# Patient Record
Sex: Female | Born: 1945 | Race: White | Hispanic: No | Smoking: Never smoker
Health system: Southern US, Community
[De-identification: ages and names within clinical notes are randomized; demographics above are authoritative.]

## PROBLEM LIST (undated history)

## (undated) DIAGNOSIS — K219 Gastro-esophageal reflux disease without esophagitis: Secondary | ICD-10-CM

## (undated) DIAGNOSIS — G8929 Other chronic pain: Secondary | ICD-10-CM

## (undated) DIAGNOSIS — I10 Essential (primary) hypertension: Secondary | ICD-10-CM

## (undated) HISTORY — PX: MASTECTOMY: SHX3

## (undated) HISTORY — PX: EYE SURGERY: SHX253

## (undated) HISTORY — PX: SHOULDER SURGERY: SHX246

## (undated) HISTORY — PX: BACK SURGERY: SHX140

## (undated) HISTORY — PX: REPLACEMENT TOTAL KNEE BILATERAL: SUR1225

---

## 2018-11-20 ENCOUNTER — Encounter: Payer: Self-pay | Admitting: Emergency Medicine

## 2018-11-20 ENCOUNTER — Ambulatory Visit
Admission: EM | Admit: 2018-11-20 | Discharge: 2018-11-20 | Disposition: A | Discharge status: Home / Self Care | Payer: Medicare Other | Attending: Family Medicine | Admitting: Family Medicine

## 2018-11-20 ENCOUNTER — Other Ambulatory Visit: Payer: Self-pay

## 2018-11-20 DIAGNOSIS — J0100 Acute maxillary sinusitis, unspecified: Secondary | ICD-10-CM

## 2018-11-20 DIAGNOSIS — Z881 Allergy status to other antibiotic agents status: Secondary | ICD-10-CM | POA: Diagnosis not present

## 2018-11-20 DIAGNOSIS — Z79899 Other long term (current) drug therapy: Secondary | ICD-10-CM | POA: Diagnosis not present

## 2018-11-20 DIAGNOSIS — I10 Essential (primary) hypertension: Secondary | ICD-10-CM | POA: Insufficient documentation

## 2018-11-20 DIAGNOSIS — J0110 Acute frontal sinusitis, unspecified: Secondary | ICD-10-CM | POA: Diagnosis not present

## 2018-11-20 DIAGNOSIS — Z96653 Presence of artificial knee joint, bilateral: Secondary | ICD-10-CM | POA: Diagnosis not present

## 2018-11-20 DIAGNOSIS — Z888 Allergy status to other drugs, medicaments and biological substances status: Secondary | ICD-10-CM | POA: Diagnosis not present

## 2018-11-20 DIAGNOSIS — R519 Headache, unspecified: Secondary | ICD-10-CM | POA: Diagnosis present

## 2018-11-20 DIAGNOSIS — Z20828 Contact with and (suspected) exposure to other viral communicable diseases: Secondary | ICD-10-CM | POA: Diagnosis not present

## 2018-11-20 DIAGNOSIS — Z8249 Family history of ischemic heart disease and other diseases of the circulatory system: Secondary | ICD-10-CM | POA: Diagnosis not present

## 2018-11-20 HISTORY — DX: Gastro-esophageal reflux disease without esophagitis: K21.9

## 2018-11-20 HISTORY — DX: Essential (primary) hypertension: I10

## 2018-11-20 HISTORY — DX: Other chronic pain: G89.29

## 2018-11-20 MED ORDER — AMOXICILLIN 875 MG PO TABS: 875.0000 mg | ORAL_TABLET | Freq: Two times a day (BID) | ORAL | 0 refills | Status: DC

## 2018-11-20 MED ORDER — BENZONATATE 200 MG PO CAPS: 200.0000 mg | ORAL_CAPSULE | Freq: Three times a day (TID) | ORAL | 0 refills | Status: DC | PRN

## 2018-11-20 NOTE — ED Triage Notes (Signed)
Patient c/o cough and chest congestion and HAs for a week.  Patient denies fevers.

## 2018-11-20 NOTE — ED Provider Notes (Signed)
[facility]    CSN: [id number] Arrival date & time: 11/20/18  1059      History   Chief Complaint Chief Complaint  Patient presents with  . Cough  . Headache    HPI Marilyn Keith is a 73 y.o. female.   73 yo female with a c/o sinus pressure, sinus headaches for one to two weeks and slight cough. Denies any fevers, chills, chest pains, shortness of breath, wheezing.      Past Medical History:  Diagnosis Date  . Chronic back pain   . GERD (gastroesophageal reflux disease)   . Hypertension     There are no active problems to display for this patient.   Past Surgical History:  Procedure Laterality Date  . BACK SURGERY    . MASTECTOMY    . REPLACEMENT TOTAL KNEE BILATERAL    . SHOULDER SURGERY Right     OB History   No obstetric history on file.      Home Medications    Prior to Admission medications   Medication Sig Start Date End Date Taking? Authorizing Provider  aspirin-acetaminophen-caffeine (EXCEDRIN MIGRAINE) [id number] MG tablet Take by mouth.   Yes [provider]  Dexlansoprazole 30 MG capsule Take by mouth.   Yes [provider]  DULoxetine (CYMBALTA) 60 MG capsule TAKE ONE CAPSULE BY MOUTH EVERY DAY 01/18/16  Yes [provider]  losartan (COZAAR) 50 MG tablet TAKE 1 TABLET BY MOUTH EVERY DAY 01/17/16  Yes [provider]  OXYGEN Inhale 1 L into the lungs every evening.   Yes [provider]  pregabalin (LYRICA) 100 MG capsule TAKE ONE CAPSULE BY MOUTH 3 TIMES A DAY 11/01/15  Yes [provider]  amoxicillin (AMOXIL) 875 MG tablet Take 1 tablet (875 mg total) by mouth 2 (two) times daily. 11/20/18   Norval Gable, MD  benzonatate (TESSALON) 200 MG capsule Take 1 capsule (200 mg total) by mouth 3 (three) times daily as needed. 11/20/18   Norval Gable, MD    Family History Family History  Problem Relation Age of Onset  . Heart disease Father     Social History Social History   Tobacco Use  . Smoking status: Never Smoker  . Smokeless tobacco: Never Used  Substance Use Topics  . Alcohol use: Yes  . Drug use: Never     Allergies   Pregabalin and Ciprofloxacin   Review of Systems Review of Systems   Physical Exam Triage Vital Signs ED Triage Vitals  Enc Vitals Group     BP 11/20/18 1117 122/73     Pulse Rate 11/20/18 1117 85     Resp 11/20/18 1117 16     Temp 11/20/18 1117 98.5 F (36.9 C)     Temp Source 11/20/18 1117 Oral     SpO2 11/20/18 1117 98 %     Weight 11/20/18 1111 177 lb (80.3 kg)     Height 11/20/18 1111 4\' 11"  (1.499 m)     Head Circumference --      Peak Flow --      Pain Score 11/20/18 1111 3     Pain Loc --      Pain Edu? --      Excl. in [place]? --    No data found.  Updated Vital Signs BP 122/73 (BP Location: Left Arm)   Pulse 85   Temp 98.5 F (36.9 C) (Oral)   Resp 16   Ht 4\' 11"  (1.499 m)   Wt  80.3 kg   SpO2 98%   BMI 35.75 kg/m   Visual Acuity Right Eye Distance:   Left Eye Distance:   Bilateral Distance:    Right Eye Near:   Left Eye Near:    Bilateral Near:     Physical Exam Vitals signs and nursing note reviewed.  Constitutional:      General: She is not in acute distress.    Appearance: She is not toxic-appearing or diaphoretic.  HENT:     Nose: Congestion present.     Right Sinus: Maxillary sinus tenderness and frontal sinus tenderness present.     Left Sinus: Maxillary sinus tenderness and frontal sinus tenderness present.  Cardiovascular:     Rate and Rhythm: Normal rate.  Pulmonary:     Effort: Pulmonary effort is normal. No respiratory distress.  Neurological:     Mental Status: She is alert.      UC Treatments / Results  Labs (all labs ordered are listed, but only abnormal results are displayed) Labs Reviewed  NOVEL CORONAVIRUS, NAA (HOSPITAL ORDER, SEND-OUT TO REF LAB)    EKG   Radiology No results found.  Procedures Procedures (including critical care time)   Medications Ordered in UC Medications - No data to display  Initial Impression / Assessment and Plan / UC Course  I have reviewed the triage vital signs and the nursing notes.  Pertinent labs & imaging results that were available during my care of the patient were reviewed by me and considered in my medical decision making (see chart for details).      Final Clinical Impressions(s) / UC Diagnoses   Final diagnoses:  Acute maxillary sinusitis, recurrence not specified  Acute frontal sinusitis, recurrence not specified     Discharge Instructions     Rest, fluids, over the counter tylenol/advil as needed Await test result    ED Prescriptions    Medication Sig Dispense Auth. Provider   benzonatate (TESSALON) 200 MG capsule Take 1 capsule (200 mg total) by mouth 3 (three) times daily as needed. 30 capsule Payton Mccallum, MD   amoxicillin (AMOXIL) 875 MG tablet Take 1 tablet (875 mg total) by mouth 2 (two) times daily. 20 tablet Payton Mccallum, MD      1.  diagnosis reviewed with patient 2. rx as per orders above; reviewed possible side effects, interactions, risks and benefits  3. Recommend supportive treatment as above 4. Follow-up prn if symptoms worsen or don't improve PDMP not reviewed this encounter.   Payton Mccallum, MD 12/03/18 1134

## 2018-11-20 NOTE — Discharge Instructions (Signed)
Rest, fluids, over the counter tylenol/advil as needed °Await test result °

## 2018-11-21 LAB — NOVEL CORONAVIRUS, NAA (HOSP ORDER, SEND-OUT TO REF LAB; TAT 18-24 HRS): SARS-CoV-2, NAA: NOT DETECTED

## 2018-12-14 ENCOUNTER — Encounter: Payer: Self-pay | Admitting: Emergency Medicine

## 2018-12-14 ENCOUNTER — Ambulatory Visit
Admission: EM | Admit: 2018-12-14 | Discharge: 2018-12-14 | Disposition: A | Discharge status: Home / Self Care | Payer: Medicare Other | Attending: Emergency Medicine | Admitting: Emergency Medicine

## 2018-12-14 ENCOUNTER — Other Ambulatory Visit: Payer: Self-pay

## 2018-12-14 DIAGNOSIS — R319 Hematuria, unspecified: Secondary | ICD-10-CM | POA: Diagnosis present

## 2018-12-14 DIAGNOSIS — N390 Urinary tract infection, site not specified: Secondary | ICD-10-CM | POA: Diagnosis present

## 2018-12-14 LAB — URINALYSIS, COMPLETE (UACMP) WITH MICROSCOPIC
Bilirubin Urine: NEGATIVE
Glucose, UA: NEGATIVE mg/dL
Nitrite: POSITIVE — AB
Protein, ur: 30 mg/dL — AB
Specific Gravity, Urine: 1.025 (ref 1.005–1.030)
WBC, UA: >50 WBC/hpf (ref 0–5)
pH: 6 (ref 5.0–8.0)

## 2018-12-14 MED ORDER — CEPHALEXIN 500 MG PO CAPS: 500.0000 mg | ORAL_CAPSULE | Freq: Two times a day (BID) | ORAL | 0 refills | Status: AC

## 2018-12-14 NOTE — Discharge Instructions (Addendum)
Take medication as prescribed. Rest. Drink plenty of fluids.  ° °Follow up with your primary care physician this week as needed. Return to Urgent care for new or worsening concerns.  ° °

## 2018-12-14 NOTE — ED Triage Notes (Signed)
Patient in today c/o urinary frequency and burning x 4 days. Patient denies vaginal discharge. Patient denies fever.

## 2018-12-14 NOTE — ED Provider Notes (Signed)
[facility] ____________________________________________  Time seen: Approximately 11:18 AM  I have reviewed the triage vital signs and the nursing notes.   HISTORY  Chief Complaint Dysuria   HPI Marilyn Keith is a 73 y.o. female presenting for evaluation of 4 days of urinary frequency, urgency and burning with urination.  States feels consistent with previous urinary infections.  Has been trying over-the-counter vitamin C and cranberry juice without resolution.  Denies abdominal pain, flank pain, fevers, vomiting, diarrhea or known triggers.  Reports otherwise doing well.  Denies aggravating alleviating factors.  Denies renal insufficiency history.   Past Medical History:  Diagnosis Date  . Chronic back pain   . GERD (gastroesophageal reflux disease)   . Hypertension     There are no active problems to display for this patient.   Past Surgical History:  Procedure Laterality Date  . BACK SURGERY    . MASTECTOMY    . REPLACEMENT TOTAL KNEE BILATERAL    . SHOULDER SURGERY Right      No current facility-administered medications for this encounter.   Current Outpatient Medications:  .  aspirin-acetaminophen-caffeine (EXCEDRIN MIGRAINE) 250-250-65 MG tablet, Take by mouth., Disp: , Rfl:  .  Dexlansoprazole 30 MG capsule, Take by mouth., Disp: , Rfl:  .  diphenhydrAMINE (BENADRYL) 25 MG tablet, Take 25 mg by mouth daily as needed., Disp: , Rfl:  .  DULoxetine (CYMBALTA) 60 MG capsule, TAKE ONE CAPSULE BY MOUTH EVERY DAY, Disp: , Rfl:  .  losartan (COZAAR) 50 MG tablet, TAKE 1 TABLET BY MOUTH EVERY DAY, Disp: , Rfl:  .  OXYGEN, Inhale 1 L into the lungs every evening., Disp: , Rfl:  .  pregabalin (LYRICA) 100 MG capsule, TAKE ONE CAPSULE BY MOUTH 3 TIMES A DAY, Disp: , Rfl:  .  cephALEXin (KEFLEX) 500 MG capsule, Take 1 capsule (500 mg total) by mouth 2 (two) times daily for 7 days., Disp: 14 capsule, Rfl: 0  Allergies Pregabalin and Ciprofloxacin  Family  History  Problem Relation Age of Onset  . Other Mother        septic from bladder infection  . Heart disease Father   . Diabetes Father     Social History Social History   Tobacco Use  . Smoking status: Never Smoker  . Smokeless tobacco: Never Used  Substance Use Topics  . Alcohol use: Yes    Comment: rarely  . Drug use: Never    Review of Systems Constitutional: No fever ENT: No sore throat. Cardiovascular: Denies chest pain. Respiratory: Denies shortness of breath. Gastrointestinal: No abdominal pain.  No nausea, no vomiting.  No diarrhea.  Genitourinary: Positive for dysuria. Musculoskeletal: Negative for back pain. Skin: Negative for rash.   ____________________________________________   PHYSICAL EXAM:  VITAL SIGNS: ED Triage Vitals  Enc Vitals Group     BP 12/14/18 1040 136/72     Pulse Rate 12/14/18 1040 88     Resp 12/14/18 1040 18     Temp 12/14/18 1040 97.9 F (36.6 C)     Temp Source 12/14/18 1040 Oral     SpO2 12/14/18 1040 99 %     Weight 12/14/18 1041 176 lb (79.8 kg)     Height 12/14/18 1041 4\' 11"  (1.499 m)     Head Circumference --      Peak Flow --      Pain Score 12/14/18 1040 8     Pain Loc --      Pain Edu? --  Excl. in [place]? --     Constitutional: Alert and oriented. Well appearing and in no acute distress. Eyes: Conjunctivae are normal.  ENT      Head: Normocephalic and atraumatic. Cardiovascular: Normal rate, regular rhythm. Grossly normal heart sounds.  Good peripheral circulation. Respiratory: Normal respiratory effort without tachypnea nor retractions. Breath sounds are clear and equal bilaterally. No wheezes, rales, rhonchi. Gastrointestinal: Soft and nontender.  No CVA tenderness. Musculoskeletal:  Steady gait.  Neurologic:  Normal speech and language. Speech is normal. No gait instability.  Skin:  Skin is warm, dry and intact. No rash noted. Psychiatric: Mood and affect are normal. Speech and behavior are normal. Patient  exhibits appropriate insight and judgment   ___________________________________________   LABS (all labs ordered are listed, but only abnormal results are displayed)  Labs Reviewed  URINALYSIS, COMPLETE (UACMP) WITH MICROSCOPIC - Abnormal; Notable for the following components:      Result Value   APPearance CLOUDY (*)    Hgb urine dipstick MODERATE (*)    Ketones, ur TRACE (*)    Protein, ur 30 (*)    Nitrite POSITIVE (*)    Leukocytes,Ua SMALL (*)    Bacteria, UA MANY (*)    All other components within normal limits  URINE CULTURE   ____________________________________________  PROCEDURES Procedures    INITIAL IMPRESSION / ASSESSMENT AND PLAN / ED COURSE  Pertinent labs & imaging results that were available during my care of the patient were reviewed by me and considered in my medical decision making (see chart for details).  Well appearing.  No acute distress.  Urinalysis reviewed, UTI.  Will treat with oral Keflex.  Encourage rest, fluids and supportive care.Discussed indication, risks and benefits of medications with patient.   Discussed follow up with Primary care physician this week. Discussed follow up and return parameters including no resolution or any worsening concerns. Patient verbalized understanding and agreed to plan.   ____________________________________________   FINAL CLINICAL IMPRESSION(S) / ED DIAGNOSES  Final diagnoses:  Urinary tract infection with hematuria, site unspecified     ED Discharge Orders         Ordered    cephALEXin (KEFLEX) 500 MG capsule  2 times daily     12/14/18 1122           Note: This dictation was prepared with Dragon dictation along with smaller phrase technology. Any transcriptional errors that result from this process are unintentional.         Marylene Land, NP 12/14/18 1236

## 2018-12-17 LAB — URINE CULTURE: Culture: >=100000 — AB

## 2019-02-07 ENCOUNTER — Other Ambulatory Visit: Payer: Self-pay

## 2019-02-07 ENCOUNTER — Ambulatory Visit
Admission: EM | Admit: 2019-02-07 | Discharge: 2019-02-07 | Disposition: A | Discharge status: Home / Self Care | Payer: Medicare Other | Attending: Family Medicine | Admitting: Family Medicine

## 2019-02-07 DIAGNOSIS — T148XXA Other injury of unspecified body region, initial encounter: Secondary | ICD-10-CM | POA: Diagnosis not present

## 2019-02-07 NOTE — ED Triage Notes (Signed)
Pt presents with c/o bruise to inner and outer aspect of right leg. She denies any known injury/fall. She did have knee surgery 2008(?). She is not sure what could have happened. She does have abraded area around the outside of her right ankle, she states due to itching/scratching and rashes she gets. She reports the leg is "uncomfortable" but denies pain. She was having some muscle spasms in that leg yesterday.

## 2019-02-07 NOTE — Discharge Instructions (Addendum)
Monitor for any new symptoms and follow up as needed

## 2019-02-09 NOTE — ED Provider Notes (Signed)
[facility]    CSN: [id number] Arrival date & time: 02/07/19  1352      History   Chief Complaint Chief Complaint  Patient presents with  . Bruising    HPI Marilyn Keith is a 74 y.o. female.   74 yo female with a c/o bruising to her right leg for the past 4-5 days. Denies any pain, falls or other traumatic injury, swelling, fevers, chills, chest pains or shortness of breath. Reports she takes excedrin/aspirin daily and the only thing she can think of was massaging some lotion on her legs prior to symptoms developing.      Past Medical History:  Diagnosis Date  . Chronic back pain   . GERD (gastroesophageal reflux disease)   . Hypertension     There are no problems to display for this patient.   Past Surgical History:  Procedure Laterality Date  . BACK SURGERY    . MASTECTOMY    . REPLACEMENT TOTAL KNEE BILATERAL    . SHOULDER SURGERY Right     OB History   No obstetric history on file.      Home Medications    Prior to Admission medications   Medication Sig Start Date End Date Taking? Authorizing Provider  aspirin-acetaminophen-caffeine (EXCEDRIN MIGRAINE) [id number] MG tablet Take by mouth.   Yes [provider]  Dexlansoprazole 30 MG capsule Take by mouth.   Yes [provider]  diphenhydrAMINE (BENADRYL) 25 MG tablet Take 25 mg by mouth daily as needed.   Yes [provider]  DULoxetine (CYMBALTA) 60 MG capsule TAKE ONE CAPSULE BY MOUTH EVERY DAY 01/18/16  Yes [provider]  losartan (COZAAR) 50 MG tablet TAKE 1 TABLET BY MOUTH EVERY DAY 01/17/16  Yes [provider]  OXYGEN Inhale 1 L into the lungs every evening.   Yes [provider]  pregabalin (LYRICA) 100 MG capsule TAKE ONE CAPSULE BY MOUTH 3 TIMES A DAY 11/01/15  Yes [provider]    Family History Family History  Problem Relation Age of Onset  . Other Mother        septic from bladder infection  . Heart disease  Father   . Diabetes Father     Social History Social History   Tobacco Use  . Smoking status: Never Smoker  . Smokeless tobacco: Never Used  Substance Use Topics  . Alcohol use: Yes    Comment: rarely  . Drug use: Never     Allergies   Pregabalin and Ciprofloxacin   Review of Systems Review of Systems   Physical Exam Triage Vital Signs ED Triage Vitals  Enc Vitals Group     BP 02/07/19 1411 114/79     Pulse Rate 02/07/19 1411 89     Resp --      Temp 02/07/19 1411 98.4 F (36.9 C)     Temp Source 02/07/19 1411 Oral     SpO2 02/07/19 1411 100 %     Weight 02/07/19 1409 175 lb (79.4 kg)     Height 02/07/19 1409 4\' 11"  (1.499 m)     Head Circumference --      Peak Flow --      Pain Score 02/07/19 1408 5     Pain Loc --      Pain Edu? --      Excl. in [place]? --    No data found.  Updated Vital Signs BP 114/79 (BP Location: Left Arm)   Pulse 89  Temp 98.4 F (36.9 C) (Oral)   Ht 4\' 11"  (1.499 m)   Wt 79.4 kg   SpO2 100%   BMI 35.35 kg/m   Visual Acuity Right Eye Distance:   Left Eye Distance:   Bilateral Distance:    Right Eye Near:   Left Eye Near:    Bilateral Near:     Physical Exam Vitals and nursing note reviewed.  Constitutional:      General: She is not in acute distress.    Appearance: She is not toxic-appearing or diaphoretic.  Cardiovascular:     Rate and Rhythm: Normal rate.  Pulmonary:     Effort: Pulmonary effort is normal. No respiratory distress.  Musculoskeletal:        General: No tenderness.     Comments: Right lower extremity neurovascularly intact; 2+ pulses; skin on medial lower thigh and knee area with mild ecchymosis noted; no tenderness  Neurological:     Mental Status: She is alert.      UC Treatments / Results  Labs (all labs ordered are listed, but only abnormal results are displayed) Labs Reviewed - No data to display  EKG   Radiology No results found.  Procedures Procedures (including critical care  time)  Medications Ordered in UC Medications - No data to display  Initial Impression / Assessment and Plan / UC Course  I have reviewed the triage vital signs and the nursing notes.  Pertinent labs & imaging results that were available during my care of the patient were reviewed by me and considered in my medical decision making (see chart for details).      Final Clinical Impressions(s) / UC Diagnoses   Final diagnoses:  Bruising     Discharge Instructions     Monitor for any new symptoms and follow up as needed    ED Prescriptions    None     1. diagnosis reviewed with patient 2. Recommend supportive treatment; monitor 3. Follow-up prn if symptoms worsen or don't improve  PDMP not reviewed this encounter.   , MD 02/09/19 1000

## 2020-05-17 ENCOUNTER — Ambulatory Visit
Admission: EM | Admit: 2020-05-17 | Discharge: 2020-05-17 | Disposition: A | Discharge status: Home / Self Care | Payer: Medicare Other | Attending: Sports Medicine | Admitting: Sports Medicine

## 2020-05-17 ENCOUNTER — Other Ambulatory Visit: Payer: Self-pay

## 2020-05-17 ENCOUNTER — Ambulatory Visit (INDEPENDENT_AMBULATORY_CARE_PROVIDER_SITE_OTHER): Payer: Medicare Other

## 2020-05-17 DIAGNOSIS — Z96653 Presence of artificial knee joint, bilateral: Secondary | ICD-10-CM | POA: Insufficient documentation

## 2020-05-17 DIAGNOSIS — J019 Acute sinusitis, unspecified: Secondary | ICD-10-CM | POA: Insufficient documentation

## 2020-05-17 DIAGNOSIS — R059 Cough, unspecified: Secondary | ICD-10-CM | POA: Insufficient documentation

## 2020-05-17 DIAGNOSIS — Z20822 Contact with and (suspected) exposure to covid-19: Secondary | ICD-10-CM | POA: Diagnosis not present

## 2020-05-17 DIAGNOSIS — Z881 Allergy status to other antibiotic agents status: Secondary | ICD-10-CM | POA: Diagnosis not present

## 2020-05-17 DIAGNOSIS — Z79899 Other long term (current) drug therapy: Secondary | ICD-10-CM | POA: Insufficient documentation

## 2020-05-17 DIAGNOSIS — J45909 Unspecified asthma, uncomplicated: Secondary | ICD-10-CM | POA: Diagnosis not present

## 2020-05-17 DIAGNOSIS — R0602 Shortness of breath: Secondary | ICD-10-CM

## 2020-05-17 MED ORDER — IPRATROPIUM BROMIDE 0.06 % NA SOLN: 2.0000 | Freq: Four times a day (QID) | NASAL | 12 refills | Status: AC

## 2020-05-17 MED ORDER — AMOXICILLIN-POT CLAVULANATE 875-125 MG PO TABS: 1.0000 | ORAL_TABLET | Freq: Two times a day (BID) | ORAL | 0 refills | Status: AC

## 2020-05-17 MED ORDER — BENZONATATE 200 MG PO CAPS: 200.0000 mg | ORAL_CAPSULE | Freq: Three times a day (TID) | ORAL | 0 refills | Status: AC | PRN

## 2020-05-17 NOTE — ED Triage Notes (Signed)
Pt reports having productive cough x2 weeks. sts she have been feeling SOB. Also reports having nasal pressure and headache.

## 2020-05-17 NOTE — Discharge Instructions (Addendum)
The chest x-ray is negative for pneumonia.  Your vital signs are stable.  Your oxygen is normal and your chest is clear when I listen to.  I suspect that you have a viral infection and there is a possibility that you have a secondary sinus infection since her symptoms have been ongoing for 2 weeks.  We can try an antibiotic at this time, medication for cough and a nasal spray.  Make sure to rest and increase fluids.  You should be seen again either by your PCP or here or ED if you develop any fever, worsening cough or breathing difficulty.  Also, use your albuterol inhaler if you are feeling short of breath.  You have received COVID testing today either for positive exposure, concerning symptoms that could be related to COVID infection, screening purposes, or re-testing after confirmed positive.  Your test obtained today checks for active viral infection in the last 1-2 weeks. If your test is negative now, you can still test positive later. So, if you do develop symptoms you should either get re-tested and/or isolate x 5 days and then strict mask use x 5 days (unvaccinated) or mask use x 10 days (vaccinated). Please follow CDC guidelines.  While Rapid antigen tests come back in 15-20 minutes, send out PCR/molecular test results typically come back within 1-3 days. In the mean time, if you are symptomatic, assume this could be a positive test and treat/monitor yourself as if you do have COVID.   We will call with test results if positive. Please download the MyChart app and set up a profile to access test results.   If symptomatic, go home and rest. Push fluids. Take Tylenol as needed for discomfort. Gargle warm salt water. Throat lozenges. Take Mucinex DM or Robitussin for cough. Humidifier in bedroom to ease coughing. Warm showers. Also review the COVID handout for more information.  COVID-19 INFECTION: The incubation period of COVID-19 is approximately 14 days after exposure, with most symptoms  developing in roughly 4-5 days. Symptoms may range in severity from mild to critically severe. Roughly 80% of those infected will have mild symptoms. People of any age may become infected with COVID-19 and have the ability to transmit the virus. The most common symptoms include: fever, fatigue, cough, body aches, headaches, sore throat, nasal congestion, shortness of breath, nausea, vomiting, diarrhea, changes in smell and/or taste.    COURSE OF ILLNESS Some patients may begin with mild disease which can progress quickly into critical symptoms. If your symptoms are worsening please call ahead to the Emergency Department and proceed there for further treatment. Recovery time appears to be roughly 1-2 weeks for mild symptoms and 3-6 weeks for severe disease.   GO IMMEDIATELY TO ER FOR FEVER YOU ARE UNABLE TO GET DOWN WITH TYLENOL, BREATHING PROBLEMS, CHEST PAIN, FATIGUE, LETHARGY, INABILITY TO EAT OR DRINK, ETC  QUARANTINE AND ISOLATION: To help decrease the spread of COVID-19 please remain isolated if you have COVID infection or are highly suspected to have COVID infection. This means -stay home and isolate to one room in the home if you live with others. Do not share a bed or bathroom with others while ill, sanitize and wipe down all countertops and keep common areas clean and disinfected. Stay home for 5 days. If you have no symptoms or your symptoms are resolving after 5 days, you can leave your house. Continue to wear a mask around others for 5 additional days. If you have been in close contact (within  6 feet) of someone diagnosed with COVID 19, you are advised to quarantine in your home for 14 days as symptoms can develop anywhere from 2-14 days after exposure to the virus. If you develop symptoms, you  must isolate.  Most current guidelines for COVID after exposure -unvaccinated: isolate 5 days and strict mask use x 5 days. Test on day 5 is possible -vaccinated: wear mask x 10 days if symptoms do not  develop -You do not necessarily need to be tested for COVID if you have + exposure and  develop symptoms. Just isolate at home x10 days from symptom onset During this global pandemic, CDC advises to practice social distancing, try to stay at least 63ft away from others at all times. Wear a face covering. Wash and sanitize your hands regularly and avoid going anywhere that is not necessary.  KEEP IN MIND THAT THE COVID TEST IS NOT 100% ACCURATE AND YOU SHOULD STILL DO EVERYTHING TO PREVENT POTENTIAL SPREAD OF VIRUS TO OTHERS (WEAR MASK, WEAR GLOVES, WASH HANDS AND SANITIZE REGULARLY). IF INITIAL TEST IS NEGATIVE, THIS MAY NOT MEAN YOU ARE DEFINITELY NEGATIVE. MOST ACCURATE TESTING IS DONE 5-7 DAYS AFTER EXPOSURE.   It is not advised by CDC to get re-tested after receiving a positive COVID test since you can still test positive for weeks to months after you have already cleared the virus.   *If you have not been vaccinated for COVID, I strongly suggest you consider getting vaccinated as long as there are no contraindications.

## 2020-05-17 NOTE — ED Provider Notes (Signed)
[facility]    CSN: [id number] Arrival date & time: 05/17/20  1042      History   Chief Complaint Chief Complaint  Patient presents with  . Cough    HPI Marilyn Keith is a 75 y.o. female presenting for 2-week history of fatigue, productive cough, nasal congestion, headaches and sinus pressure.  Patient also states that she has some shortness of breath which is worse whenever she exerts herself.  No chest pain.  No fevers.  Patient states that she believes she picked up an illness from her grandson who was sick around the same time.  She says he is better but she is not.  She states she does not feel as bad as she did at the start but she still feels unwell.  Patient has not been evaluated for the symptoms yet.  She has not been taking any OTC medications for symptoms.  She has no history of asthma, COPD or severe allergies.  She does see a pulmonologist for reactive airway disease associated with severe GERD and obstructive sleep apnea.  Patient has albuterol inhaler to use as needed but she says she has not used it.  No known COVID exposure and patient vaccinated for COVID-19 x3.  No other concerns.  HPI  Past Medical History:  Diagnosis Date  . Chronic back pain   . GERD (gastroesophageal reflux disease)   . Hypertension     There are no problems to display for this patient.   Past Surgical History:  Procedure Laterality Date  . BACK SURGERY    . EYE SURGERY    . MASTECTOMY    . REPLACEMENT TOTAL KNEE BILATERAL    . SHOULDER SURGERY Right     OB History   No obstetric history on file.      Home Medications    Prior to Admission medications   Medication Sig Start Date End Date Taking? Authorizing Provider  amoxicillin-clavulanate (AUGMENTIN) 875-125 MG tablet Take 1 tablet by mouth every 12 (twelve) hours for 7 days. 05/17/20 05/24/20 Yes Shirlee Latch, PA-C  benzonatate (TESSALON) 200 MG capsule Take 1 capsule (200 mg total) by mouth 3 (three) times  daily as needed for up to 7 days for cough. 05/17/20 05/24/20 Yes Eusebio Friendly B, PA-C  ipratropium (ATROVENT) 0.06 % nasal spray Place 2 sprays into both nostrils 4 (four) times daily. 05/17/20  Yes Shirlee Latch, PA-C  aspirin-acetaminophen-caffeine (EXCEDRIN MIGRAINE) [phone] MG tablet Take by mouth.    [provider]  Dexlansoprazole 30 MG capsule Take by mouth.    [provider]  diphenhydrAMINE (BENADRYL) 25 MG tablet Take 25 mg by mouth daily as needed.    [provider]  DULoxetine (CYMBALTA) 60 MG capsule TAKE ONE CAPSULE BY MOUTH EVERY DAY 01/18/16   [provider]  losartan (COZAAR) 50 MG tablet TAKE 1 TABLET BY MOUTH EVERY DAY 01/17/16   [provider]  OXYGEN Inhale 1 L into the lungs every evening.    [provider]  pregabalin (LYRICA) 100 MG capsule TAKE ONE CAPSULE BY MOUTH 3 TIMES A DAY 11/01/15   [provider]    Family History Family History  Problem Relation Age of Onset  . Other Mother        septic from bladder infection  . Heart disease Father   . Diabetes Father     Social History Social History   Tobacco Use  . Smoking status: Never Smoker  . Smokeless  tobacco: Never Used  Vaping Use  . Vaping Use: Never used  Substance Use Topics  . Alcohol use: Yes    Comment: rarely  . Drug use: Never     Allergies   Pregabalin and Ciprofloxacin   Review of Systems Review of Systems  Constitutional: Negative for chills, diaphoresis, fatigue and fever.  HENT: Positive for congestion, rhinorrhea, sinus pressure and sinus pain. Negative for ear pain and sore throat.   Respiratory: Positive for cough and shortness of breath. Negative for wheezing.   Cardiovascular: Negative for chest pain.  Gastrointestinal: Negative for abdominal pain, nausea and vomiting.  Musculoskeletal: Negative for arthralgias and myalgias.  Skin: Negative for rash.  Neurological: Positive for headaches. Negative for  weakness.  Hematological: Negative for adenopathy.     Physical Exam Triage Vital Signs ED Triage Vitals  Enc Vitals Group     BP 05/17/20 1051 (!) 150/89     Pulse Rate 05/17/20 1051 96     Resp 05/17/20 1051 20     Temp 05/17/20 1051 97.9 F (36.6 C)     Temp Source 05/17/20 1051 Oral     SpO2 05/17/20 1051 99 %     Weight 05/17/20 1052 180 lb (81.6 kg)     Height 05/17/20 1052 4\' 11"  (1.499 m)     Head Circumference --      Peak Flow --      Pain Score 05/17/20 1052 2     Pain Loc --      Pain Edu? --      Excl. in GC? --    No data found.  Updated Vital Signs BP (!) 150/89   Pulse 96   Temp 97.9 F (36.6 C) (Oral)   Resp 20   Ht 4\' 11"  (1.499 m)   Wt 180 lb (81.6 kg)   SpO2 99%   BMI 36.36 kg/m       Physical Exam Vitals and nursing note reviewed.  Constitutional:      General: She is not in acute distress.    Appearance: Normal appearance. She is not ill-appearing or toxic-appearing.  HENT:     Head: Normocephalic and atraumatic.     Right Ear: Tympanic membrane, ear canal and external ear normal.     Left Ear: Tympanic membrane, ear canal and external ear normal.     Nose: Congestion and rhinorrhea present.     Mouth/Throat:     Mouth: Mucous membranes are moist.     Pharynx: Oropharynx is clear. No posterior oropharyngeal erythema.  Eyes:     General: No scleral icterus.       Right eye: No discharge.        Left eye: No discharge.     Conjunctiva/sclera: Conjunctivae normal.  Cardiovascular:     Rate and Rhythm: Normal rate and regular rhythm.     Heart sounds: Normal heart sounds.  Pulmonary:     Effort: Pulmonary effort is normal. No respiratory distress.     Breath sounds: Normal breath sounds. No wheezing, rhonchi or rales.  Musculoskeletal:     Cervical back: Neck supple.  Skin:    General: Skin is dry.  Neurological:     General: No focal deficit present.     Mental Status: She is alert. Mental status is at baseline.     Motor: No  weakness.     Gait: Gait normal.  Psychiatric:        Mood and Affect: Mood normal.  Behavior: Behavior normal.        Thought Content: Thought content normal.      UC Treatments / Results  Labs (all labs ordered are listed, but only abnormal results are displayed) Labs Reviewed  SARS CORONAVIRUS 2 (TAT 6-24 HRS)    EKG   Radiology DG Chest 2 View  Addendum Date: 05/17/2020   ADDENDUM REPORT: 05/17/2020 12:10 ADDENDUM: The impression should read as follows: No acute abnormality. Electronically Signed   By: Beckie Salts M.D.   On: 05/17/2020 12:10   Result Date: 05/17/2020 CLINICAL DATA:  Cough and shortness of breath for the past 2 weeks. EXAM: CHEST - 2 VIEW COMPARISON:  None. FINDINGS: Borderline enlarged cardiac silhouette. Clear lungs. The patient's chin is obscuring the medial lung apices. Extensive spinal fixation hardware. IMPRESSION: Acute abnormality. Electronically Signed: By: Beckie Salts M.D. On: 05/17/2020 11:54    Procedures Procedures (including critical care time)  Medications Ordered in UC Medications - No data to display  Initial Impression / Assessment and Plan / UC Course  I have reviewed the triage vital signs and the nursing notes.  Pertinent labs & imaging results that were available during my care of the patient were reviewed by me and considered in my medical decision making (see chart for details).   75 year old female presenting for 2-week history of cough, congestion and feeling short of breath.  Vital signs are all stable today.  Her oxygen is 99%.  She is overall well-appearing.  She has nasal congestion and rhinorrhea on exam.  Her chest is clear to auscultation and heart regular rate and rhythm.  Obtaining chest x-ray due to complaint of shortness of breath and duration of symptoms.  Patient also reports history of pneumonia and concern for that.  COVID test obtained.  Current CDC guidelines, isolation protocol and ED precautions reviewed  with patient.  X-ray independently reviewed by me.  X-ray interpretation is within normal limits.  There is no acute abnormality.  The over read says "acute abnormality."  I called the radiologist and spoke with Beckie Salts and he states that he meant for her to say "no acute abnormality."  He says that he will change it and send over the new report.  Suspect patient has viral illness.  Suspect possible secondary infection.  Treating at this time with Augmentin, benzonatate, and Atrovent nasal spray.  Encouraged increased rest and fluids and to use her albuterol inhaler if she needs for any shortness of breath.  Advised to follow-up with PCP if not improving but to return here or go to ED for any acute worsening of symptoms.  Final Clinical Impressions(s) / UC Diagnoses   Final diagnoses:  Acute sinusitis, recurrence not specified, unspecified location  Cough  Shortness of breath     Discharge Instructions     The chest x-ray is negative for pneumonia.  Your vital signs are stable.  Your oxygen is normal and your chest is clear when I listen to.  I suspect that you have a viral infection and there is a possibility that you have a secondary sinus infection since her symptoms have been ongoing for 2 weeks.  We can try an antibiotic at this time, medication for cough and a nasal spray.  Make sure to rest and increase fluids.  You should be seen again either by your PCP or here or ED if you develop any fever, worsening cough or breathing difficulty.  Also, use your albuterol inhaler if you are feeling short  of breath.  You have received COVID testing today either for positive exposure, concerning symptoms that could be related to COVID infection, screening purposes, or re-testing after confirmed positive.  Your test obtained today checks for active viral infection in the last 1-2 weeks. If your test is negative now, you can still test positive later. So, if you do develop symptoms you should either  get re-tested and/or isolate x 5 days and then strict mask use x 5 days (unvaccinated) or mask use x 10 days (vaccinated). Please follow CDC guidelines.  While Rapid antigen tests come back in 15-20 minutes, send out PCR/molecular test results typically come back within 1-3 days. In the mean time, if you are symptomatic, assume this could be a positive test and treat/monitor yourself as if you do have COVID.   We will call with test results if positive. Please download the MyChart app and set up a profile to access test results.   If symptomatic, go home and rest. Push fluids. Take Tylenol as needed for discomfort. Gargle warm salt water. Throat lozenges. Take Mucinex DM or Robitussin for cough. Humidifier in bedroom to ease coughing. Warm showers. Also review the COVID handout for more information.  COVID-19 INFECTION: The incubation period of COVID-19 is approximately 14 days after exposure, with most symptoms developing in roughly 4-5 days. Symptoms may range in severity from mild to critically severe. Roughly 80% of those infected will have mild symptoms. People of any age may become infected with COVID-19 and have the ability to transmit the virus. The most common symptoms include: fever, fatigue, cough, body aches, headaches, sore throat, nasal congestion, shortness of breath, nausea, vomiting, diarrhea, changes in smell and/or taste.    COURSE OF ILLNESS Some patients may begin with mild disease which can progress quickly into critical symptoms. If your symptoms are worsening please call ahead to the Emergency Department and proceed there for further treatment. Recovery time appears to be roughly 1-2 weeks for mild symptoms and 3-6 weeks for severe disease.   GO IMMEDIATELY TO ER FOR FEVER YOU ARE UNABLE TO GET DOWN WITH TYLENOL, BREATHING PROBLEMS, CHEST PAIN, FATIGUE, LETHARGY, INABILITY TO EAT OR DRINK, ETC  QUARANTINE AND ISOLATION: To help decrease the spread of COVID-19 please remain  isolated if you have COVID infection or are highly suspected to have COVID infection. This means -stay home and isolate to one room in the home if you live with others. Do not share a bed or bathroom with others while ill, sanitize and wipe down all countertops and keep common areas clean and disinfected. Stay home for 5 days. If you have no symptoms or your symptoms are resolving after 5 days, you can leave your house. Continue to wear a mask around others for 5 additional days. If you have been in close contact (within 6 feet) of someone diagnosed with COVID 19, you are advised to quarantine in your home for 14 days as symptoms can develop anywhere from 2-14 days after exposure to the virus. If you develop symptoms, you  must isolate.  Most current guidelines for COVID after exposure -unvaccinated: isolate 5 days and strict mask use x 5 days. Test on day 5 is possible -vaccinated: wear mask x 10 days if symptoms do not develop -You do not necessarily need to be tested for COVID if you have + exposure and  develop symptoms. Just isolate at home x10 days from symptom onset During this global pandemic, CDC advises to practice social distancing, try  to stay at least 4ft away from others at all times. Wear a face covering. Wash and sanitize your hands regularly and avoid going anywhere that is not necessary.  KEEP IN MIND THAT THE COVID TEST IS NOT 100% ACCURATE AND YOU SHOULD STILL DO EVERYTHING TO PREVENT POTENTIAL SPREAD OF VIRUS TO OTHERS (WEAR MASK, WEAR GLOVES, WASH HANDS AND SANITIZE REGULARLY). IF INITIAL TEST IS NEGATIVE, THIS MAY NOT MEAN YOU ARE DEFINITELY NEGATIVE. MOST ACCURATE TESTING IS DONE 5-7 DAYS AFTER EXPOSURE.   It is not advised by CDC to get re-tested after receiving a positive COVID test since you can still test positive for weeks to months after you have already cleared the virus.   *If you have not been vaccinated for COVID, I strongly suggest you consider getting vaccinated as  long as there are no contraindications.      ED Prescriptions    Medication Sig Dispense Auth. Provider   benzonatate (TESSALON) 200 MG capsule Take 1 capsule (200 mg total) by mouth 3 (three) times daily as needed for up to 7 days for cough. 20 capsule Eusebio Friendly B, PA-C   ipratropium (ATROVENT) 0.06 % nasal spray Place 2 sprays into both nostrils 4 (four) times daily. 15 mL Eusebio Friendly B, PA-C   amoxicillin-clavulanate (AUGMENTIN) 875-125 MG tablet Take 1 tablet by mouth every 12 (twelve) hours for 7 days. 14 tablet Gareth Morgan     PDMP not reviewed this encounter.   Shirlee Latch, PA-C 05/17/20 1216

## 2020-05-18 LAB — SARS CORONAVIRUS 2 (TAT 6-24 HRS): SARS Coronavirus 2: NEGATIVE

## 2020-06-03 ENCOUNTER — Ambulatory Visit
Admission: EM | Admit: 2020-06-03 | Discharge: 2020-06-03 | Disposition: A | Discharge status: Home / Self Care | Payer: Medicare Other | Attending: Family Medicine | Admitting: Family Medicine

## 2020-06-03 ENCOUNTER — Other Ambulatory Visit: Payer: Self-pay

## 2020-06-03 ENCOUNTER — Encounter: Payer: Self-pay | Admitting: Emergency Medicine

## 2020-06-03 DIAGNOSIS — N3001 Acute cystitis with hematuria: Secondary | ICD-10-CM | POA: Diagnosis not present

## 2020-06-03 LAB — URINALYSIS, COMPLETE (UACMP) WITH MICROSCOPIC
Bilirubin Urine: NEGATIVE
Glucose, UA: NEGATIVE mg/dL
Ketones, ur: NEGATIVE mg/dL
Nitrite: NEGATIVE
Protein, ur: NEGATIVE mg/dL
Specific Gravity, Urine: 1.01 (ref 1.005–1.030)
WBC, UA: >50 WBC/hpf (ref 0–5)
pH: 5.5 (ref 5.0–8.0)

## 2020-06-03 MED ORDER — CEPHALEXIN 500 MG PO CAPS: 500.0000 mg | ORAL_CAPSULE | Freq: Two times a day (BID) | ORAL | 0 refills | Status: DC

## 2020-06-03 NOTE — ED Provider Notes (Signed)
[facility]    CSN: [id number] Arrival date & time: 06/03/20  0903      History   Chief Complaint Chief Complaint  Patient presents with  . Dysuria   HPI   75 year old female presents with dysuria.  Started on Wednesday.  She reports dysuria and nocturia.  She reports low back pain.  Patient has chronic low back pain according to the EMR.  No nausea vomiting.  No significant abdominal pain.  No fever.  No relieving factors.  No other complaints.  Past Medical History:  Diagnosis Date  . Chronic back pain   . GERD (gastroesophageal reflux disease)   . Hypertension    Past Surgical History:  Procedure Laterality Date  . BACK SURGERY    . EYE SURGERY    . MASTECTOMY    . REPLACEMENT TOTAL KNEE BILATERAL    . SHOULDER SURGERY Right     OB History   No obstetric history on file.      Home Medications    Prior to Admission medications   Medication Sig Start Date End Date Taking? Authorizing Provider  cephALEXin (KEFLEX) 500 MG capsule Take 1 capsule (500 mg total) by mouth 2 (two) times daily. 06/03/20  Yes Donelle Hise G, DO  Dexlansoprazole 30 MG capsule Take by mouth.   Yes [provider]  DULoxetine (CYMBALTA) 60 MG capsule TAKE ONE CAPSULE BY MOUTH EVERY DAY 01/18/16  Yes [provider]  OXYGEN Inhale 1 L into the lungs every evening.   Yes [provider]  pregabalin (LYRICA) 100 MG capsule TAKE ONE CAPSULE BY MOUTH 3 TIMES A DAY 11/01/15  Yes [provider]  valsartan (DIOVAN) 80 MG tablet Take 80 mg by mouth daily. 03/18/20  Yes [provider]  aspirin-acetaminophen-caffeine (EXCEDRIN MIGRAINE) [phone] MG tablet Take by mouth.    [provider]  diphenhydrAMINE (BENADRYL) 25 MG tablet Take 25 mg by mouth daily as needed.    [provider]  ipratropium (ATROVENT) 0.06 % nasal spray Place 2 sprays into both nostrils 4 (four) times daily. 05/17/20   Shirlee Latch, PA-C  losartan  (COZAAR) 50 MG tablet TAKE 1 TABLET BY MOUTH EVERY DAY 01/17/16 06/03/20  [provider]    Family History Family History  Problem Relation Age of Onset  . Other Mother        septic from bladder infection  . Heart disease Father   . Diabetes Father     Social History Social History   Tobacco Use  . Smoking status: Never Smoker  . Smokeless tobacco: Never Used  Vaping Use  . Vaping Use: Never used  Substance Use Topics  . Alcohol use: Yes    Comment: rarely  . Drug use: Never     Allergies   Pregabalin and Ciprofloxacin   Review of Systems Review of Systems  Constitutional: Negative for fever.  Genitourinary: Positive for dysuria.       Nocturia.  Musculoskeletal: Positive for back pain.   Physical Exam Triage Vital Signs ED Triage Vitals  Enc Vitals Group     BP 06/03/20 0936 123/69     Pulse Rate 06/03/20 0936 88     Resp 06/03/20 0936 14     Temp 06/03/20 0936 98.6 F (37 C)     Temp Source 06/03/20 0936 Oral     SpO2 06/03/20 0936 100 %     Weight 06/03/20 0933 181 lb (82.1 kg)     Height  06/03/20 0933 4\' 11"  (1.499 m)     Head Circumference --      Peak Flow --      Pain Score 06/03/20 0933 5     Pain Loc --      Pain Edu? --      Excl. in GC? --    Updated Vital Signs BP 123/69 (BP Location: Left Arm)   Pulse 88   Temp 98.6 F (37 C) (Oral)   Resp 14   Ht 4\' 11"  (1.499 m)   Wt 82.1 kg   SpO2 100%   BMI 36.56 kg/m   Visual Acuity Right Eye Distance:   Left Eye Distance:   Bilateral Distance:    Right Eye Near:   Left Eye Near:    Bilateral Near:     Physical Exam Vitals and nursing note reviewed.  Constitutional:      General: She is not in acute distress.    Appearance: Normal appearance. She is not ill-appearing.  HENT:     Head: Normocephalic and atraumatic.  Eyes:     General:        Right eye: No discharge.        Left eye: No discharge.     Conjunctiva/sclera: Conjunctivae normal.  Cardiovascular:     Rate  and Rhythm: Normal rate and regular rhythm.  Pulmonary:     Effort: Pulmonary effort is normal.     Breath sounds: Normal breath sounds.  Abdominal:     General: There is no distension.     Palpations: Abdomen is soft.     Tenderness: There is no abdominal tenderness.  Neurological:     Mental Status: She is alert.  Psychiatric:        Mood and Affect: Mood normal.        Behavior: Behavior normal.    UC Treatments / Results  Labs (all labs ordered are listed, but only abnormal results are displayed) Labs Reviewed  URINALYSIS, COMPLETE (UACMP) WITH MICROSCOPIC - Abnormal; Notable for the following components:      Result Value   APPearance CLOUDY (*)    Hgb urine dipstick MODERATE (*)    Leukocytes,Ua LARGE (*)    Bacteria, UA MANY (*)    All other components within normal limits  URINE CULTURE    EKG   Radiology No results found.  Procedures Procedures (including critical care time)  Medications Ordered in UC Medications - No data to display  Initial Impression / Assessment and Plan / UC Course  I have reviewed the triage vital signs and the nursing notes.  Pertinent labs & imaging results that were available during my care of the patient were reviewed by me and considered in my medical decision making (see chart for details).    75 year old female presents with UTI.  Sending culture.  Placing on Keflex.  Final Clinical Impressions(s) / UC Diagnoses   Final diagnoses:  Acute cystitis with hematuria   Discharge Instructions   None    ED Prescriptions    Medication Sig Dispense Auth. Provider   cephALEXin (KEFLEX) 500 MG capsule Take 1 capsule (500 mg total) by mouth 2 (two) times daily. 14 capsule G, DO     PDMP not reviewed this encounter.   61, Everlene Other 06/03/20 1104

## 2020-06-03 NOTE — ED Triage Notes (Signed)
Patient c/o burning when urinating that started on Wed.  Patient denies fevers.

## 2020-06-05 LAB — URINE CULTURE: Culture: >=100000 — AB

## 2020-08-03 ENCOUNTER — Encounter: Payer: Self-pay | Admitting: Emergency Medicine

## 2020-08-03 ENCOUNTER — Emergency Department: Payer: Medicare Other

## 2020-08-03 ENCOUNTER — Other Ambulatory Visit: Payer: Self-pay

## 2020-08-03 ENCOUNTER — Inpatient Hospital Stay
Admission: EM | Admit: 2020-08-03 | Discharge: 2020-08-06 | DRG: 872 | Disposition: A | Discharge status: Home / Self Care | Payer: Medicare Other | Attending: Hospitalist | Admitting: Hospitalist

## 2020-08-03 DIAGNOSIS — N3000 Acute cystitis without hematuria: Secondary | ICD-10-CM | POA: Diagnosis not present

## 2020-08-03 DIAGNOSIS — Z87440 Personal history of urinary (tract) infections: Secondary | ICD-10-CM | POA: Diagnosis not present

## 2020-08-03 DIAGNOSIS — A4151 Sepsis due to Escherichia coli [E. coli]: Secondary | ICD-10-CM | POA: Diagnosis present

## 2020-08-03 DIAGNOSIS — G8929 Other chronic pain: Secondary | ICD-10-CM | POA: Diagnosis present

## 2020-08-03 DIAGNOSIS — N390 Urinary tract infection, site not specified: Secondary | ICD-10-CM | POA: Diagnosis present

## 2020-08-03 DIAGNOSIS — M549 Dorsalgia, unspecified: Secondary | ICD-10-CM | POA: Diagnosis present

## 2020-08-03 DIAGNOSIS — K219 Gastro-esophageal reflux disease without esophagitis: Secondary | ICD-10-CM | POA: Diagnosis present

## 2020-08-03 DIAGNOSIS — Z96653 Presence of artificial knee joint, bilateral: Secondary | ICD-10-CM | POA: Diagnosis present

## 2020-08-03 DIAGNOSIS — A419 Sepsis, unspecified organism: Secondary | ICD-10-CM

## 2020-08-03 DIAGNOSIS — Z20822 Contact with and (suspected) exposure to covid-19: Secondary | ICD-10-CM | POA: Diagnosis present

## 2020-08-03 DIAGNOSIS — I10 Essential (primary) hypertension: Secondary | ICD-10-CM | POA: Diagnosis present

## 2020-08-03 DIAGNOSIS — Z888 Allergy status to other drugs, medicaments and biological substances status: Secondary | ICD-10-CM

## 2020-08-03 DIAGNOSIS — Z79899 Other long term (current) drug therapy: Secondary | ICD-10-CM | POA: Diagnosis not present

## 2020-08-03 DIAGNOSIS — Z881 Allergy status to other antibiotic agents status: Secondary | ICD-10-CM | POA: Diagnosis not present

## 2020-08-03 DIAGNOSIS — Z8249 Family history of ischemic heart disease and other diseases of the circulatory system: Secondary | ICD-10-CM

## 2020-08-03 DIAGNOSIS — R519 Headache, unspecified: Secondary | ICD-10-CM | POA: Diagnosis present

## 2020-08-03 LAB — CBC WITH DIFFERENTIAL/PLATELET
Abs Immature Granulocytes: 0.04 10*3/uL (ref 0.00–0.07)
Basophils Absolute: 0 10*3/uL (ref 0.0–0.1)
Basophils Relative: 0 %
Eosinophils Absolute: 0 10*3/uL (ref 0.0–0.5)
Eosinophils Relative: 0 %
HCT: 38.5 % (ref 36.0–46.0)
Hemoglobin: 13.2 g/dL (ref 12.0–15.0)
Immature Granulocytes: 0 %
Lymphocytes Relative: 3 %
Lymphs Abs: 0.3 10*3/uL — ABNORMAL LOW (ref 0.7–4.0)
MCH: 31.5 pg (ref 26.0–34.0)
MCHC: 34.3 g/dL (ref 30.0–36.0)
MCV: 91.9 fL (ref 80.0–100.0)
Monocytes Absolute: 0.7 10*3/uL (ref 0.1–1.0)
Monocytes Relative: 8 %
Neutro Abs: 8 10*3/uL — ABNORMAL HIGH (ref 1.7–7.7)
Neutrophils Relative %: 89 %
Platelets: 164 10*3/uL (ref 150–400)
RBC: 4.19 MIL/uL (ref 3.87–5.11)
RDW: 12.2 % (ref 11.5–15.5)
WBC: 9.1 10*3/uL (ref 4.0–10.5)
nRBC: 0 % (ref 0.0–0.2)

## 2020-08-03 LAB — COMPREHENSIVE METABOLIC PANEL
ALT: 16 U/L (ref 0–44)
AST: 27 U/L (ref 15–41)
Albumin: 4 g/dL (ref 3.5–5.0)
Alkaline Phosphatase: 78 U/L (ref 38–126)
Anion gap: 9 (ref 5–15)
BUN: 12 mg/dL (ref 8–23)
CO2: 24 mmol/L (ref 22–32)
Calcium: 9.6 mg/dL (ref 8.9–10.3)
Chloride: 104 mmol/L (ref 98–111)
Creatinine, Ser: 0.79 mg/dL (ref 0.44–1.00)
GFR, Estimated: >60 mL/min (ref >60)
Glucose, Bld: 188 mg/dL — ABNORMAL HIGH (ref 70–99)
Potassium: 3.5 mmol/L (ref 3.5–5.1)
Sodium: 137 mmol/L (ref 135–145)
Total Bilirubin: 0.9 mg/dL (ref 0.3–1.2)
Total Protein: 7.2 g/dL (ref 6.5–8.1)

## 2020-08-03 LAB — URINALYSIS, COMPLETE (UACMP) WITH MICROSCOPIC
Bilirubin Urine: NEGATIVE
Glucose, UA: NEGATIVE mg/dL
Ketones, ur: 20 mg/dL — AB
Nitrite: POSITIVE — AB
Protein, ur: NEGATIVE mg/dL
Specific Gravity, Urine: 1.013 (ref 1.005–1.030)
pH: 6 (ref 5.0–8.0)

## 2020-08-03 LAB — LACTIC ACID, PLASMA
Lactic Acid, Venous: 1 mmol/L (ref 0.5–1.9)
Lactic Acid, Venous: 1.1 mmol/L (ref 0.5–1.9)

## 2020-08-03 LAB — RESP PANEL BY RT-PCR (FLU A&B, COVID) ARPGX2
Influenza A by PCR: NEGATIVE
Influenza B by PCR: NEGATIVE
SARS Coronavirus 2 by RT PCR: NEGATIVE

## 2020-08-03 LAB — PROCALCITONIN: Procalcitonin: 3.52 ng/mL

## 2020-08-03 MED ORDER — IPRATROPIUM BROMIDE 0.06 % NA SOLN: 2.0000 | Freq: Four times a day (QID) | NASAL | Status: DC

## 2020-08-03 MED ORDER — IRBESARTAN 75 MG PO TABS
75.0000 mg | ORAL_TABLET | Freq: Every day | ORAL | Status: DC
  Administered 2020-08-04 – 2020-08-06 (×3): 75 mg via ORAL
  Filled 2020-08-03 (×3): qty 1

## 2020-08-03 MED ORDER — ACETAMINOPHEN 650 MG RE SUPP: 650.0000 mg | Freq: Four times a day (QID) | RECTAL | Status: DC | PRN

## 2020-08-03 MED ORDER — PREGABALIN 50 MG PO CAPS
100.0000 mg | ORAL_CAPSULE | Freq: Three times a day (TID) | ORAL | Status: DC
  Administered 2020-08-03 – 2020-08-06 (×9): 100 mg via ORAL
  Filled 2020-08-03: qty 2
  Filled 2020-08-03: qty 4
  Filled 2020-08-03: qty 2
  Filled 2020-08-03 (×2): qty 4
  Filled 2020-08-03 (×4): qty 2

## 2020-08-03 MED ORDER — SODIUM CHLORIDE 0.9 % IV SOLN: INTRAVENOUS | Status: DC

## 2020-08-03 MED ORDER — ONDANSETRON HCL 4 MG PO TABS: 4.0000 mg | ORAL_TABLET | Freq: Four times a day (QID) | ORAL | Status: DC | PRN

## 2020-08-03 MED ORDER — ASPIRIN-ACETAMINOPHEN-CAFFEINE 250-250-65 MG PO TABS
1.0000 | ORAL_TABLET | Freq: Four times a day (QID) | ORAL | Status: DC | PRN
  Administered 2020-08-04 – 2020-08-05 (×4): 1 via ORAL
  Filled 2020-08-03 (×7): qty 1

## 2020-08-03 MED ORDER — IPRATROPIUM BROMIDE 0.06 % NA SOLN
2.0000 | Freq: Four times a day (QID) | NASAL | Status: DC
  Administered 2020-08-04 – 2020-08-06 (×8): 2 via NASAL
  Filled 2020-08-03: qty 15

## 2020-08-03 MED ORDER — SODIUM CHLORIDE 0.9 % IV SOLN
1.0000 g | INTRAVENOUS | Status: DC
  Administered 2020-08-03 – 2020-08-06 (×4): 1 g via INTRAVENOUS
  Filled 2020-08-03: qty 10
  Filled 2020-08-03 (×2): qty 1
  Filled 2020-08-03: qty 10
  Filled 2020-08-03: qty 1

## 2020-08-03 MED ORDER — ENOXAPARIN SODIUM 40 MG/0.4ML IJ SOSY
40.0000 mg | PREFILLED_SYRINGE | INTRAMUSCULAR | Status: DC
  Administered 2020-08-03 – 2020-08-05 (×3): 40 mg via SUBCUTANEOUS
  Filled 2020-08-03 (×3): qty 0.4

## 2020-08-03 MED ORDER — ACETAMINOPHEN 500 MG PO TABS
1000.0000 mg | ORAL_TABLET | Freq: Once | ORAL | Status: AC
  Administered 2020-08-03: 1000 mg via ORAL
  Filled 2020-08-03: qty 2

## 2020-08-03 MED ORDER — LACTATED RINGERS IV BOLUS
1000.0000 mL | Freq: Once | INTRAVENOUS | Status: AC
  Administered 2020-08-03: 1000 mL via INTRAVENOUS

## 2020-08-03 MED ORDER — PANTOPRAZOLE SODIUM 40 MG PO TBEC
40.0000 mg | DELAYED_RELEASE_TABLET | Freq: Every day | ORAL | Status: DC
  Administered 2020-08-03 – 2020-08-06 (×4): 40 mg via ORAL
  Filled 2020-08-03 (×3): qty 1

## 2020-08-03 MED ORDER — DULOXETINE HCL 30 MG PO CPEP
60.0000 mg | ORAL_CAPSULE | Freq: Every day | ORAL | Status: DC
  Administered 2020-08-03 – 2020-08-06 (×4): 60 mg via ORAL
  Filled 2020-08-03 (×3): qty 2

## 2020-08-03 MED ORDER — ACETAMINOPHEN 325 MG PO TABS
650.0000 mg | ORAL_TABLET | Freq: Four times a day (QID) | ORAL | Status: DC | PRN
  Administered 2020-08-04: 650 mg via ORAL
  Filled 2020-08-03: qty 2

## 2020-08-03 MED ORDER — IRBESARTAN 75 MG PO TABS: 75.0000 mg | ORAL_TABLET | Freq: Every day | ORAL | Status: DC

## 2020-08-03 MED ORDER — ONDANSETRON HCL 4 MG/2ML IJ SOLN: 4.0000 mg | Freq: Four times a day (QID) | INTRAMUSCULAR | Status: DC | PRN

## 2020-08-03 NOTE — Progress Notes (Signed)
Elink following Code Sepsis. 

## 2020-08-03 NOTE — Progress Notes (Signed)
CODE SEPSIS - PHARMACY COMMUNICATION  **Broad Spectrum Antibiotics should be administered within 1 hour of Sepsis diagnosis**  Time Code Sepsis Called/Page Received: 1207  Antibiotics Ordered: Ceftriaxone  Time of 1st antibiotic administration: 1222  Additional action taken by pharmacy: N/A  Tressie Ellis 08/03/2020  12:14 PM

## 2020-08-03 NOTE — ED Notes (Signed)
Informed RN bed assigned 

## 2020-08-03 NOTE — ED Triage Notes (Signed)
Pt reports that last night she developed chills and body aches. She feels warm to the touch. She states that she is around her grandsons and they are always sick and they test them regularly for COVID it has always come back negative.

## 2020-08-03 NOTE — H&P (Signed)
History and Physical    Keren Alverio GLO:[id number] DOB: [birth year] DOA: 08/03/2020  PCP: Pcp, No   Patient coming from: Home  I have personally briefly reviewed patient's old medical records in [facility]  Chief Complaint: Chills  HPI: Marilyn Keith is a 75 y.o. female with medical history significant for GERD, chronic low back pain, hypertension and frequent UTIs who presents to the emergency room for evaluation of chills and body aches.   Patient states that she was in her usual state of health until the night prior to her admission when she developed shaking chills.  She thinks that she may have had a fever but did not check her temperature.  She also had nausea with dry heaves but no vomiting.  She states that about 2 weeks ago she had urinary symptoms which include frequency and dysuria but was taking amoxicillin for a tooth infection and that her symptoms improved so she has assumed that her UTI was resolved. She denies having any flank pain, no cough, no changes in her bowel habits, no dizziness, no lightheadedness, no chest pain, no shortness of breath, no palpitations, no diaphoresis, no headache, no blurred vision no focal deficit. Labs show sodium 137, potassium 3.5, chloride 104, bicarb 24, glucose 188, BUN 12, creatinine 0.79, calcium 9.6, alkaline phosphatase 78, albumin 4.0, AST 27, ALT 16, total protein 7.2, lactic acid 1.0, procalcitonin 3.52, white count 9.1, hemoglobin 13.2, hemoglobin 13.5, MCV 91.9, RDW 12.2, platelet count 164 Respiratory viral panel is negative Chest x-ray reviewed by me shows no evidence of acute cardiopulmonary disease Twelve-lead EKG reviewed by me shows sinus tachycardia with left axis deviation and prolonged QT.  ED Course: Patient is a 75 year old female who presents to the ER for evaluation of chills and was found to be febrile with a T-max of 101.7 F.  She was also tachycardic.  She has pyuria but with a normal white cell count and no  evidence of lactic acidosis. She received 2 L fluid bolus in the ER as well as a dose of IV Rocephin and will be admitted to the hospital for further evaluation.      Review of Systems: As per HPI otherwise all other systems reviewed and negative.    Past Medical History:  Diagnosis Date   Chronic back pain    GERD (gastroesophageal reflux disease)    Hypertension     Past Surgical History:  Procedure Laterality Date   BACK SURGERY     EYE SURGERY     MASTECTOMY     REPLACEMENT TOTAL KNEE BILATERAL     SHOULDER SURGERY Right      reports that she has never smoked. She has never used smokeless tobacco. She reports current alcohol use. She reports that she does not use drugs.  Allergies  Allergen Reactions   Pregabalin Itching    Uses benadryl so she can take daily   Ciprofloxacin Rash and Other (See Comments)    Family History  Problem Relation Age of Onset   Other Mother        septic from bladder infection   Heart disease Father    Diabetes Father       Prior to Admission medications   Medication Sig Start Date End Date Taking? Authorizing Provider  aspirin-acetaminophen-caffeine (EXCEDRIN MIGRAINE) [phone] MG tablet Take by mouth.   Yes [provider]  Dexlansoprazole 30 MG capsule Take by mouth.   Yes [provider]  diphenhydrAMINE (BENADRYL) 25 MG tablet Take  25 mg by mouth daily as needed.   Yes [provider]  DULoxetine (CYMBALTA) 60 MG capsule TAKE ONE CAPSULE BY MOUTH EVERY DAY 01/18/16  Yes [provider]  ipratropium (ATROVENT) 0.06 % nasal spray Place 2 sprays into both nostrils 4 (four) times daily. 05/17/20  Yes Eusebio Friendly B, PA-C  pregabalin (LYRICA) 100 MG capsule TAKE ONE CAPSULE BY MOUTH 3 TIMES A DAY 11/01/15  Yes [provider]  valsartan (DIOVAN) 80 MG tablet Take 80 mg by mouth daily. 03/18/20  Yes [provider]  cephALEXin (KEFLEX) 500 MG capsule Take 1 capsule (500 mg total) by  mouth 2 (two) times daily. Patient not taking: No sig reported 06/03/20   Tommie Sams, DO  OXYGEN Inhale 1 L into the lungs every evening.    [provider]  losartan (COZAAR) 50 MG tablet TAKE 1 TABLET BY MOUTH EVERY DAY 01/17/16 06/03/20  [provider]    Physical Exam: Vitals:   08/03/20 1040 08/03/20 1041 08/03/20 1210 08/03/20 1324  BP: 116/73  131/76 117/64  Pulse: (!) 125  (!) 105 86  Resp: 20  18 15   Temp: 99.8 F (37.7 C)  (!) 101.7 F (38.7 C)   TempSrc: Oral  Rectal   SpO2: 95%  95% 99%  Weight:  81.6 kg    Height:  4\' 11"  (1.499 m)       Vitals:   08/03/20 1040 08/03/20 1041 08/03/20 1210 08/03/20 1324  BP: 116/73  131/76 117/64  Pulse: (!) 125  (!) 105 86  Resp: 20  18 15   Temp: 99.8 F (37.7 C)  (!) 101.7 F (38.7 C)   TempSrc: Oral  Rectal   SpO2: 95%  95% 99%  Weight:  81.6 kg    Height:  4\' 11"  (1.499 m)        Constitutional: Alert and oriented x 3,. Not in any apparent distress HEENT:      Head: Normocephalic and atraumatic.         Eyes: PERLA, EOMI, Conjunctivae are normal. Sclera is non-icteric.       Mouth/Throat: Mucous membranes are moist.       Neck: Supple with no signs of meningismus. Cardiovascular: Tachycardia. No murmurs, gallops, or rubs. 2+ symmetrical distal pulses are present . No JVD. No LE edema Respiratory: Respiratory effort normal .Lungs sounds clear bilaterally. No wheezes, crackles, or rhonchi.  Gastrointestinal: Soft, tender in the suprapubic area, and non distended with positive bowel sounds.  Genitourinary: No CVA tenderness. Musculoskeletal: Nontender with normal range of motion in all extremities. No cyanosis, or erythema of extremities. Neurologic:  Face is symmetric. Moving all extremities. No gross focal neurologic deficits . Skin: Skin is warm, dry.  No rash or ulcers Psychiatric: Mood and affect are normal    Labs on Admission: I have personally reviewed following labs and imaging  studies  CBC: Recent Labs  Lab 08/03/20 1043  WBC 9.1  NEUTROABS 8.0*  HGB 13.2  HCT 38.5  MCV 91.9  PLT 164   Basic Metabolic Panel: Recent Labs  Lab 08/03/20 1043  NA 137  K 3.5  CL 104  CO2 24  GLUCOSE 188*  BUN 12  CREATININE 0.79  CALCIUM 9.6   GFR: Estimated Creatinine Clearance: 56.2 mL/min (by C-G formula based on SCr of 0.79 mg/dL). Liver Function Tests: Recent Labs  Lab 08/03/20 1043  AST 27  ALT 16  ALKPHOS 78  BILITOT 0.9  PROT 7.2  ALBUMIN  4.0   No results for input(s): LIPASE, AMYLASE in the last 168 hours. No results for input(s): AMMONIA in the last 168 hours. Coagulation Profile: No results for input(s): INR, PROTIME in the last 168 hours. Cardiac Enzymes: No results for input(s): CKTOTAL, CKMB, CKMBINDEX, TROPONINI in the last 168 hours. BNP (last 3 results) No results for input(s): PROBNP in the last 8760 hours. HbA1C: No results for input(s): HGBA1C in the last 72 hours. CBG: No results for input(s): GLUCAP in the last 168 hours. Lipid Profile: No results for input(s): CHOL, HDL, LDLCALC, TRIG, CHOLHDL, LDLDIRECT in the last 72 hours. Thyroid Function Tests: No results for input(s): TSH, T4TOTAL, FREET4, T3FREE, THYROIDAB in the last 72 hours. Anemia Panel: No results for input(s): VITAMINB12, FOLATE, FERRITIN, TIBC, IRON, RETICCTPCT in the last 72 hours. Urine analysis:    Component Value Date/Time   COLORURINE YELLOW (A) 08/03/2020 1323   APPEARANCEUR CLOUDY (A) 08/03/2020 1323   LABSPEC 1.013 08/03/2020 1323   PHURINE 6.0 08/03/2020 1323   GLUCOSEU NEGATIVE 08/03/2020 1323   HGBUR SMALL (A) 08/03/2020 1323   BILIRUBINUR NEGATIVE 08/03/2020 1323   KETONESUR 20 (A) 08/03/2020 1323   PROTEINUR NEGATIVE 08/03/2020 1323   NITRITE POSITIVE (A) 08/03/2020 1323   LEUKOCYTESUR MODERATE (A) 08/03/2020 1323    Radiological Exams on Admission: DG Chest 2 View  Result Date: 08/03/2020 CLINICAL DATA:  Fever, chills. EXAM: CHEST - 2  VIEW COMPARISON:  March 17, 2020. FINDINGS: The heart size and mediastinal contours are within normal limits. Both lungs are clear. 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 IMPRESSION: No active cardiopulmonary disease. Electronically Signed   By: Lupita Raider M.D.   On: 08/03/2020 11:33     Assessment/Plan Principal Problem:   UTI (urinary tract infection) Active Problems:   Chronic back pain   Hypertension   GERD (gastroesophageal reflux disease)      Urinary tract infection Patient presents to the ER for evaluation of chills and is found to have a fever with a T-max of 101.7 F She has pyuria and recently completed a course of amoxicillin She has no leukocytosis and lactic acid is within normal limits. Prior urine culture yielded E. coli Will place patient empirically on Rocephin 1 g IV daily until urine culture results become available.     Hypertension Continue Avapro   GERD Continue Protonix  DVT prophylaxis: Lovenox  Code Status: full code  Family Communication: Greater than 50% of time was spent discussing patient's condition and plan of care with her at the bedside.  All questions and concerns have been addressed.  She verbalizes understanding and agrees with the plan. Disposition Plan: Back to previous home environment Consults called: none  Status: At the time of admission, it appears that the appropriate admission status for this patient is inpatient. This is judged to be reasonable and necessary in order to provide the required intensity of service to ensure the patient's safety given the presenting symptoms, physical exam findings, and initial radiographic and laboratory data in the context of their comorbid conditions. Patient requires inpatient status due to high intensity of service, high risk for further deterioration and high frequency of surveillance required.    Lucile Shutters MD [facility]     08/03/2020, 3:24  PM

## 2020-08-03 NOTE — ED Provider Notes (Signed)
[facility] Emergency Department Provider Note  ____________________________________________   Event Date/Time   First MD Initiated Contact with Patient 08/03/20 1148     (approximate)  I have reviewed the triage vital signs and the nursing notes.   HISTORY  Chief Complaint Chills and bodyaches    HPI Marilyn Keith is a 75 y.o. female with history of hypertension, chronic back pain, here with nausea, chills, vomiting, fevers.  The patient states that starting yesterday, she developed acute, severe chills.  She felt like she could not stop shaking.  She had associated fatigue and nausea.  She had vomiting.  She has had persistent subjective fevers, chills, and weakness since then.  She does note she had some urinary symptoms about 2 weeks ago, but was taking amoxicillin for a tooth infection and she believes that this had treated her UTI.  She has a history recurrent UTIs.  Denies any flank pain.  No cough.  No known COVID exposures.  No other complaints.  No recent medication changes other than taking the amoxicillin for dental infection, which she believes is improved.    Past Medical History:  Diagnosis Date   Chronic back pain    GERD (gastroesophageal reflux disease)    Hypertension     There are no problems to display for this patient.   Past Surgical History:  Procedure Laterality Date   BACK SURGERY     EYE SURGERY     MASTECTOMY     REPLACEMENT TOTAL KNEE BILATERAL     SHOULDER SURGERY Right     Prior to Admission medications   Medication Sig Start Date End Date Taking? Authorizing Provider  aspirin-acetaminophen-caffeine (EXCEDRIN MIGRAINE) [phone] MG tablet Take by mouth.    [provider]  cephALEXin (KEFLEX) 500 MG capsule Take 1 capsule (500 mg total) by mouth 2 (two) times daily. 06/03/20   Tommie Sams, DO  Dexlansoprazole 30 MG capsule Take by mouth.    [provider]  diphenhydrAMINE (BENADRYL) 25 MG  tablet Take 25 mg by mouth daily as needed.    [provider]  DULoxetine (CYMBALTA) 60 MG capsule TAKE ONE CAPSULE BY MOUTH EVERY DAY 01/18/16   [provider]  ipratropium (ATROVENT) 0.06 % nasal spray Place 2 sprays into both nostrils 4 (four) times daily. 05/17/20   Eusebio Friendly B, PA-C  OXYGEN Inhale 1 L into the lungs every evening.    [provider]  pregabalin (LYRICA) 100 MG capsule TAKE ONE CAPSULE BY MOUTH 3 TIMES A DAY 11/01/15   [provider]  valsartan (DIOVAN) 80 MG tablet Take 80 mg by mouth daily. 03/18/20   [provider]  losartan (COZAAR) 50 MG tablet TAKE 1 TABLET BY MOUTH EVERY DAY 01/17/16 06/03/20  [provider]    Allergies Pregabalin and Ciprofloxacin  Family History  Problem Relation Age of Onset   Other Mother        septic from bladder infection   Heart disease Father    Diabetes Father     Social History Social History   Tobacco Use   Smoking status: Never   Smokeless tobacco: Never  Vaping Use   Vaping Use: Never used  Substance Use Topics   Alcohol use: Yes    Comment: rarely   Drug use: Never    Review of Systems  Review of Systems  Constitutional:  Positive for chills and fatigue. Negative for fever.  HENT:  Negative for congestion and sore  throat.   Eyes:  Negative for visual disturbance.  Respiratory:  Negative for cough and shortness of breath.   Cardiovascular:  Negative for chest pain.  Gastrointestinal:  Positive for nausea and vomiting. Negative for abdominal pain and diarrhea.  Genitourinary:  Negative for flank pain.  Musculoskeletal:  Negative for back pain and neck pain.  Skin:  Negative for rash and wound.  Allergic/Immunologic: Negative for immunocompromised state.  Neurological:  Positive for weakness. Negative for numbness.  Hematological:  Does not bruise/bleed easily.  All other systems reviewed and are negative.    ____________________________________________  PHYSICAL EXAM:      VITAL SIGNS: ED Triage Vitals  Enc Vitals Group     BP 08/03/20 1040 116/73     Pulse Rate 08/03/20 1040 (!) 125     Resp 08/03/20 1040 20     Temp 08/03/20 1040 99.8 F (37.7 C)     Temp Source 08/03/20 1040 Oral     SpO2 08/03/20 1040 95 %     Weight 08/03/20 1041 180 lb (81.6 kg)     Height 08/03/20 1041 4\' 11"  (1.499 m)     Head Circumference --      Peak Flow --      Pain Score 08/03/20 1041 5     Pain Loc --      Pain Edu? --      Excl. in GC? --      Physical Exam Vitals and nursing note reviewed.  Constitutional:      General: She is not in acute distress.    Appearance: She is well-developed.  HENT:     Head: Normocephalic and atraumatic.     Mouth/Throat:     Mouth: Mucous membranes are dry.  Eyes:     Conjunctiva/sclera: Conjunctivae normal.  Cardiovascular:     Rate and Rhythm: Regular rhythm. Tachycardia present.     Heart sounds: Normal heart sounds. No murmur heard.   No friction rub.  Pulmonary:     Effort: Pulmonary effort is normal. No respiratory distress.     Breath sounds: Normal breath sounds. No wheezing or rales.  Abdominal:     General: There is no distension.     Palpations: Abdomen is soft.     Tenderness: There is no abdominal tenderness.  Musculoskeletal:     Cervical back: Neck supple.  Skin:    General: Skin is warm.     Capillary Refill: Capillary refill takes less than 2 seconds.  Neurological:     Mental Status: She is alert and oriented to person, place, and time.     Motor: No abnormal muscle tone.      ____________________________________________   LABS (all labs ordered are listed, but only abnormal results are displayed)  Labs Reviewed  COMPREHENSIVE METABOLIC PANEL - Abnormal; Notable for the following components:      Result Value   Glucose, Bld 188 (*)    All other components within normal limits  CBC WITH DIFFERENTIAL/PLATELET - Abnormal;  Notable for the following components:   Neutro Abs 8.0 (*)    Lymphs Abs 0.3 (*)    All other components within normal limits  URINALYSIS, COMPLETE (UACMP) WITH MICROSCOPIC - Abnormal; Notable for the following components:   Color, Urine YELLOW (*)    APPearance CLOUDY (*)    Hgb urine dipstick SMALL (*)    Ketones, ur 20 (*)    Nitrite POSITIVE (*)    Leukocytes,Ua MODERATE (*)    Bacteria,  UA MANY (*)    All other components within normal limits  RESP PANEL BY RT-PCR (FLU A&B, COVID) ARPGX2  CULTURE, BLOOD (ROUTINE X 2)  CULTURE, BLOOD (ROUTINE X 2)  URINE CULTURE  LACTIC ACID, PLASMA  PROCALCITONIN  LACTIC ACID, PLASMA    ____________________________________________  EKG: Sinus tachycardia, ventricular rate 126.  PR 158, QRS 68, QTc 576.  Long QT.  No acute ST elevations or depressions.  No acute evidence of acute ischemia or infarct ________________________________________  RADIOLOGY All imaging, including plain films, CT scans, and ultrasounds, independently reviewed by me, and interpretations confirmed via formal radiology reads.  ED MD interpretation:   Chest x-ray: No active disease  Official radiology report(s): DG Chest 2 View  Result Date: 08/03/2020 CLINICAL DATA:  Fever, chills. EXAM: CHEST - 2 VIEW COMPARISON:  March 17, 2020. FINDINGS: The heart size and mediastinal contours are within normal limits. Both lungs are clear. 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 IMPRESSION: No active cardiopulmonary disease. Electronically Signed   By: Lupita Raider M.D.   On: 08/03/2020 11:33    ____________________________________________  PROCEDURES   Procedure(s) performed (including Critical Care):  .Critical Care  Date/Time: 08/03/2020 1:56 PM Performed by: Shaune Pollack, MD Authorized by: Shaune Pollack, MD   Critical care provider statement:    Critical care time (minutes):  35   Critical care time was exclusive of:  Separately  billable procedures and treating other patients and teaching time   Critical care was necessary to treat or prevent imminent or life-threatening deterioration of the following conditions:  Cardiac failure, circulatory failure and sepsis   Critical care was time spent personally by me on the following activities:  Development of treatment plan with patient or surrogate, discussions with consultants, evaluation of patient's response to treatment, examination of patient, obtaining history from patient or surrogate, ordering and performing treatments and interventions, ordering and review of laboratory studies, ordering and review of radiographic studies, pulse oximetry, re-evaluation of patient's condition and review of old charts   I assumed direction of critical care for this patient from another provider in my specialty: no    ____________________________________________  INITIAL IMPRESSION / MDM / ASSESSMENT AND PLAN / ED COURSE  As part of my medical decision making, I reviewed the following data within the electronic MEDICAL RECORD NUMBER Nursing notes reviewed and incorporated, Old chart reviewed, Notes from prior ED visits, and [place] Controlled Substance Database       *Dalonda Stith was evaluated in Emergency Department on 08/03/2020 for the symptoms described in the history of present illness. She was evaluated in the context of the global COVID-19 pandemic, which necessitated consideration that the patient might be at risk for infection with the SARS-CoV-2 virus that causes COVID-19. Institutional protocols and algorithms that pertain to the evaluation of patients at risk for COVID-19 are in a state of rapid change based on information released by regulatory bodies including the CDC and federal and state organizations. These policies and algorithms were followed during the patient's care in the ED.  Some ED evaluations and interventions may be delayed as a result of limited staffing during the  pandemic.*     Medical Decision Making: 75 year old female here with nausea, chills, and fatigue.  On arrival, patient febrile, tachycardic, with suspected sepsis.  Sepsis protocol initiated and patient given empiric Rocephin for what I suspect is a UTI.  She reports she had urinary symptoms prior to starting amoxicillin for a dental infection,  and I suspect she has had an ongoing UTI, possibly partially treated.  She has a history of recurrent UTIs.  I reviewed her previous cultures from May which showed pansensitive E. coli.  Will start on IV Rocephin.  Otherwise, lab work is overall fairly reassuring.  CBC without significant leukocytosis.  CMP unremarkable.  Lactic acid is normal.  COVID is negative.  UA shows significant pyuria and positive nitrites.  Chest x-ray reviewed and is unremarkable.  No flank pain to suggest infected stone.  Will admit for IV antibiotics and management of sepsis due to pyelonephritis.  ____________________________________________  FINAL CLINICAL IMPRESSION(S) / ED DIAGNOSES  Final diagnoses:  Sepsis secondary to UTI [facility])     MEDICATIONS GIVEN DURING THIS VISIT:  Medications  cefTRIAXone (ROCEPHIN) 1 g in sodium chloride 0.9 % 100 mL IVPB (0 g Intravenous Stopped 08/03/20 1324)  lactated ringers bolus 1,000 mL (has no administration in time range)  lactated ringers bolus 1,000 mL (0 mLs Intravenous Stopped 08/03/20 1323)  acetaminophen (TYLENOL) tablet 1,000 mg (1,000 mg Oral Given 08/03/20 1328)     ED Discharge Orders     None        Note:  This document was prepared using Dragon voice recognition software and may include unintentional dictation errors.   Shaune Pollack, MD 08/03/20 1357

## 2020-08-03 NOTE — ED Notes (Signed)
Request made for transport  

## 2020-08-04 DIAGNOSIS — N3000 Acute cystitis without hematuria: Secondary | ICD-10-CM | POA: Diagnosis not present

## 2020-08-04 NOTE — Progress Notes (Signed)
PROGRESS NOTE    Marilyn Keith  NAT:[id number] DOB: [birth year] DOA: 08/03/2020 PCP: Pcp, No  226A/226A-AA   Assessment & Plan:   Principal Problem:   UTI (urinary tract infection) Active Problems:   Chronic back pain   Hypertension   GERD (gastroesophageal reflux disease)   Marilyn Keith is a 75 y.o. female with medical history significant for GERD, chronic low back pain, hypertension and frequent UTIs who presents to the emergency room for evaluation of chills and body aches.   Patient states that she was in her usual state of health until the night prior to her admission when she developed shaking chills.  She thinks that she may have had a fever but did not check her temperature.  She also had nausea with dry heaves but no vomiting.  She states that about 2 weeks ago she had urinary symptoms which include frequency and dysuria but was taking amoxicillin for a tooth infection and that her symptoms improved so she has assumed that her UTI was resolved.   Sepsis 2/2  Urinary tract infection --fever with a T-max of 101.7 F and tachycardia. --She has pyuria and recently completed a course of amoxicillin --started on empiric ceftriaxone Plan: --cont ceftriaxone pending urine cx results   Hypertension --cont Avapro (on home valsartan)  GERD --cont PPI   Chronic back pain --cont Lyrica   DVT prophylaxis: Lovenox SQ Code Status: Full code  Family Communication:  Level of care: Med-Surg Dispo:   The patient is from: home Anticipated d/c is to: home Anticipated d/c date is: 1-2 days Patient currently is not medically ready to d/c due to: UTI on IV abx pending cx results   Subjective and Interval History:  Pt reported dysuria improved.  Normal oral intake.  Pt said that she uses pads for urinary incontinence, and has occasional minor stool leakage as well.     Objective: Vitals:   08/04/20 2123 08/04/20 2335 08/05/20 0501 08/05/20 1613  BP: (!) 141/79  116/64  122/72  Pulse: 88  67 85  Resp: 18  18 16   Temp: 97.7 F (36.5 C)  98.3 F (36.8 C) (!) 97.4 F (36.3 C)  TempSrc: Oral  Oral Oral  SpO2: 97% 98% 99% 100%  Weight:      Height:        Intake/Output Summary (Last 24 hours) at 08/05/2020 1851 Last data filed at 08/05/2020 1402 Gross per 24 hour  Intake 603.18 ml  Output 3100 ml  Net -2496.82 ml   Filed Weights   08/03/20 1041  Weight: 81.6 kg    Examination:   Constitutional: NAD, AAOx3 HEENT: conjunctivae and lids normal, EOMI CV: No cyanosis.   RESP: normal respiratory effort, on RA Extremities: No effusions, edema in BLE SKIN: warm, dry Neuro: II - XII grossly intact.   Psych: Normal mood and affect.  Appropriate judgement and reason   Data Reviewed: I have personally reviewed following labs and imaging studies  CBC: Recent Labs  Lab 08/03/20 1043 08/05/20 0516  WBC 9.1 3.8*  NEUTROABS 8.0*  --   HGB 13.2 11.2*  HCT 38.5 33.5*  MCV 91.9 92.0  PLT 164 156   Basic Metabolic Panel: Recent Labs  Lab 08/03/20 1043 08/05/20 0516  NA 137 138  K 3.5 3.4*  CL 104 105  CO2 24 29  GLUCOSE 188* 122*  BUN 12 9  CREATININE 0.79 0.65  CALCIUM 9.6 9.3  MG  --  1.9   GFR: Estimated Creatinine Clearance:  56.2 mL/min (by C-G formula based on SCr of 0.65 mg/dL). Liver Function Tests: Recent Labs  Lab 08/03/20 1043  AST 27  ALT 16  ALKPHOS 78  BILITOT 0.9  PROT 7.2  ALBUMIN 4.0   No results for input(s): LIPASE, AMYLASE in the last 168 hours. No results for input(s): AMMONIA in the last 168 hours. Coagulation Profile: No results for input(s): INR, PROTIME in the last 168 hours. Cardiac Enzymes: No results for input(s): CKTOTAL, CKMB, CKMBINDEX, TROPONINI in the last 168 hours. BNP (last 3 results) No results for input(s): PROBNP in the last 8760 hours. HbA1C: No results for input(s): HGBA1C in the last 72 hours. CBG: No results for input(s): GLUCAP in the last 168 hours. Lipid Profile: No results  for input(s): CHOL, HDL, LDLCALC, TRIG, CHOLHDL, LDLDIRECT in the last 72 hours. Thyroid Function Tests: No results for input(s): TSH, T4TOTAL, FREET4, T3FREE, THYROIDAB in the last 72 hours. Anemia Panel: No results for input(s): VITAMINB12, FOLATE, FERRITIN, TIBC, IRON, RETICCTPCT in the last 72 hours. Sepsis Labs: Recent Labs  Lab 08/03/20 1043 08/03/20 1158 08/03/20 2002  PROCALCITON  --  3.52  --   LATICACIDVEN 1.0  --  1.1    Recent Results (from the past 240 hour(s))  Blood culture (routine x 2)     Status: None (Preliminary result)   Collection Time: 08/03/20 10:55 AM   Specimen: BLOOD LEFT HAND  Result Value Ref Range Status   Specimen Description BLOOD LEFT HAND  Final   Special Requests   Final    BOTTLES DRAWN AEROBIC AND ANAEROBIC Blood Culture adequate volume   Culture   Final    NO GROWTH < 24 HOURS Performed at [facility], [address] [place], [place] [address]    Report Status PENDING  Incomplete  Blood culture (routine x 2)     Status: None (Preliminary result)   Collection Time: 08/03/20 11:00 AM   Specimen: Left Antecubital; Blood  Result Value Ref Range Status   Specimen Description LEFT ANTECUBITAL  Final   Special Requests   Final    BOTTLES DRAWN AEROBIC AND ANAEROBIC Blood Culture adequate volume   Culture   Final    NO GROWTH < 24 HOURS Performed at [facility], [address] [place], [place] [address]    Report Status PENDING  Incomplete  Resp Panel by RT-PCR (Flu A&B, Covid) Nasopharyngeal Swab     Status: None   Collection Time: 08/03/20 11:58 AM   Specimen: Nasopharyngeal Swab; Nasopharyngeal(NP) swabs in vial transport medium  Result Value Ref Range Status   SARS Coronavirus 2 by RT PCR NEGATIVE NEGATIVE Final    Comment: (NOTE) SARS-CoV-2 target nucleic acids are NOT DETECTED.  The SARS-CoV-2 RNA is generally detectable in upper respiratory specimens during the acute phase of infection. The  lowest concentration of SARS-CoV-2 viral copies this assay can detect is 138 copies/mL. A negative result does not preclude SARS-Cov-2 infection and should not be used as the sole basis for treatment or other patient management decisions. A negative result may occur with  improper specimen collection/handling, submission of specimen other than nasopharyngeal swab, presence of viral mutation(s) within the areas targeted by this assay, and inadequate number of viral copies(<138 copies/mL). A negative result must be combined with clinical observations, patient history, and epidemiological information. The expected result is Negative.  Fact Sheet for Patients:  BloggerCourse.com  Fact Sheet for Healthcare Providers:  SeriousBroker.it  This test is no t yet approved  or cleared by the [place] and  has been authorized for detection and/or diagnosis of SARS-CoV-2 by FDA under an Emergency Use Authorization (EUA). This EUA will remain  in effect (meaning this test can be used) for the duration of the COVID-19 declaration under Section 564(b)(1) of the Act, 21 U.S.C.section 360bbb-3(b)(1), unless the authorization is terminated  or revoked sooner.       Influenza A by PCR NEGATIVE NEGATIVE Final   Influenza B by PCR NEGATIVE NEGATIVE Final    Comment: (NOTE) The Xpert Xpress SARS-CoV-2/FLU/RSV plus assay is intended as an aid in the diagnosis of influenza from Nasopharyngeal swab specimens and should not be used as a sole basis for treatment. Nasal washings and aspirates are unacceptable for Xpert Xpress SARS-CoV-2/FLU/RSV testing.  Fact Sheet for Patients: BloggerCourse.com  Fact Sheet for Healthcare Providers: SeriousBroker.it  This test is not yet approved or cleared by the [place] FDA and has been authorized for detection and/or diagnosis of SARS-CoV-2 by FDA under  an Emergency Use Authorization (EUA). This EUA will remain in effect (meaning this test can be used) for the duration of the COVID-19 declaration under Section 564(b)(1) of the Act, 21 U.S.C. section 360bbb-3(b)(1), unless the authorization is terminated or revoked.  Performed at [facility], [address], [place], [place] [address]   Urine Culture     Status: Abnormal (Preliminary result)   Collection Time: 08/03/20  1:52 PM   Specimen: Urine, Clean Catch  Result Value Ref Range Status   Specimen Description   Final    URINE, CLEAN CATCH Performed at [facility], [address], [place], [place] [address]    Special Requests   Final    NONE Performed at [facility], [address], [place], [place] [address]    Culture (A)  Final    40,000 COLONIES/mL ESCHERICHIA COLI SUSCEPTIBILITIES TO FOLLOW Performed at [facility], [address], [place], [place] [address]    Report Status PENDING  Incomplete      Radiology Studies: No results found.   Scheduled Meds:  DULoxetine  60 mg Oral Daily   enoxaparin (LOVENOX) injection  40 mg Subcutaneous Q24H   ipratropium  2 spray Each Nare QID   irbesartan  75 mg Oral Daily   pantoprazole  40 mg Oral QAC breakfast   pregabalin  100 mg Oral TID   Continuous Infusions:  cefTRIAXone (ROCEPHIN)  IV 1 g (08/05/20 1259)     LOS: 2 days     Darlin Priestly, MD [facility] If 7PM-7AM, please contact night-coverage 08/05/2020, 6:51 PM

## 2020-08-05 DIAGNOSIS — N3000 Acute cystitis without hematuria: Secondary | ICD-10-CM | POA: Diagnosis not present

## 2020-08-05 LAB — GLUCOSE, CAPILLARY: Glucose-Capillary: 119 mg/dL — ABNORMAL HIGH (ref 70–99)

## 2020-08-05 LAB — CBC
HCT: 33.5 % — ABNORMAL LOW (ref 36.0–46.0)
Hemoglobin: 11.2 g/dL — ABNORMAL LOW (ref 12.0–15.0)
MCH: 30.8 pg (ref 26.0–34.0)
MCHC: 33.4 g/dL (ref 30.0–36.0)
MCV: 92 fL (ref 80.0–100.0)
Platelets: 156 10*3/uL (ref 150–400)
RBC: 3.64 MIL/uL — ABNORMAL LOW (ref 3.87–5.11)
RDW: 12 % (ref 11.5–15.5)
WBC: 3.8 10*3/uL — ABNORMAL LOW (ref 4.0–10.5)
nRBC: 0 % (ref 0.0–0.2)

## 2020-08-05 LAB — BASIC METABOLIC PANEL
Anion gap: 4 — ABNORMAL LOW (ref 5–15)
BUN: 9 mg/dL (ref 8–23)
CO2: 29 mmol/L (ref 22–32)
Calcium: 9.3 mg/dL (ref 8.9–10.3)
Chloride: 105 mmol/L (ref 98–111)
Creatinine, Ser: 0.65 mg/dL (ref 0.44–1.00)
GFR, Estimated: >60 mL/min (ref >60)
Glucose, Bld: 122 mg/dL — ABNORMAL HIGH (ref 70–99)
Potassium: 3.4 mmol/L — ABNORMAL LOW (ref 3.5–5.1)
Sodium: 138 mmol/L (ref 135–145)

## 2020-08-05 LAB — MAGNESIUM: Magnesium: 1.9 mg/dL (ref 1.7–2.4)

## 2020-08-05 MED ORDER — ASPIRIN-ACETAMINOPHEN-CAFFEINE 250-250-65 MG PO TABS
2.0000 | ORAL_TABLET | Freq: Two times a day (BID) | ORAL | Status: DC | PRN
  Administered 2020-08-05: 2 via ORAL
  Filled 2020-08-05 (×2): qty 2

## 2020-08-05 NOTE — Progress Notes (Signed)
PROGRESS NOTE    Marilyn Keith  QIO:[id number] DOB: 1945/02/21 DOA: 08/03/2020 PCP: Pcp, No  226A/226A-AA   Assessment & Plan:   Principal Problem:   UTI (urinary tract infection) Active Problems:   Chronic back pain   Hypertension   GERD (gastroesophageal reflux disease)   Marilyn Keith is a 75 y.o. female with medical history significant for GERD, chronic low back pain, hypertension and frequent UTIs who presents to the emergency room for evaluation of chills and body aches.   Patient states that she was in her usual state of health until the night prior to her admission when she developed shaking chills.  She thinks that she may have had a fever but did not check her temperature.  She also had nausea with dry heaves but no vomiting.  She states that about 2 weeks ago she had urinary symptoms which include frequency and dysuria but was taking amoxicillin for a tooth infection and that her symptoms improved so she has assumed that her UTI was resolved.   Sepsis 2/2  E coli Urinary tract infection --fever with a T-max of 101.7 F and tachycardia. --She has pyuria and recently completed a course of amoxicillin --started on empiric ceftriaxone Plan: --cont ceftriaxone pending urine cx sensitivities    Hypertension --on home valsartan --cont as Avapro  GERD --cont PPI   Chronic back pain --cont Lyrica  Chronic headache --pt takes Excedrin almost every other day for headache --cont home Excedrin --recommend seeing outpatient headache specialist     DVT prophylaxis: Lovenox SQ Code Status: Full code  Family Communication:  Level of care: Med-Surg Dispo:   The patient is from: home Anticipated d/c is to: home Anticipated d/c date is: likely tomorrow Patient currently is not medically ready to d/c due to: UTI on IV abx pending cx results   Subjective and Interval History:  Pt reported feeling bloated in her abdomen.     Objective: Vitals:   08/05/20 1930  08/06/20 0511 08/06/20 1059 08/06/20 1514  BP: (!) 147/80 130/70 125/77 (!) 143/68  Pulse: 78 79 83 79  Resp: 18 16 16 16   Temp: 97.7 F (36.5 C)  97.8 F (36.6 C) 97.7 F (36.5 C)  TempSrc: Oral  Oral Oral  SpO2: 98% 98% 95% 99%  Weight:      Height:        Intake/Output Summary (Last 24 hours) at 08/07/2020 0035 Last data filed at 08/06/2020 1355 Gross per 24 hour  Intake 420 ml  Output 700 ml  Net -280 ml   Filed Weights   08/03/20 1041  Weight: 81.6 kg    Examination:   Constitutional: NAD, AAOx3 HEENT: conjunctivae and lids normal, EOMI CV: No cyanosis.   RESP: normal respiratory effort, on RA GI: abdomen soft, NTND Extremities: No effusions, edema in BLE SKIN: warm, dry Neuro: II - XII grossly intact.   Psych: Normal mood and affect.  Appropriate judgement and reason   Data Reviewed: I have personally reviewed following labs and imaging studies  CBC: Recent Labs  Lab 08/03/20 1043 08/05/20 0516 08/06/20 0443  WBC 9.1 3.8* 2.8*  NEUTROABS 8.0*  --   --   HGB 13.2 11.2* 10.9*  HCT 38.5 33.5* 32.1*  MCV 91.9 92.0 90.9  PLT 164 156 177   Basic Metabolic Panel: Recent Labs  Lab 08/03/20 1043 08/05/20 0516 08/06/20 0443  NA 137 138 139  K 3.5 3.4* 3.5  CL 104 105 106  CO2 24 29 26  GLUCOSE 188* 122* 93  BUN 12 9 15   CREATININE 0.79 0.65 0.65  CALCIUM 9.6 9.3 9.2  MG  --  1.9 2.0   GFR: Estimated Creatinine Clearance: 56.2 mL/min (by C-G formula based on SCr of 0.65 mg/dL). Liver Function Tests: Recent Labs  Lab 08/03/20 1043  AST 27  ALT 16  ALKPHOS 78  BILITOT 0.9  PROT 7.2  ALBUMIN 4.0   No results for input(s): LIPASE, AMYLASE in the last 168 hours. No results for input(s): AMMONIA in the last 168 hours. Coagulation Profile: No results for input(s): INR, PROTIME in the last 168 hours. Cardiac Enzymes: No results for input(s): CKTOTAL, CKMB, CKMBINDEX, TROPONINI in the last 168 hours. BNP (last 3 results) No results for  input(s): PROBNP in the last 8760 hours. HbA1C: No results for input(s): HGBA1C in the last 72 hours. CBG: Recent Labs  Lab 08/05/20 1946  GLUCAP 119*   Lipid Profile: No results for input(s): CHOL, HDL, LDLCALC, TRIG, CHOLHDL, LDLDIRECT in the last 72 hours. Thyroid Function Tests: No results for input(s): TSH, T4TOTAL, FREET4, T3FREE, THYROIDAB in the last 72 hours. Anemia Panel: No results for input(s): VITAMINB12, FOLATE, FERRITIN, TIBC, IRON, RETICCTPCT in the last 72 hours. Sepsis Labs: Recent Labs  Lab 08/03/20 1043 08/03/20 1158 08/03/20 2002  PROCALCITON  --  3.52  --   LATICACIDVEN 1.0  --  1.1    Recent Results (from the past 240 hour(s))  Blood culture (routine x 2)     Status: None (Preliminary result)   Collection Time: 08/03/20 10:55 AM   Specimen: BLOOD LEFT HAND  Result Value Ref Range Status   Specimen Description BLOOD LEFT HAND  Final   Special Requests   Final    BOTTLES DRAWN AEROBIC AND ANAEROBIC Blood Culture adequate volume   Culture   Final    NO GROWTH 3 DAYS Performed at [facility], [address] [place], [place]    Report Status PENDING  Incomplete  Blood culture (routine x 2)     Status: None (Preliminary result)   Collection Time: 08/03/20 11:00 AM   Specimen: Left Antecubital; Blood  Result Value Ref Range Status   Specimen Description LEFT ANTECUBITAL  Final   Special Requests   Final    BOTTLES DRAWN AEROBIC AND ANAEROBIC Blood Culture adequate volume   Culture   Final    NO GROWTH 3 DAYS Performed at [facility], [address], [place], [place]    Report Status PENDING  Incomplete  Resp Panel by RT-PCR (Flu A&B, Covid) Nasopharyngeal Swab     Status: None   Collection Time: 08/03/20 11:58 AM   Specimen: Nasopharyngeal Swab; Nasopharyngeal(NP) swabs in vial transport medium  Result Value Ref Range Status   SARS Coronavirus 2 by RT PCR NEGATIVE NEGATIVE Final    Comment:  (NOTE) SARS-CoV-2 target nucleic acids are NOT DETECTED.  The SARS-CoV-2 RNA is generally detectable in upper respiratory specimens during the acute phase of infection. The lowest concentration of SARS-CoV-2 viral copies this assay can detect is 138 copies/mL. A negative result does not preclude SARS-Cov-2 infection and should not be used as the sole basis for treatment or other patient management decisions. A negative result may occur with  improper specimen collection/handling, submission of specimen other than nasopharyngeal swab, presence of viral mutation(s) within the areas targeted by this assay, and inadequate number of viral copies(<138 copies/mL). A negative result must be combined with clinical observations, patient history,  and epidemiological information. The expected result is Negative.  Fact Sheet for Patients:  BloggerCourse.com  Fact Sheet for Healthcare Providers:  SeriousBroker.it  This test is no t yet approved or cleared by the [place] FDA and  has been authorized for detection and/or diagnosis of SARS-CoV-2 by FDA under an Emergency Use Authorization (EUA). This EUA will remain  in effect (meaning this test can be used) for the duration of the COVID-19 declaration under Section 564(b)(1) of the Act, 21 U.S.C.section 360bbb-3(b)(1), unless the authorization is terminated  or revoked sooner.       Influenza A by PCR NEGATIVE NEGATIVE Final   Influenza B by PCR NEGATIVE NEGATIVE Final    Comment: (NOTE) The Xpert Xpress SARS-CoV-2/FLU/RSV plus assay is intended as an aid in the diagnosis of influenza from Nasopharyngeal swab specimens and should not be used as a sole basis for treatment. Nasal washings and aspirates are unacceptable for Xpert Xpress SARS-CoV-2/FLU/RSV testing.  Fact Sheet for Patients: BloggerCourse.com  Fact Sheet for Healthcare  Providers: SeriousBroker.it  This test is not yet approved or cleared by the [place] FDA and has been authorized for detection and/or diagnosis of SARS-CoV-2 by FDA under an Emergency Use Authorization (EUA). This EUA will remain in effect (meaning this test can be used) for the duration of the COVID-19 declaration under Section 564(b)(1) of the Act, 21 U.S.C. section 360bbb-3(b)(1), unless the authorization is terminated or revoked.  Performed at [facility], [address] [place], [place] [address]   Urine Culture     Status: Abnormal   Collection Time: 08/03/20  1:52 PM   Specimen: Urine, Clean Catch  Result Value Ref Range Status   Specimen Description   Final    URINE, CLEAN CATCH Performed at [facility], [address] [place], [place] [address]    Special Requests   Final    NONE Performed at [facility], [address] [place], [place] [address]    Culture 40,000 COLONIES/mL ESCHERICHIA COLI (A)  Final   Report Status 08/06/2020 FINAL  Final   Organism ID, Bacteria ESCHERICHIA COLI (A)  Final      Susceptibility   Escherichia coli - MIC*    AMPICILLIN 4 SENSITIVE Sensitive     CEFAZOLIN <=4 SENSITIVE Sensitive     CEFEPIME <=0.12 SENSITIVE Sensitive     CEFTRIAXONE <=0.25 SENSITIVE Sensitive     CIPROFLOXACIN <=0.25 SENSITIVE Sensitive     GENTAMICIN <=1 SENSITIVE Sensitive     IMIPENEM <=0.25 SENSITIVE Sensitive     NITROFURANTOIN <=16 SENSITIVE Sensitive     TRIMETH/SULFA <=20 SENSITIVE Sensitive     AMPICILLIN/SULBACTAM <=2 SENSITIVE Sensitive     PIP/TAZO <=4 SENSITIVE Sensitive     * 40,000 COLONIES/mL ESCHERICHIA COLI      Radiology Studies: No results found.   Scheduled Meds: REM  Continuous Infusions: REM    LOS: 3 days     Darlin Priestly, MD [facility] If 7PM-7AM, please contact night-coverage 08/07/2020, 12:35 AM

## 2020-08-06 LAB — URINE CULTURE: Culture: 40000 — AB

## 2020-08-06 LAB — BASIC METABOLIC PANEL
Anion gap: 7 (ref 5–15)
BUN: 15 mg/dL (ref 8–23)
CO2: 26 mmol/L (ref 22–32)
Calcium: 9.2 mg/dL (ref 8.9–10.3)
Chloride: 106 mmol/L (ref 98–111)
Creatinine, Ser: 0.65 mg/dL (ref 0.44–1.00)
GFR, Estimated: >60 mL/min (ref >60)
Glucose, Bld: 93 mg/dL (ref 70–99)
Potassium: 3.5 mmol/L (ref 3.5–5.1)
Sodium: 139 mmol/L (ref 135–145)

## 2020-08-06 LAB — MAGNESIUM: Magnesium: 2 mg/dL (ref 1.7–2.4)

## 2020-08-06 LAB — CBC
HCT: 32.1 % — ABNORMAL LOW (ref 36.0–46.0)
Hemoglobin: 10.9 g/dL — ABNORMAL LOW (ref 12.0–15.0)
MCH: 30.9 pg (ref 26.0–34.0)
MCHC: 34 g/dL (ref 30.0–36.0)
MCV: 90.9 fL (ref 80.0–100.0)
Platelets: 177 10*3/uL (ref 150–400)
RBC: 3.53 MIL/uL — ABNORMAL LOW (ref 3.87–5.11)
RDW: 12.1 % (ref 11.5–15.5)
WBC: 2.8 10*3/uL — ABNORMAL LOW (ref 4.0–10.5)
nRBC: 0 % (ref 0.0–0.2)

## 2020-08-06 MED ORDER — BUTALBITAL-APAP-CAFFEINE 50-325-40 MG PO TABS: 2.0000 | ORAL_TABLET | Freq: Every day | ORAL | 0 refills | Status: AC | PRN

## 2020-08-06 MED ORDER — CEPHALEXIN 500 MG PO CAPS: 500.0000 mg | ORAL_CAPSULE | Freq: Four times a day (QID) | ORAL | 0 refills | Status: DC

## 2020-08-06 MED ORDER — CEPHALEXIN 500 MG PO CAPS: 500.0000 mg | ORAL_CAPSULE | Freq: Three times a day (TID) | ORAL | 0 refills | Status: AC

## 2020-08-06 MED ORDER — BUTALBITAL-APAP-CAFFEINE 50-325-40 MG PO TABS: 2.0000 | ORAL_TABLET | Freq: Two times a day (BID) | ORAL | Status: DC | PRN

## 2020-08-06 NOTE — Care Management Important Message (Signed)
Important Message  Patient Details  Name: Marilyn Keith MRN: [id number] Date of Birth: 04-22-1945   Medicare Important Message Given:  Yes     [name] 08/06/2020, 2:35 PM

## 2020-08-06 NOTE — Discharge Summary (Signed)
Physician Discharge Summary   Marilyn Keith  female DOB: [birth year]  GQQ:[id number]  PCP: Pcp, No  Admit date: 08/03/2020 Discharge date: 08/06/2020  Admitted From: home Disposition:  home CODE STATUS: Full code  Discharge Instructions     Discharge instructions   Complete by: As directed    You have finished 4 days of IV antibiotic for your urinary track infection.  Please finish 1 more day of Keflex as directed at home starting tomorrow 7/26.  Consider having your PCP refer you to see headache specialist, since you are taking Excedrin so often.  Avoid stool contamination on your incontinence pads to avoid future UTI's.   Dr. Darlin Priestly [facility] Course:  For full details, please see H&P, progress notes, consult notes and ancillary notes.  Briefly,  Marilyn Keith is a 75 y.o. female with medical history significant for GERD, chronic low back pain, hypertension and frequent UTIs who presented to the emergency room for evaluation of chills and body aches.    She states that about 2 weeks ago she had urinary symptoms which include frequency and dysuria but was taking amoxicillin for a tooth infection and that her symptoms improved so she has assumed that her UTI was resolved.   Sepsis 2/2  E coli Urinary tract infection --fever with a T-max of 101.7 F and tachycardia. --She has pyuria and recently completed a course of amoxicillin --started on empiric ceftriaxone --pt finished 4 days of ceftriaxone and was discharged with 1 more day of Keflex.   Hypertension --on home valsartan --cont as Avapro while inpatient.   GERD --cont PPI   Chronic back pain --cont Lyrica   Chronic headache --pt takes Excedrin almost every other day for headache. --cont home Excedrin while inpatient, and also prescribed Fioricet. --recommend seeing outpatient headache specialist    Discharge Diagnoses:  Principal Problem:   UTI (urinary tract infection) Active  Problems:   Chronic back pain   Hypertension   GERD (gastroesophageal reflux disease)   30 Day Unplanned Readmission Risk Score    Flowsheet Row ED to Hosp-Admission (Current) from 08/03/2020 in [facility]  30 Day Unplanned Readmission Risk Score (%) 11.98 Filed at 08/06/2020 0801       This score is the patient's risk of an unplanned readmission within 30 days of being discharged (0 -100%). The score is based on dignosis, age, lab data, medications, orders, and past utilization.   Low:  0-14.9   Medium: 15-21.9   High: 22-29.9   Extreme: 30 and above         Discharge Instructions:  Allergies as of 08/06/2020       Reactions   Pregabalin Itching   Uses benadryl so she can take daily   Ciprofloxacin Rash, Other (See Comments)        Medication List     TAKE these medications    aspirin-acetaminophen-caffeine 250-250-65 MG tablet Commonly known as: EXCEDRIN MIGRAINE Take by mouth.   butalbital-acetaminophen-caffeine 50-325-40 MG tablet Commonly known as: FIORICET Take 2 tablets by mouth daily as needed for headache.   cephALEXin 500 MG capsule Commonly known as: KEFLEX Take 1 capsule (500 mg total) by mouth 4 (four) times daily for 10 days. What changed: You were already taking a medication with the same name, and this prescription was added. Make sure you understand how and when to take each.   cephALEXin 500 MG capsule Commonly known as: KEFLEX  Take 1 capsule (500 mg total) by mouth 3 (three) times daily for 1 day. Finish treatment for UTI. Start taking on: August 07, 2020 What changed:  when to take this additional instructions   Dexlansoprazole 30 MG capsule Take by mouth.   diphenhydrAMINE 25 MG tablet Commonly known as: BENADRYL Take 25 mg by mouth daily as needed.   DULoxetine 60 MG capsule Commonly known as: CYMBALTA TAKE ONE CAPSULE BY MOUTH EVERY DAY   ipratropium 0.06 % nasal spray Commonly known as:  ATROVENT Place 2 sprays into both nostrils 4 (four) times daily.   OXYGEN Inhale 1 L into the lungs every evening.   pregabalin 100 MG capsule Commonly known as: LYRICA TAKE ONE CAPSULE BY MOUTH 3 TIMES A DAY   valsartan 80 MG tablet Commonly known as: DIOVAN Take 80 mg by mouth daily.         Follow-up Information     Your outpatient PCP Follow up in 1 week(s).                  Allergies  Allergen Reactions   Pregabalin Itching    Uses benadryl so she can take daily   Ciprofloxacin Rash and Other (See Comments)     The results of significant diagnostics from this hospitalization (including imaging, microbiology, ancillary and laboratory) are listed below for reference.   Consultations:   Procedures/Studies: DG Chest 2 View  Result Date: 08/03/2020 CLINICAL DATA:  Fever, chills. EXAM: CHEST - 2 VIEW COMPARISON:  March 17, 2020. FINDINGS: The heart size and mediastinal contours are within normal limits. Both lungs are clear. 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 IMPRESSION: No active cardiopulmonary disease. Electronically Signed   By: Lupita Raider M.D.   On: 08/03/2020 11:33      Labs: BNP (last 3 results) No results for input(s): BNP in the last 8760 hours. Basic Metabolic Panel: Recent Labs  Lab 08/03/20 1043 08/05/20 0516 08/06/20 0443  NA 137 138 139  K 3.5 3.4* 3.5  CL 104 105 106  CO2 24 29 26   GLUCOSE 188* 122* 93  BUN 12 9 15   CREATININE 0.79 0.65 0.65  CALCIUM 9.6 9.3 9.2  MG  --  1.9 2.0   Liver Function Tests: Recent Labs  Lab 08/03/20 1043  AST 27  ALT 16  ALKPHOS 78  BILITOT 0.9  PROT 7.2  ALBUMIN 4.0   No results for input(s): LIPASE, AMYLASE in the last 168 hours. No results for input(s): AMMONIA in the last 168 hours. CBC: Recent Labs  Lab 08/03/20 1043 08/05/20 0516 08/06/20 0443  WBC 9.1 3.8* 2.8*  NEUTROABS 8.0*  --   --   HGB 13.2 11.2* 10.9*  HCT 38.5 33.5* 32.1*  MCV 91.9  92.0 90.9  PLT 164 156 177   Cardiac Enzymes: No results for input(s): CKTOTAL, CKMB, CKMBINDEX, TROPONINI in the last 168 hours. BNP: Invalid input(s): POCBNP CBG: Recent Labs  Lab 08/05/20 1946  GLUCAP 119*   D-Dimer No results for input(s): DDIMER in the last 72 hours. Hgb A1c No results for input(s): HGBA1C in the last 72 hours. Lipid Profile No results for input(s): CHOL, HDL, LDLCALC, TRIG, CHOLHDL, LDLDIRECT in the last 72 hours. Thyroid function studies No results for input(s): TSH, T4TOTAL, T3FREE, THYROIDAB in the last 72 hours.  Invalid input(s): FREET3 Anemia work up No results for input(s): VITAMINB12, FOLATE, FERRITIN, TIBC, IRON, RETICCTPCT in the last 72 hours. Urinalysis  Component Value Date/Time   COLORURINE YELLOW (A) 08/03/2020 1323   APPEARANCEUR CLOUDY (A) 08/03/2020 1323   LABSPEC 1.013 08/03/2020 1323   PHURINE 6.0 08/03/2020 1323   GLUCOSEU NEGATIVE 08/03/2020 1323   HGBUR SMALL (A) 08/03/2020 1323   BILIRUBINUR NEGATIVE 08/03/2020 1323   KETONESUR 20 (A) 08/03/2020 1323   PROTEINUR NEGATIVE 08/03/2020 1323   NITRITE POSITIVE (A) 08/03/2020 1323   LEUKOCYTESUR MODERATE (A) 08/03/2020 1323   Sepsis Labs Invalid input(s): PROCALCITONIN,  WBC,  LACTICIDVEN Microbiology Recent Results (from the past 240 hour(s))  Blood culture (routine x 2)     Status: None (Preliminary result)   Collection Time: 08/03/20 10:55 AM   Specimen: BLOOD LEFT HAND  Result Value Ref Range Status   Specimen Description BLOOD LEFT HAND  Final   Special Requests   Final    BOTTLES DRAWN AEROBIC AND ANAEROBIC Blood Culture adequate volume   Culture   Final    NO GROWTH 3 DAYS Performed at [facility], [address] [place], [place] [address]    Report Status PENDING  Incomplete  Blood culture (routine x 2)     Status: None (Preliminary result)   Collection Time: 08/03/20 11:00 AM   Specimen: Left Antecubital; Blood  Result Value Ref Range Status    Specimen Description LEFT ANTECUBITAL  Final   Special Requests   Final    BOTTLES DRAWN AEROBIC AND ANAEROBIC Blood Culture adequate volume   Culture   Final    NO GROWTH 3 DAYS Performed at [facility], [address] [place], [place] [address]    Report Status PENDING  Incomplete  Resp Panel by RT-PCR (Flu A&B, Covid) Nasopharyngeal Swab     Status: None   Collection Time: 08/03/20 11:58 AM   Specimen: Nasopharyngeal Swab; Nasopharyngeal(NP) swabs in vial transport medium  Result Value Ref Range Status   SARS Coronavirus 2 by RT PCR NEGATIVE NEGATIVE Final    Comment: (NOTE) SARS-CoV-2 target nucleic acids are NOT DETECTED.  The SARS-CoV-2 RNA is generally detectable in upper respiratory specimens during the acute phase of infection. The lowest concentration of SARS-CoV-2 viral copies this assay can detect is 138 copies/mL. A negative result does not preclude SARS-Cov-2 infection and should not be used as the sole basis for treatment or other patient management decisions. A negative result may occur with  improper specimen collection/handling, submission of specimen other than nasopharyngeal swab, presence of viral mutation(s) within the areas targeted by this assay, and inadequate number of viral copies(<138 copies/mL). A negative result must be combined with clinical observations, patient history, and epidemiological information. The expected result is Negative.  Fact Sheet for Patients:  BloggerCourse.comhttps://www.fda.gov/media/152166/download  Fact Sheet for Healthcare Providers:  SeriousBroker.ithttps://www.fda.gov/media/152162/download  This test is no t yet approved or cleared by the [place] FDA and  has been authorized for detection and/or diagnosis of SARS-CoV-2 by FDA under an Emergency Use Authorization (EUA). This EUA will remain  in effect (meaning this test can be used) for the duration of the COVID-19 declaration under Section 564(b)(1) of the Act, 21 U.S.C.section  360bbb-3(b)(1), unless the authorization is terminated  or revoked sooner.       Influenza A by PCR NEGATIVE NEGATIVE Final   Influenza B by PCR NEGATIVE NEGATIVE Final    Comment: (NOTE) The Xpert Xpress SARS-CoV-2/FLU/RSV plus assay is intended as an aid in the diagnosis of influenza from Nasopharyngeal swab specimens and should not be used as a sole basis for treatment.  Nasal washings and aspirates are unacceptable for Xpert Xpress SARS-CoV-2/FLU/RSV testing.  Fact Sheet for Patients: BloggerCourse.com  Fact Sheet for Healthcare Providers: SeriousBroker.it  This test is not yet approved or cleared by the [place] FDA and has been authorized for detection and/or diagnosis of SARS-CoV-2 by FDA under an Emergency Use Authorization (EUA). This EUA will remain in effect (meaning this test can be used) for the duration of the COVID-19 declaration under Section 564(b)(1) of the Act, 21 U.S.C. section 360bbb-3(b)(1), unless the authorization is terminated or revoked.  Performed at [facility], [address] [place], [place] [address]   Urine Culture     Status: Abnormal   Collection Time: 08/03/20  1:52 PM   Specimen: Urine, Clean Catch  Result Value Ref Range Status   Specimen Description   Final    URINE, CLEAN CATCH Performed at [facility], [address] [place], [place] [address]    Special Requests   Final    NONE Performed at [facility], [address] [place], [place] [address]    Culture 40,000 COLONIES/mL ESCHERICHIA COLI (A)  Final   Report Status 08/06/2020 FINAL  Final   Organism ID, Bacteria ESCHERICHIA COLI (A)  Final      Susceptibility   Escherichia coli - MIC*    AMPICILLIN 4 SENSITIVE Sensitive     CEFAZOLIN <=4 SENSITIVE Sensitive     CEFEPIME <=0.12 SENSITIVE Sensitive     CEFTRIAXONE <=0.25 SENSITIVE Sensitive     CIPROFLOXACIN <=0.25 SENSITIVE Sensitive      GENTAMICIN <=1 SENSITIVE Sensitive     IMIPENEM <=0.25 SENSITIVE Sensitive     NITROFURANTOIN <=16 SENSITIVE Sensitive     TRIMETH/SULFA <=20 SENSITIVE Sensitive     AMPICILLIN/SULBACTAM <=2 SENSITIVE Sensitive     PIP/TAZO <=4 SENSITIVE Sensitive     * 40,000 COLONIES/mL ESCHERICHIA COLI     Total time spend on discharging this patient, including the last patient exam, discussing the hospital stay, instructions for ongoing care as it relates to all pertinent caregivers, as well as preparing the medical discharge records, prescriptions, and/or referrals as applicable, is 35 minutes.    Darlin Priestly, MD  [facility] 08/06/2020, 11:30 AM

## 2020-08-08 LAB — CULTURE, BLOOD (ROUTINE X 2)
Culture: NO GROWTH
Culture: NO GROWTH
Special Requests: ADEQUATE
Special Requests: ADEQUATE

## 2020-08-30 ENCOUNTER — Other Ambulatory Visit: Payer: Self-pay

## 2020-08-30 ENCOUNTER — Ambulatory Visit: Payer: Medicare Other | Attending: Family Medicine

## 2020-08-30 DIAGNOSIS — M542 Cervicalgia: Secondary | ICD-10-CM | POA: Diagnosis not present

## 2020-08-30 DIAGNOSIS — M6281 Muscle weakness (generalized): Secondary | ICD-10-CM | POA: Insufficient documentation

## 2020-08-30 DIAGNOSIS — R2689 Other abnormalities of gait and mobility: Secondary | ICD-10-CM | POA: Insufficient documentation
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
## 2020-08-31 NOTE — Addendum Note (Signed)
Addended by: Ria Comment D on: 08/31/2020 01:10 PM   Modules accepted: Orders

## 2020-09-04 ENCOUNTER — Ambulatory Visit: Payer: Medicare Other

## 2020-09-04 ENCOUNTER — Other Ambulatory Visit: Payer: Self-pay

## 2020-09-04 DIAGNOSIS — M6281 Muscle weakness (generalized): Secondary | ICD-10-CM

## 2020-09-04 DIAGNOSIS — M542 Cervicalgia: Secondary | ICD-10-CM | POA: Diagnosis not present

## 2020-09-04 NOTE — Therapy (Signed)
[facility] [address] [place], [place], [address] Phone: [phone]   Fax:  [phone]  Physical Therapy Treatment  Patient Details  Name: Marilyn CloudHeidemarie Keith MRN: [id number] Date of Birth: May 27, 1945 Referring Provider (PT): Cleon DewAndrea Mulholland FNP   Encounter Date: 09/04/2020   PT End of Session - 09/04/20 1302     Visit Number 2    Number of Visits 25    Date for PT Re-Evaluation 11/22/20    Authorization Type eval: 08/30/20    PT Start Time 1315    PT Stop Time 1400    PT Time Calculation (min) 45 min    Activity Tolerance Patient limited by pain    Behavior During Therapy [facility] for tasks assessed/performed             Past Medical History:  Diagnosis Date   Chronic back pain    GERD (gastroesophageal reflux disease)    Hypertension     Past Surgical History:  Procedure Laterality Date   BACK SURGERY     EYE SURGERY     MASTECTOMY     REPLACEMENT TOTAL KNEE BILATERAL     SHOULDER SURGERY Right     There were no vitals filed for this visit.   Subjective Assessment - 09/04/20 1301     Subjective Patient reports she is doing all right today.  She continues with her chronic neck pain however it is not particularly painful at the moment.  She would like to address her bilateral knee pain and right knee instability as well as imbalance during session today.    Pertinent History Pt complains of neck and bilateral shoulder pain for the last 10 years. "If I don't take my Lyrica it hurts so bad that I cry." She also complains of constant headaches on the crown of her head. She takes a lot of Excedrin for the headaches. She also complains of bilateral shoulder pain which is independent from her neck. History of 3 rotator cuffs on her R shoulder and is currently unable to lift her R arm. She has been advised that she needs surgery for her shoulder but she is not interested in surgical intervention at this time. She has a  history of chronic back pain and reports extensive spinal fusions. "I'm all fused except for my neck." She reports a remote cervical fusion as well but does not recall which level. She complains of chronic R hip pain which prevents her from laying on her R side. She has had bilateral TKR and states that this caused a leg length difference in her lower extremities. Currently she wears a lift in her R shoe. She has a lot of R knee instability and wears a brace on her R knee to prevent it from buckling. Pt has had two to three falls in the last 6 months and many "almost falls." She uses and single point cane in RUE when out in the community and uses a rollator at home. History of physical therapy after her bilateral TKR and her spinal fusions. She also had an evaluation for her gait/balance in [place] but due to COVID pandemic was unable to return for treatment. While living in [place] she was getting trigger point injections for her neck pain which were helpful however since moving to [facility] she has not had any injections. She has had spinal injections in the past as well with very brief relief. History of R breast cancer s/p bilateral mastectomy. She has  RUE lymphedema and pt has a home compression unit but she doesn't use it because she states that it doesn't help.    Limitations Walking;Standing;Lifting    Diagnostic tests See history    Patient Stated Goals Decrease neck pain, improve R knee stability/gait/balance                [facility] PT Assessment - 09/04/20 1353       Standardized Balance Assessment   Standardized Balance Assessment Berg Balance Test      Berg Balance Test   Sit to Stand Able to stand without using hands and stabilize independently    Standing Unsupported Able to stand safely 2 minutes    Sitting with Back Unsupported but Feet Supported on Floor or Stool Able to sit safely and securely 2 minutes    Stand to Sit Sits safely with minimal use of hands     Transfers Able to transfer safely, definite need of hands    Standing Unsupported with Eyes Closed Able to stand 10 seconds with supervision    Standing Unsupported with Feet Together Able to place feet together independently and stand for 1 minute with supervision    From Standing, Reach Forward with Outstretched Arm Can reach forward >5 cm safely (2")    From Standing Position, Pick up Object from Floor Able to pick up shoe, needs supervision    From Standing Position, Turn to Look Behind Over each Shoulder Turn sideways only but maintains balance    Turn 360 Degrees Able to turn 360 degrees safely but slowly    Standing Unsupported, Alternately Place Feet on Step/Stool Able to stand independently and complete 8 steps >20 seconds    Standing Unsupported, One Foot in Front Able to take small step independently and hold 30 seconds    Standing on One Leg Tries to lift leg/unable to hold 3 seconds but remains standing independently    Total Score 40                  TREATMENT   SUBJECTIVE R knee pain instability and pain for the last couple years. Wears a brace on R knee. Bilateral TKR 2013/2014. Pt reports numbness in both feet in a stocking distribution, never into lower legs. States that she saw orthopedics and they put her in the R knee brace. No surgery recommended at this time. Reports history of bilateral LE sciatica.  R knee pain: 0/10 Present, 0/10 Best, 10/10 Worst Aggravating factors: kneeling down, squatting, instability Knee surgery: Yes, bilateral TKR 2013/2014 Pain quality: aching   OBJECTIVE  MUSCULOSKELETAL: Tremor: Absent Bulk: Normal Tone: Normal, no spasticity, rigidity, or clonus  Palpation Pain to palpation along medial and lateral joint line of right knee. Pain to palpation generally over quadriceps and hamstrings. No pain over patellar tendon.  Strength R/L 4/4+ Hip flexion 4/4 Hip external rotation 4-/4- Hip internal rotation 4+/5 Knee  extension 5/5 Knee flexion (seated) 4/5 Ankle Dorsiflexion *indicates pain  AROM Knee flexion grossly WNL bilaterally. At least 10 degrees of R knee hyperextension noted. Normal extension of L knee to 0.   NEUROLOGICAL: Sensation Grossly intact to light touch bilateral LEs as determined by testing dermatomes L2-S2 however prior to testing pt reports decreased sensation in bilateral feet. Denies prior diagnosis of peripheral neuropathy. Proprioception and hot/cold testing deferred on this date  Reflexes Deferred  SPECIAL TESTS Ligamentous Stability  (right knee) Anterior Drawer Test: Positive (increased laxity compared to right side) Lachman Test: Positive (increased laxity  compared to R side) Posterior Drawer Test: Negative Posterior Sag Sign: Negative Valgus Stress Test: Negative Varus Stress Test: Positive, severely unstable with excessive R knee varus and apparent full tear of R LCL;  BERG: 40/56 5TSTS: 12.2s 63m gait speed: self-selected: 26.8s = 0.37 m/s, fastest: 14.9s = 0.67 m/s             Patient request assessment of bilateral knee pain with right knee instability.  Patient with excessive right knee hyperextension.  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  She also has excessive anterior translation during Lachman's testing of right knee. Pain to palpation along medial and lateral joint line of right knee. Pain to palpation generally over quadriceps and hamstrings.  Additional gait and balance testing performed with moderate to severe balance impairments noted with patient scoring 40/56 on the BERG.  Decreased self-selected 15m gait speed indicates limited community ambulation. Pt will benefit from PT services to address deficits in pain, strength, balance, and mobility in order to improve function at home and decreased risk for falls.           PT Short Term Goals - 08/30/20 1702       PT SHORT TERM GOAL  #1   Title Pt will be independent with HEP in order to decrease neck pain and improve balance/gait to improve pain-free function at home and decrease risk for falls    Time 6    Period Weeks    Status New    Target Date 10/11/20               PT Long Term Goals - 09/04/20 1521       PT LONG TERM GOAL #1   Title Pt will demonstrate decrease in NDI by at least 19% in order to demonstrate clinically significant reduction in disability related to neck pain    Baseline 08/30/20: 58%    Time 12    Period Weeks    Status New    Target Date 11/22/20      PT LONG TERM GOAL #2   Title Pt will decrease worst neck pain as reported on NPRS by at least 2 points in order to demonstrate clinically significant reduction in back pain.    Baseline 08/30/20: worst 8/10    Time 8    Period Weeks    Status New    Target Date 11/22/20      PT LONG TERM GOAL #3   Title Pt will report at least 30% improvement in her neck symptoms in order to improve pain-free function at hom    Time 8    Period Weeks    Status New    Target Date 11/22/20      PT LONG TERM GOAL #4   Title Pt will increase self-selected by at least 0.13 m/s in order to demonstrate clinically significant improvement in community ambulation.    Baseline 09/04/20: self-selected: 26.8s = 0.37 m/s    Time 8    Period Weeks    Status New    Target Date 11/22/20      PT LONG TERM GOAL #5   Title Pt will improve BERG by at least 3 points in order to demonstrate clinically significant improvement in balance.    Baseline 09/04/20: 40/56    Time 8    Period Weeks    Status New    Target Date 11/22/20  Plan - 09/04/20 1303     Clinical Impression Statement Patient request assessment of bilateral knee pain with right knee instability.  Patient with excessive right knee hyperextension.  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  She also has excessive  anterior translation during Lachman's testing of right knee. Pain to palpation along medial and lateral joint line of right knee. Pain to palpation generally over quadriceps and hamstrings.  Additional gait and balance testing performed with moderate to severe balance impairments noted with patient scoring 40/56 on the BERG.  Decreased self-selected 32m gait speed indicates limited community ambulation. Pt will benefit from PT services to address deficits in pain, strength, balance, and mobility in order to improve function at home and decreased risk for falls.    Personal Factors and Comorbidities Age;Comorbidity 3+;Past/Current Experience;Time since onset of injury/illness/exacerbation    Comorbidities cervical fusion, lumbar fusion, HTN, chronic pain, TKR, R shoulder RTC tear s/p surgery    Examination-Activity Limitations Bend;Hygiene/Grooming;Dressing;Lift;Reach Overhead;Squat;Stairs;Stand    Examination-Participation Restrictions Cleaning;Community Activity;Laundry;Meal Prep;Shop    Stability/Clinical Decision Making Unstable/Unpredictable    Rehab Potential Fair    PT Frequency 2x / week    PT Duration 12 weeks    PT Treatment/Interventions ADLs/Self Care Home Management;Aquatic Therapy;Biofeedback;Canalith Repostioning;Cryotherapy;Electrical Stimulation;Iontophoresis 4mg /ml Dexamethasone;Moist Heat;Traction;Ultrasound;DME Instruction;Gait training;Stair training;Functional mobility training;Therapeutic activities;Therapeutic exercise;Balance training;Neuromuscular re-education;Patient/family education;Manual techniques;Passive range of motion;Dry needling;Vestibular;Spinal Manipulations;Joint Manipulations    PT Next Visit Plan Assess R knee instability, LE strength, and balance    PT Home Exercise Plan None currently    Consulted and Agree with Plan of Care Patient             Patient will benefit from skilled therapeutic intervention in order to improve the following deficits and  impairments:  Abnormal gait, Decreased balance, Decreased strength, Pain  Visit Diagnosis: Cervicalgia  Muscle weakness (generalized)     Problem List Patient Active Problem List   Diagnosis Date Noted   UTI (urinary tract infection) 08/03/2020   Chronic back pain    Hypertension    GERD (gastroesophageal reflux disease)    08/05/2020 Angas Isabell PT, DPT, GCS  Shakeira Rhee 09/04/2020, 3:23 PM  [place] [address] [place], [place], [place] Phone: [phone]   Fax:  [phone]  Name: Yocheved Depner MRN: Marilyn Keith Date of Birth: 12/06/1945

## 2020-09-06 ENCOUNTER — Other Ambulatory Visit: Payer: Self-pay

## 2020-09-06 ENCOUNTER — Ambulatory Visit: Payer: Medicare Other

## 2020-09-06 VITALS — BP 101/54 | HR 100

## 2020-09-06 DIAGNOSIS — M542 Cervicalgia: Secondary | ICD-10-CM | POA: Diagnosis not present

## 2020-09-06 DIAGNOSIS — M6281 Muscle weakness (generalized): Secondary | ICD-10-CM

## 2020-09-06 NOTE — Patient Instructions (Signed)
Access Code: [email or web address] URL: [email or web address] Date: 09/06/2020 Prepared by: Ria Comment  Exercises Hooklying Clamshell with Resistance - 1 x daily - 7 x weekly - 2 sets - 10 reps - 3s hold Supine Hip Adduction Isometric with Ball - 1 x daily - 7 x weekly - 2 sets - 10 reps - 3s hold Supine March - 1 x daily - 7 x weekly - 2 sets - 10 reps - 3s hold

## 2020-09-06 NOTE — Therapy (Signed)
[facility] [address] [place], [place], [address] Phone: [phone]   Fax:  [phone]  Physical Therapy Treatment  Patient Details  Name: Marilyn Keith MRN: [id number] Date of Birth: 14-Jul-1945 Referring Provider (PT): Cleon Dew FNP   Encounter Date: 09/06/2020   PT End of Session - 09/06/20 1154     Visit Number 3    Number of Visits 25    Date for PT Re-Evaluation 11/22/20    Authorization Type eval: 08/30/20    PT Start Time 1145    PT Stop Time 1220    PT Time Calculation (min) 35 min    Activity Tolerance Patient limited by pain    Behavior During Therapy [facility] for tasks assessed/performed             Past Medical History:  Diagnosis Date   Chronic back pain    GERD (gastroesophageal reflux disease)    Hypertension     Past Surgical History:  Procedure Laterality Date   BACK SURGERY     EYE SURGERY     MASTECTOMY     REPLACEMENT TOTAL KNEE BILATERAL     SHOULDER SURGERY Right     Vitals:   09/06/20 1149  BP: (!) 101/54  Pulse: 100  SpO2: 99%     Subjective Assessment - 09/06/20 1152     Subjective Patient reports she is doing all right today. She continues with her chronic neck, shoulder, and knee pain. No falls since last therapy session. No specific questions or concerns.    Pertinent History Pt complains of neck and bilateral shoulder pain for the last 10 years. "If I don't take my Lyrica it hurts so bad that I cry." She also complains of constant headaches on the crown of her head. She takes a lot of Excedrin for the headaches. She also complains of bilateral shoulder pain which is independent from her neck. History of 3 rotator cuffs on her R shoulder and is currently unable to lift her R arm. She has been advised that she needs surgery for her shoulder but she is not interested in surgical intervention at this time. She has a history of chronic back pain and reports extensive spinal  fusions. "I'm all fused except for my neck." She reports a remote cervical fusion as well but does not recall which level. She complains of chronic R hip pain which prevents her from laying on her R side. She has had bilateral TKR and states that this caused a leg length difference in her lower extremities. Currently she wears a lift in her R shoe. She has a lot of R knee instability and wears a brace on her R knee to prevent it from buckling. Pt has had two to three falls in the last 6 months and many "almost falls." She uses and single point cane in RUE when out in the community and uses a rollator at home. History of physical therapy after her bilateral TKR and her spinal fusions. She also had an evaluation for her gait/balance in [place] but due to COVID pandemic was unable to return for treatment. While living in [place] she was getting trigger point injections for her neck pain which were helpful however since moving to [facility] she has not had any injections. She has had spinal injections in the past as well with very brief relief. History of R breast cancer s/p bilateral mastectomy. She has RUE lymphedema and pt has a home compression  unit but she doesn't use it because she states that it doesn't help.    Limitations Walking;Standing;Lifting    Diagnostic tests See history    Patient Stated Goals Decrease neck pain, improve R knee stability/gait/balance               TREATMENT   Ther-ex  NuStep L0 x 5 minutes for warm-up during history (3 minutes unbilled); Supine SLR hip flexion x 10 BLE, increase in R hip pain so discontinued; Hooklying marching 2 x 10 BLE; Hooklying clams 2 x 10 BLE, with green tband; Hooklying adductor ball squeeze 2 x 10 BLE; Supine straight leg hip abduction x 10 BLE, with manual resistance above knee joint;  Supine straight leg hip adduction x 10 BLE, with manual resistance above knee joint; Hooklying partial bridges with arms across chest x 10; HEP  issued and education provided;   Pt educated throughout session about proper posture and technique with exercises. Improved exercise technique, movement at target joints, use of target muscles after min to mod verbal, visual, tactile cues.     Initiated mat table strengthening with patient today to minimize neck pain while improving leg strength.  Patient requires regular rest breaks due to fatigue and muscle soreness.  Session abbreviated to minimize excessive fatigue/soreness.  HEP initiated today with education and handout provided to patient. Pt will benefit from PT services to address deficits in pain, strength, balance, and mobility in order to improve function at home and decreased risk for falls.           PT Short Term Goals - 08/30/20 1702       PT SHORT TERM GOAL #1   Title Pt will be independent with HEP in order to decrease neck pain and improve balance/gait to improve pain-free function at home and decrease risk for falls    Time 6    Period Weeks    Status New    Target Date 10/11/20               PT Long Term Goals - 09/04/20 1521       PT LONG TERM GOAL #1   Title Pt will demonstrate decrease in NDI by at least 19% in order to demonstrate clinically significant reduction in disability related to neck pain    Baseline 08/30/20: 58%    Time 12    Period Weeks    Status New    Target Date 11/22/20      PT LONG TERM GOAL #2   Title Pt will decrease worst neck pain as reported on NPRS by at least 2 points in order to demonstrate clinically significant reduction in back pain.    Baseline 08/30/20: worst 8/10    Time 8    Period Weeks    Status New    Target Date 11/22/20      PT LONG TERM GOAL #3   Title Pt will report at least 30% improvement in her neck symptoms in order to improve pain-free function at hom    Time 8    Period Weeks    Status New    Target Date 11/22/20      PT LONG TERM GOAL #4   Title Pt will increase self-selected by at  least 0.13 m/s in order to demonstrate clinically significant improvement in community ambulation.    Baseline 09/04/20: self-selected: 26.8s = 0.37 m/s    Time 8    Period Weeks    Status New  Target Date 11/22/20      PT LONG TERM GOAL #5   Title Pt will improve BERG by at least 3 points in order to demonstrate clinically significant improvement in balance.    Baseline 09/04/20: 40/56    Time 8    Period Weeks    Status New    Target Date 11/22/20                   Plan - 09/06/20 1154     Clinical Impression Statement Initiated mat table strengthening with patient today to minimize neck pain while improving leg strength.  Patient requires regular rest breaks due to fatigue and muscle soreness.  Session abbreviated to minimize excessive fatigue/soreness.  HEP initiated today with education and handout provided to patient. Pt will benefit from PT services to address deficits in pain, strength, balance, and mobility in order to improve function at home and decreased risk for falls    Personal Factors and Comorbidities Age;Comorbidity 3+;Past/Current Experience;Time since onset of injury/illness/exacerbation    Comorbidities cervical fusion, lumbar fusion, HTN, chronic pain, TKR, R shoulder RTC tear s/p surgery    Examination-Activity Limitations Bend;Hygiene/Grooming;Dressing;Lift;Reach Overhead;Squat;Stairs;Stand    Examination-Participation Restrictions Cleaning;Community Activity;Laundry;Meal Prep;Shop    Stability/Clinical Decision Making Unstable/Unpredictable    Rehab Potential Fair    PT Frequency 2x / week    PT Duration 12 weeks    PT Treatment/Interventions ADLs/Self Care Home Management;Aquatic Therapy;Biofeedback;Canalith Repostioning;Cryotherapy;Electrical Stimulation;Iontophoresis 4mg /ml Dexamethasone;Moist Heat;Traction;Ultrasound;DME Instruction;Gait training;Stair training;Functional mobility training;Therapeutic activities;Therapeutic exercise;Balance  training;Neuromuscular re-education;Patient/family education;Manual techniques;Passive range of motion;Dry needling;Vestibular;Spinal Manipulations;Joint Manipulations    PT Next Visit Plan LE strengthening, balance, neck modalities/strengthening    PT Home Exercise Plan Access Code:    Consulted and Agree with Plan of Care Patient             Patient will benefit from skilled therapeutic intervention in order to improve the following deficits and impairments:  Abnormal gait, Decreased balance, Decreased strength, Pain  Visit Diagnosis: Muscle weakness (generalized)  Cervicalgia     Problem List Patient Active Problem List   Diagnosis Date Noted   UTI (urinary tract infection) 08/03/2020   Chronic back pain    Hypertension    GERD (gastroesophageal reflux disease)    08/05/2020 Shawnya Mayor PT, DPT, GCS  Emit Kuenzel 09/06/2020, 4:35 PM  [facility] [address] [place], [place], [place] Phone: [phone]   Fax:  [phone]  Name: Marilyn Keith MRN: Rinaldo Cloud Date of Birth: Jan 10, 1946

## 2020-09-10 ENCOUNTER — Other Ambulatory Visit: Payer: Self-pay

## 2020-09-10 ENCOUNTER — Ambulatory Visit: Payer: Medicare Other

## 2020-09-10 DIAGNOSIS — M542 Cervicalgia: Secondary | ICD-10-CM

## 2020-09-10 DIAGNOSIS — R2689 Other abnormalities of gait and mobility: Secondary | ICD-10-CM

## 2020-09-10 DIAGNOSIS — M6281 Muscle weakness (generalized): Secondary | ICD-10-CM

## 2020-09-10 NOTE — Therapy (Signed)
[facility] [address] [place], [place], [address] Phone: [phone]   Fax:  [phone]  Physical Therapy Treatment  Patient Details  Name: Marilyn Keith MRN: [id number] Date of Birth: 1945/07/15 Referring Provider (PT): Cleon Dew FNP   Encounter Date: 09/10/2020   PT End of Session - 09/10/20 1539     Visit Number 4    Number of Visits 25    Date for PT Re-Evaluation 11/22/20    Authorization Type eval: 08/30/20    PT Start Time 1530    PT Stop Time 1600    PT Time Calculation (min) 30 min    Activity Tolerance Patient limited by pain    Behavior During Therapy [facility] for tasks assessed/performed              Past Medical History:  Diagnosis Date   Chronic back pain    GERD (gastroesophageal reflux disease)    Hypertension     Past Surgical History:  Procedure Laterality Date   BACK SURGERY     EYE SURGERY     MASTECTOMY     REPLACEMENT TOTAL KNEE BILATERAL     SHOULDER SURGERY Right     There were no vitals filed for this visit.    Subjective Assessment - 09/10/20 1539     Subjective Patient reports she is having a lot of generalized pain upon arrival today. She continues with her chronic neck, shoulder, and knee pain. No falls since last therapy session. No specific questions or concerns.    Pertinent History Pt complains of neck and bilateral shoulder pain for the last 10 years. "If I don't take my Lyrica it hurts so bad that I cry." She also complains of constant headaches on the crown of her head. She takes a lot of Excedrin for the headaches. She also complains of bilateral shoulder pain which is independent from her neck. History of 3 rotator cuffs on her R shoulder and is currently unable to lift her R arm. She has been advised that she needs surgery for her shoulder but she is not interested in surgical intervention at this time. She has a history of chronic back pain and reports extensive  spinal fusions. "I'm all fused except for my neck." She reports a remote cervical fusion as well but does not recall which level. She complains of chronic R hip pain which prevents her from laying on her R side. She has had bilateral TKR and states that this caused a leg length difference in her lower extremities. Currently she wears a lift in her R shoe. She has a lot of R knee instability and wears a brace on her R knee to prevent it from buckling. Pt has had two to three falls in the last 6 months and many "almost falls." She uses and single point cane in RUE when out in the community and uses a rollator at home. History of physical therapy after her bilateral TKR and her spinal fusions. She also had an evaluation for her gait/balance in [place] but due to COVID pandemic was unable to return for treatment. While living in [place] she was getting trigger point injections for her neck pain which were helpful however since moving to [facility] she has not had any injections. She has had spinal injections in the past as well with very brief relief. History of R breast cancer s/p bilateral mastectomy. She has RUE lymphedema and pt has a home compression unit but  she doesn't use it because she states that it doesn't help.    Limitations Walking;Standing;Lifting    Diagnostic tests See history    Patient Stated Goals Decrease neck pain, improve R knee stability/gait/balance                TREATMENT   Ther-ex  NuStep L1-2 x 5 minutes for warm-up during history (3 minutes unbilled); Hooklying marching 2 x 10 BLE; Hooklying clams 2 x 10 BLE, with green tband; Hooklying adductor ball squeeze 2 x 10 BLE; Supine straight leg hip abduction x 10 BLE, with manual resistance above knee joint;  Supine straight leg hip adduction x 10 BLE, with manual resistance above knee joint; Hooklying partial bridges with arms across chest x 10;   Pt educated throughout session about proper posture and  technique with exercises. Improved exercise technique, movement at target joints, use of target muscles after min to mod verbal, visual, tactile cues.     Continued mat table strengthening with patient today to minimize neck pain while improving leg strength due to high reports of pain today. Patient requires regular rest breaks due to fatigue and muscle soreness.  Session abbreviated to minimize excessive fatigue/soreness. Pt advised to continue HEP and follow-up as scheduled. Pt will benefit from PT services to address deficits in pain, strength, balance, and mobility in order to improve function at home and decreased risk for falls.           PT Short Term Goals - 08/30/20 1702       PT SHORT TERM GOAL #1   Title Pt will be independent with HEP in order to decrease neck pain and improve balance/gait to improve pain-free function at home and decrease risk for falls    Time 6    Period Weeks    Status New    Target Date 10/11/20               PT Long Term Goals - 09/04/20 1521       PT LONG TERM GOAL #1   Title Pt will demonstrate decrease in NDI by at least 19% in order to demonstrate clinically significant reduction in disability related to neck pain    Baseline 08/30/20: 58%    Time 12    Period Weeks    Status New    Target Date 11/22/20      PT LONG TERM GOAL #2   Title Pt will decrease worst neck pain as reported on NPRS by at least 2 points in order to demonstrate clinically significant reduction in back pain.    Baseline 08/30/20: worst 8/10    Time 8    Period Weeks    Status New    Target Date 11/22/20      PT LONG TERM GOAL #3   Title Pt will report at least 30% improvement in her neck symptoms in order to improve pain-free function at hom    Time 8    Period Weeks    Status New    Target Date 11/22/20      PT LONG TERM GOAL #4   Title Pt will increase self-selected by at least 0.13 m/s in order to demonstrate clinically significant improvement  in community ambulation.    Baseline 09/04/20: self-selected: 26.8s = 0.37 m/s    Time 8    Period Weeks    Status New    Target Date 11/22/20      PT LONG TERM GOAL #5  Title Pt will improve BERG by at least 3 points in order to demonstrate clinically significant improvement in balance.    Baseline 09/04/20: 40/56    Time 8    Period Weeks    Status New    Target Date 11/22/20                   Plan - 09/10/20 1543     Clinical Impression Statement Continued mat table strengthening with patient today to minimize neck pain while improving leg strength due to high reports of pain today. Patient requires regular rest breaks due to fatigue and muscle soreness.  Session abbreviated to minimize excessive fatigue/soreness. Pt advised to continue HEP and follow-up as scheduled. Pt will benefit from PT services to address deficits in pain, strength, balance, and mobility in order to improve function at home and decreased risk for falls.    Personal Factors and Comorbidities Age;Comorbidity 3+;Past/Current Experience;Time since onset of injury/illness/exacerbation    Comorbidities cervical fusion, lumbar fusion, HTN, chronic pain, TKR, R shoulder RTC tear s/p surgery    Examination-Activity Limitations Bend;Hygiene/Grooming;Dressing;Lift;Reach Overhead;Squat;Stairs;Stand    Examination-Participation Restrictions Cleaning;Community Activity;Laundry;Meal Prep;Shop    Stability/Clinical Decision Making Unstable/Unpredictable    Rehab Potential Fair    PT Frequency 2x / week    PT Duration 12 weeks    PT Treatment/Interventions ADLs/Self Care Home Management;Aquatic Therapy;Biofeedback;Canalith Repostioning;Cryotherapy;Electrical Stimulation;Iontophoresis 4mg /ml Dexamethasone;Moist Heat;Traction;Ultrasound;DME Instruction;Gait training;Stair training;Functional mobility training;Therapeutic activities;Therapeutic exercise;Balance training;Neuromuscular re-education;Patient/family  education;Manual techniques;Passive range of motion;Dry needling;Vestibular;Spinal Manipulations;Joint Manipulations    PT Next Visit Plan LE strengthening, balance, neck modalities/strengthening    PT Home Exercise Plan Access Code:    Consulted and Agree with Plan of Care Patient              Patient will benefit from skilled therapeutic intervention in order to improve the following deficits and impairments:  Abnormal gait, Decreased balance, Decreased strength, Pain  Visit Diagnosis: Muscle weakness (generalized)  Cervicalgia  Other abnormalities of gait and mobility     Problem List Patient Active Problem List   Diagnosis Date Noted   UTI (urinary tract infection) 08/03/2020   Chronic back pain    Hypertension    GERD (gastroesophageal reflux disease)    08/05/2020 Tashema Tiller PT, DPT, GCS  Jamill Wetmore 09/11/2020, 8:54 PM  [facility] [address]. [place], [place], [place] Phone: [phone]   Fax:  [phone]  Name: Marilyn Keith MRN: Rinaldo Cloud Date of Birth: [birth year]

## 2020-09-13 ENCOUNTER — Other Ambulatory Visit: Payer: Self-pay

## 2020-09-13 ENCOUNTER — Ambulatory Visit: Payer: Medicare Other | Attending: Family Medicine

## 2020-09-13 DIAGNOSIS — M6281 Muscle weakness (generalized): Secondary | ICD-10-CM | POA: Insufficient documentation

## 2020-09-13 DIAGNOSIS — R2689 Other abnormalities of gait and mobility: Secondary | ICD-10-CM | POA: Insufficient documentation

## 2020-09-13 DIAGNOSIS — M542 Cervicalgia: Secondary | ICD-10-CM

## 2020-09-13 NOTE — Therapy (Signed)
[facility] [place] [facility] [address] [place], [place], [address] Phone: [phone]   Fax:  [phone]  Physical Therapy Treatment  Patient Details  Name: Marilyn Keith MRN: [id number] Date of Birth: 1945/10/15 Referring Provider (PT): Cleon Dew FNP   Encounter Date: 09/13/2020   PT End of Session - 09/13/20 1208     Visit Number 5    Number of Visits 25    Date for PT Re-Evaluation 11/22/20    Authorization Type eval: 08/30/20    PT Start Time 1147    PT Stop Time 1215    PT Time Calculation (min) 28 min    Activity Tolerance Patient limited by pain    Behavior During Therapy [facility] for tasks assessed/performed              Past Medical History:  Diagnosis Date   Chronic back pain    GERD (gastroesophageal reflux disease)    Hypertension     Past Surgical History:  Procedure Laterality Date   BACK SURGERY     EYE SURGERY     MASTECTOMY     REPLACEMENT TOTAL KNEE BILATERAL     SHOULDER SURGERY Right     There were no vitals filed for this visit.    Subjective Assessment - 09/13/20 1150     Subjective Patient reports she is doing alright today. Overall her pain is improved compared to earlier this week and she states that her neck is feeling alright today. No falls since last therapy session. No specific questions or concerns.    Pertinent History Pt complains of neck and bilateral shoulder pain for the last 10 years. "If I don't take my Lyrica it hurts so bad that I cry." She also complains of constant headaches on the crown of her head. She takes a lot of Excedrin for the headaches. She also complains of bilateral shoulder pain which is independent from her neck. History of 3 rotator cuffs on her R shoulder and is currently unable to lift her R arm. She has been advised that she needs surgery for her shoulder but she is not interested in surgical intervention at this time. She has a history of chronic back pain and  reports extensive spinal fusions. "I'm all fused except for my neck." She reports a remote cervical fusion as well but does not recall which level. She complains of chronic R hip pain which prevents her from laying on her R side. She has had bilateral TKR and states that this caused a leg length difference in her lower extremities. Currently she wears a lift in her R shoe. She has a lot of R knee instability and wears a brace on her R knee to prevent it from buckling. Pt has had two to three falls in the last 6 months and many "almost falls." She uses and single point cane in RUE when out in the community and uses a rollator at home. History of physical therapy after her bilateral TKR and her spinal fusions. She also had an evaluation for her gait/balance in [place] but due to COVID pandemic was unable to return for treatment. While living in [place] she was getting trigger point injections for her neck pain which were helpful however since moving to [facility] she has not had any injections. She has had spinal injections in the past as well with very brief relief. History of R breast cancer s/p bilateral mastectomy. She has RUE lymphedema and pt has a  home compression unit but she doesn't use it because she states that it doesn't help.    Limitations Walking;Standing;Lifting    Diagnostic tests See history    Patient Stated Goals Decrease neck pain, improve R knee stability/gait/balance                TREATMENT   Ther-ex  NuStep L1-2 x 5 minutes for warm-up during history (3 minutes unbilled); Hooklying marching 2 x 10 BLE; Hooklying clams 2 x 10 BLE, with green tband; Hooklying adductor ball squeeze 2 x 10 BLE; Supine straight leg hip abduction x 10 BLE, with manual resistance above knee joint;  Supine straight leg hip adduction x 10 BLE, with manual resistance above knee joint; Hooklying partial bridges with arms across chest x 10;   Pt educated throughout session about proper  posture and technique with exercises. Improved exercise technique, movement at target joints, use of target muscles after min to mod verbal, visual, tactile cues.     Given that patient reports her pain is improved today progressed to seated and standing exercises. Patient continues to require regular rest breaks due to fatigue and muscle soreness.  Session abbreviated again today to minimize excessive fatigue/soreness.  Will stretch sessions as tolerance/endurance improves.  Pt advised to continue HEP and follow-up as scheduled. She will benefit from PT services to address deficits in pain, strength, balance, and mobility in order to improve function at home and decreased risk for falls.           PT Short Term Goals - 08/30/20 1702       PT SHORT TERM GOAL #1   Title Pt will be independent with HEP in order to decrease neck pain and improve balance/gait to improve pain-free function at home and decrease risk for falls    Time 6    Period Weeks    Status New    Target Date 10/11/20               PT Long Term Goals - 09/04/20 1521       PT LONG TERM GOAL #1   Title Pt will demonstrate decrease in NDI by at least 19% in order to demonstrate clinically significant reduction in disability related to neck pain    Baseline 08/30/20: 58%    Time 12    Period Weeks    Status New    Target Date 11/22/20      PT LONG TERM GOAL #2   Title Pt will decrease worst neck pain as reported on NPRS by at least 2 points in order to demonstrate clinically significant reduction in back pain.    Baseline 08/30/20: worst 8/10    Time 8    Period Weeks    Status New    Target Date 11/22/20      PT LONG TERM GOAL #3   Title Pt will report at least 30% improvement in her neck symptoms in order to improve pain-free function at hom    Time 8    Period Weeks    Status New    Target Date 11/22/20      PT LONG TERM GOAL #4   Title Pt will increase self-selected by at least 0.13 m/s in  order to demonstrate clinically significant improvement in community ambulation.    Baseline 09/04/20: self-selected: 26.8s = 0.37 m/s    Time 8    Period Weeks    Status New    Target Date 11/22/20  PT LONG TERM GOAL #5   Title Pt will improve BERG by at least 3 points in order to demonstrate clinically significant improvement in balance.    Baseline 09/04/20: 40/56    Time 8    Period Weeks    Status New    Target Date 11/22/20                   Plan - 09/13/20 1210     Clinical Impression Statement Given that patient reports her pain is improved today progressed to seated and standing exercises. Patient continues to require regular rest breaks due to fatigue and muscle soreness.  Session abbreviated again today to minimize excessive fatigue/soreness.  Will stretch sessions as tolerance/endurance improves.  Pt advised to continue HEP and follow-up as scheduled. She will benefit from PT services to address deficits in pain, strength, balance, and mobility in order to improve function at home and decreased risk for falls.    Personal Factors and Comorbidities Age;Comorbidity 3+;Past/Current Experience;Time since onset of injury/illness/exacerbation    Comorbidities cervical fusion, lumbar fusion, HTN, chronic pain, TKR, R shoulder RTC tear s/p surgery    Examination-Activity Limitations Bend;Hygiene/Grooming;Dressing;Lift;Reach Overhead;Squat;Stairs;Stand    Examination-Participation Restrictions Cleaning;Community Activity;Laundry;Meal Prep;Shop    Stability/Clinical Decision Making Unstable/Unpredictable    Rehab Potential Fair    PT Frequency 2x / week    PT Duration 12 weeks    PT Treatment/Interventions ADLs/Self Care Home Management;Aquatic Therapy;Biofeedback;Canalith Repostioning;Cryotherapy;Electrical Stimulation;Iontophoresis 4mg /ml Dexamethasone;Moist Heat;Traction;Ultrasound;DME Instruction;Gait training;Stair training;Functional mobility training;Therapeutic  activities;Therapeutic exercise;Balance training;Neuromuscular re-education;Patient/family education;Manual techniques;Passive range of motion;Dry needling;Vestibular;Spinal Manipulations;Joint Manipulations    PT Next Visit Plan LE strengthening, balance, neck modalities/strengthening    PT Home Exercise Plan Access Code:    Consulted and Agree with Plan of Care Patient              Patient will benefit from skilled therapeutic intervention in order to improve the following deficits and impairments:  Abnormal gait, Decreased balance, Decreased strength, Pain  Visit Diagnosis: Muscle weakness (generalized)  Cervicalgia     Problem List Patient Active Problem List   Diagnosis Date Noted   UTI (urinary tract infection) 08/03/2020   Chronic back pain    Hypertension    GERD (gastroesophageal reflux disease)    08/05/2020 Alta Shober PT, DPT, GCS  Jerrit Horen 09/13/2020, 2:33 PM  [facility] [address] [place], [place], [place] Phone: [phone]   Fax:  [phone]  Name: Marilyn Keith MRN: Rinaldo Cloud Date of Birth: [birth year]

## 2020-09-18 ENCOUNTER — Other Ambulatory Visit: Payer: Self-pay

## 2020-09-18 ENCOUNTER — Ambulatory Visit: Payer: Medicare Other

## 2020-09-18 DIAGNOSIS — R2689 Other abnormalities of gait and mobility: Secondary | ICD-10-CM

## 2020-09-18 DIAGNOSIS — M6281 Muscle weakness (generalized): Secondary | ICD-10-CM | POA: Diagnosis not present

## 2020-09-18 NOTE — Therapy (Signed)
[place] [facility] [address] [place], [place], [address] Phone: [phone]   Fax:  [phone]  Physical Therapy Treatment  Patient Details  Name: Marilyn Keith MRN: [id number] Date of Birth: [birth year] Referring Provider (PT): Cleon Dew FNP   Encounter Date: 09/18/2020   PT End of Session - 09/18/20 1153     Visit Number 6    Number of Visits 25    Date for PT Re-Evaluation 11/22/20    Authorization Type eval: 08/30/20    PT Start Time 1147    PT Stop Time 1222    PT Time Calculation (min) 35 min    Activity Tolerance Patient limited by pain    Behavior During Therapy [facility] for tasks assessed/performed              Past Medical History:  Diagnosis Date   Chronic back pain    GERD (gastroesophageal reflux disease)    Hypertension     Past Surgical History:  Procedure Laterality Date   BACK SURGERY     EYE SURGERY     MASTECTOMY     REPLACEMENT TOTAL KNEE BILATERAL     SHOULDER SURGERY Right     There were no vitals filed for this visit.    Subjective Assessment - 09/18/20 1152     Subjective Patient reports she is doing alright today but starting having sharp L lower back pain which started yesterday and has continued today. She rates the pain as 4/10 currently (was a 9/10 yesterday when pain started). No falls since last therapy session. No specific questions or concerns.    Pertinent History Pt complains of neck and bilateral shoulder pain for the last 10 years. "If I don't take my Lyrica it hurts so bad that I cry." She also complains of constant headaches on the crown of her head. She takes a lot of Excedrin for the headaches. She also complains of bilateral shoulder pain which is independent from her neck. History of 3 rotator cuffs on her R shoulder and is currently unable to lift her R arm. She has been advised that she needs surgery for her shoulder but she is not interested in surgical intervention at  this time. She has a history of chronic back pain and reports extensive spinal fusions. "I'm all fused except for my neck." She reports a remote cervical fusion as well but does not recall which level. She complains of chronic R hip pain which prevents her from laying on her R side. She has had bilateral TKR and states that this caused a leg length difference in her lower extremities. Currently she wears a lift in her R shoe. She has a lot of R knee instability and wears a brace on her R knee to prevent it from buckling. Pt has had two to three falls in the last 6 months and many "almost falls." She uses and single point cane in RUE when out in the community and uses a rollator at home. History of physical therapy after her bilateral TKR and her spinal fusions. She also had an evaluation for her gait/balance in [place] but due to COVID pandemic was unable to return for treatment. While living in [place] she was getting trigger point injections for her neck pain which were helpful however since moving to [facility] she has not had any injections. She has had spinal injections in the past as well with very brief relief. History of R breast cancer s/p bilateral  mastectomy. She has RUE lymphedema and pt has a home compression unit but she doesn't use it because she states that it doesn't help.    Limitations Walking;Standing;Lifting    Diagnostic tests See history    Patient Stated Goals Decrease neck pain, improve R knee stability/gait/balance                TREATMENT   Ther-ex  NuStep L2-4 x 10 minutes BLE onlyfor warm-up during history (5 minutes unbilled); Hooklying marching 2 x 1 minutes; Hooklying clams 2 x 1 minute, with red tband; Hooklying adductor ball squeeze 2 x 1 minute; Supine straight leg hip abduction x 10 BLE, with manual resistance above knee joint;  Supine straight leg hip adduction x 10 BLE, with manual resistance above knee joint; Hooklying partial bridges with arms  across chest 2 x 10; Hooklying heel slides with manually resisted extension x 10 BLE;   Pt educated throughout session about proper posture and technique with exercises. Improved exercise technique, movement at target joints, use of target muscles after min to mod verbal, visual, tactile cues.     Given that patient reports low back pain upon arrival performed mat table strengthening today in supine/hooklying. Patient continues to require regular rest breaks due to fatigue. Session abbreviated again today to minimize excessive fatigue/soreness.  Pt advised to continue HEP and follow-up as scheduled. She will benefit from PT services to address deficits in pain, strength, balance, and mobility in order to improve function at home and decreased risk for falls.           PT Short Term Goals - 08/30/20 1702       PT SHORT TERM GOAL #1   Title Pt will be independent with HEP in order to decrease neck pain and improve balance/gait to improve pain-free function at home and decrease risk for falls    Time 6    Period Weeks    Status New    Target Date 10/11/20               PT Long Term Goals - 09/04/20 1521       PT LONG TERM GOAL #1   Title Pt will demonstrate decrease in NDI by at least 19% in order to demonstrate clinically significant reduction in disability related to neck pain    Baseline 08/30/20: 58%    Time 12    Period Weeks    Status New    Target Date 11/22/20      PT LONG TERM GOAL #2   Title Pt will decrease worst neck pain as reported on NPRS by at least 2 points in order to demonstrate clinically significant reduction in back pain.    Baseline 08/30/20: worst 8/10    Time 8    Period Weeks    Status New    Target Date 11/22/20      PT LONG TERM GOAL #3   Title Pt will report at least 30% improvement in her neck symptoms in order to improve pain-free function at hom    Time 8    Period Weeks    Status New    Target Date 11/22/20      PT LONG TERM GOAL  #4   Title Pt will increase self-selected by at least 0.13 m/s in order to demonstrate clinically significant improvement in community ambulation.    Baseline 09/04/20: self-selected: 26.8s = 0.37 m/s    Time 8    Period Weeks  Status New    Target Date 11/22/20      PT LONG TERM GOAL #5   Title Pt will improve BERG by at least 3 points in order to demonstrate clinically significant improvement in balance.    Baseline 09/04/20: 40/56    Time 8    Period Weeks    Status New    Target Date 11/22/20                   Plan - 09/18/20 1154     Clinical Impression Statement Given that patient reports low back pain upon arrival performed mat table strengthening today in supine/hooklying. Patient continues to require regular rest breaks due to fatigue. Session abbreviated again today to minimize excessive fatigue/soreness.  Pt advised to continue HEP and follow-up as scheduled. She will benefit from PT services to address deficits in pain, strength, balance, and mobility in order to improve function at home and decreased risk for falls.?    Personal Factors and Comorbidities Age;Comorbidity 3+;Past/Current Experience;Time since onset of injury/illness/exacerbation    Comorbidities cervical fusion, lumbar fusion, HTN, chronic pain, TKR, R shoulder RTC tear s/p surgery    Examination-Activity Limitations Bend;Hygiene/Grooming;Dressing;Lift;Reach Overhead;Squat;Stairs;Stand    Examination-Participation Restrictions Cleaning;Community Activity;Laundry;Meal Prep;Shop    Stability/Clinical Decision Making Unstable/Unpredictable    Rehab Potential Fair    PT Frequency 2x / week    PT Duration 12 weeks    PT Treatment/Interventions ADLs/Self Care Home Management;Aquatic Therapy;Biofeedback;Canalith Repostioning;Cryotherapy;Electrical Stimulation;Iontophoresis 4mg /ml Dexamethasone;Moist Heat;Traction;Ultrasound;DME Instruction;Gait training;Stair training;Functional mobility  training;Therapeutic activities;Therapeutic exercise;Balance training;Neuromuscular re-education;Patient/family education;Manual techniques;Passive range of motion;Dry needling;Vestibular;Spinal Manipulations;Joint Manipulations    PT Next Visit Plan LE strengthening, balance, neck modalities/strengthening    PT Home Exercise Plan Access Code:    Consulted and Agree with Plan of Care Patient              Patient will benefit from skilled therapeutic intervention in order to improve the following deficits and impairments:  Abnormal gait, Decreased balance, Decreased strength, Pain  Visit Diagnosis: Muscle weakness (generalized)  Other abnormalities of gait and mobility     Problem List Patient Active Problem List   Diagnosis Date Noted   UTI (urinary tract infection) 08/03/2020   Chronic back pain    Hypertension    GERD (gastroesophageal reflux disease)    08/05/2020 Hilton Saephan PT, DPT, GCS  Prince Couey 09/18/2020, 12:55 PM  [facility] [address] [place], [place], [place] Phone: [phone]   Fax:  [phone]  Name: Earlean Fidalgo MRN: Rinaldo Cloud Date of Birth: [birth year]

## 2020-09-20 ENCOUNTER — Other Ambulatory Visit: Payer: Self-pay

## 2020-09-20 ENCOUNTER — Ambulatory Visit: Payer: Medicare Other

## 2020-09-20 DIAGNOSIS — M6281 Muscle weakness (generalized): Secondary | ICD-10-CM | POA: Diagnosis not present

## 2020-09-20 DIAGNOSIS — M542 Cervicalgia: Secondary | ICD-10-CM

## 2020-09-20 NOTE — Therapy (Signed)
[facility] [address] [place], [place], [address] Phone: [phone]   Fax:  [phone]  Physical Therapy Treatment  Patient Details  Name: Marilyn Keith MRN: [id number] Date of Birth: 28-Aug-1945 Referring Provider (PT): Cleon Dew FNP   Encounter Date: 09/20/2020   PT End of Session - 09/20/20 1156     Visit Number 7    Number of Visits 25    Date for PT Re-Evaluation 11/22/20    Authorization Type eval: 08/30/20    PT Start Time 1150    PT Stop Time 1225    PT Time Calculation (min) 35 min    Activity Tolerance Patient limited by pain    Behavior During Therapy [facility] for tasks assessed/performed              Past Medical History:  Diagnosis Date   Chronic back pain    GERD (gastroesophageal reflux disease)    Hypertension     Past Surgical History:  Procedure Laterality Date   BACK SURGERY     EYE SURGERY     MASTECTOMY     REPLACEMENT TOTAL KNEE BILATERAL     SHOULDER SURGERY Right     There were no vitals filed for this visit.    Subjective Assessment - 09/20/20 1147     Subjective Patient reports she is doing alright today but is very sore. She went to [facility] yesterday and the long car ride was very difficult. Sharp L lower back pain has continued. No falls since last therapy session. No specific questions or concerns.    Pertinent History Pt complains of neck and bilateral shoulder pain for the last 10 years. "If I don't take my Lyrica it hurts so bad that I cry." She also complains of constant headaches on the crown of her head. She takes a lot of Excedrin for the headaches. She also complains of bilateral shoulder pain which is independent from her neck. History of 3 rotator cuffs on her R shoulder and is currently unable to lift her R arm. She has been advised that she needs surgery for her shoulder but she is not interested in surgical intervention at this time. She has a history of chronic  back pain and reports extensive spinal fusions. "I'm all fused except for my neck." She reports a remote cervical fusion as well but does not recall which level. She complains of chronic R hip pain which prevents her from laying on her R side. She has had bilateral TKR and states that this caused a leg length difference in her lower extremities. Currently she wears a lift in her R shoe. She has a lot of R knee instability and wears a brace on her R knee to prevent it from buckling. Pt has had two to three falls in the last 6 months and many "almost falls." She uses and single point cane in RUE when out in the community and uses a rollator at home. History of physical therapy after her bilateral TKR and her spinal fusions. She also had an evaluation for her gait/balance in [place] but due to COVID pandemic was unable to return for treatment. While living in [place] she was getting trigger point injections for her neck pain which were helpful however since moving to [facility] she has not had any injections. She has had spinal injections in the past as well with very brief relief. History of R breast cancer s/p bilateral mastectomy. She has RUE lymphedema  and pt has a home compression unit but she doesn't use it because she states that it doesn't help.    Limitations Walking;Standing;Lifting    Diagnostic tests See history    Patient Stated Goals Decrease neck pain, improve R knee stability/gait/balance                TREATMENT   Ther-ex  NuStep L2-4 x 10 minutes BLE onlyfor warm-up during history (5 minutes unbilled); Hooklying marching x 30s, discontinued to R back pain; Hooklying clams 2 x 1 minute, with red tband; Hooklying adductor ball squeeze 2 x 1 minute; Supine straight leg hip abduction x 10 BLE, with manual resistance above knee joint;  Supine straight leg hip adduction x 10 BLE, with manual resistance above knee joint; Hooklying partial bridges with arms across chest 2 x  10; Hooklying heel slides with manually resisted extension x 10 BLE; Hooklying SAQ over bolster with manual resistance from therapist x 10 BLE;   Pt educated throughout session about proper posture and technique with exercises. Improved exercise technique, movement at target joints, use of target muscles after min to mod verbal, visual, tactile cues.     Given that patient reports significant soreness she elects to perform mat table strengthening today in supine/hooklying. Patient continues to require regular rest breaks due to fatigue. Session abbreviated again today to minimize excessive fatigue/soreness.  Pt advised to continue HEP and follow-up as scheduled. She will benefit from PT services to address deficits in pain, strength, balance, and mobility in order to improve function at home and decreased risk for falls.           PT Short Term Goals - 08/30/20 1702       PT SHORT TERM GOAL #1   Title Pt will be independent with HEP in order to decrease neck pain and improve balance/gait to improve pain-free function at home and decrease risk for falls    Time 6    Period Weeks    Status New    Target Date 10/11/20               PT Long Term Goals - 09/04/20 1521       PT LONG TERM GOAL #1   Title Pt will demonstrate decrease in NDI by at least 19% in order to demonstrate clinically significant reduction in disability related to neck pain    Baseline 08/30/20: 58%    Time 12    Period Weeks    Status New    Target Date 11/22/20      PT LONG TERM GOAL #2   Title Pt will decrease worst neck pain as reported on NPRS by at least 2 points in order to demonstrate clinically significant reduction in back pain.    Baseline 08/30/20: worst 8/10    Time 8    Period Weeks    Status New    Target Date 11/22/20      PT LONG TERM GOAL #3   Title Pt will report at least 30% improvement in her neck symptoms in order to improve pain-free function at hom    Time 8    Period Weeks     Status New    Target Date 11/22/20      PT LONG TERM GOAL #4   Title Pt will increase self-selected by at least 0.13 m/s in order to demonstrate clinically significant improvement in community ambulation.    Baseline 09/04/20: self-selected: 26.8s = 0.37 m/s  Time 8    Period Weeks    Status New    Target Date 11/22/20      PT LONG TERM GOAL #5   Title Pt will improve BERG by at least 3 points in order to demonstrate clinically significant improvement in balance.    Baseline 09/04/20: 40/56    Time 8    Period Weeks    Status New    Target Date 11/22/20                   Plan - 09/20/20 1157     Clinical Impression Statement Given that patient reports significant soreness she elects to perform mat table strengthening today in supine/hooklying. Patient continues to require regular rest breaks due to fatigue. Session abbreviated again today to minimize excessive fatigue/soreness.  Pt advised to continue HEP and follow-up as scheduled. She will benefit from PT services to address deficits in pain, strength, balance, and mobility in order to improve function at home and decreased risk for falls.    Personal Factors and Comorbidities Age;Comorbidity 3+;Past/Current Experience;Time since onset of injury/illness/exacerbation    Comorbidities cervical fusion, lumbar fusion, HTN, chronic pain, TKR, R shoulder RTC tear s/p surgery    Examination-Activity Limitations Bend;Hygiene/Grooming;Dressing;Lift;Reach Overhead;Squat;Stairs;Stand    Examination-Participation Restrictions Cleaning;Community Activity;Laundry;Meal Prep;Shop    Stability/Clinical Decision Making Unstable/Unpredictable    Rehab Potential Fair    PT Frequency 2x / week    PT Duration 12 weeks    PT Treatment/Interventions ADLs/Self Care Home Management;Aquatic Therapy;Biofeedback;Canalith Repostioning;Cryotherapy;Electrical Stimulation;Iontophoresis 4mg /ml Dexamethasone;Moist Heat;Traction;Ultrasound;DME  Instruction;Gait training;Stair training;Functional mobility training;Therapeutic activities;Therapeutic exercise;Balance training;Neuromuscular re-education;Patient/family education;Manual techniques;Passive range of motion;Dry needling;Vestibular;Spinal Manipulations;Joint Manipulations    PT Next Visit Plan LE strengthening, balance, neck modalities/strengthening    PT Home Exercise Plan Access Code:    Consulted and Agree with Plan of Care Patient              Patient will benefit from skilled therapeutic intervention in order to improve the following deficits and impairments:  Abnormal gait, Decreased balance, Decreased strength, Pain  Visit Diagnosis: Muscle weakness (generalized)  Cervicalgia     Problem List Patient Active Problem List   Diagnosis Date Noted   UTI (urinary tract infection) 08/03/2020   Chronic back pain    Hypertension    GERD (gastroesophageal reflux disease)    08/05/2020 Terrion Gencarelli PT, DPT, GCS  [place] Code 09/20/2020, 12:59 PM  [place] [address] [place], [place], [place] Phone: [phone]   Fax:  [phone]  Name: Flara Storti MRN: Rinaldo Cloud Date of Birth: June 24, 1945

## 2020-09-25 ENCOUNTER — Ambulatory Visit: Payer: Medicare Other

## 2020-09-25 ENCOUNTER — Other Ambulatory Visit: Payer: Self-pay

## 2020-09-25 DIAGNOSIS — M542 Cervicalgia: Secondary | ICD-10-CM

## 2020-09-25 DIAGNOSIS — M6281 Muscle weakness (generalized): Secondary | ICD-10-CM

## 2020-09-25 NOTE — Therapy (Signed)
[facility] [address] [place], [place], [address] Phone: [phone]   Fax:  [phone]  Physical Therapy Treatment  Patient Details  Name: Marilyn Keith MRN: [id number] Date of Birth: 25-Nov-1945 Referring Provider (PT): Cleon Dew FNP   Encounter Date: 09/25/2020   PT End of Session - 09/25/20 1152     Visit Number 8    Number of Visits 25    Date for PT Re-Evaluation 11/22/20    Authorization Type eval: 08/30/20    PT Start Time 1145    PT Stop Time 1220    PT Time Calculation (min) 35 min    Activity Tolerance Patient limited by pain    Behavior During Therapy [facility] for tasks assessed/performed              Past Medical History:  Diagnosis Date   Chronic back pain    GERD (gastroesophageal reflux disease)    Hypertension     Past Surgical History:  Procedure Laterality Date   BACK SURGERY     EYE SURGERY     MASTECTOMY     REPLACEMENT TOTAL KNEE BILATERAL     SHOULDER SURGERY Right     There were no vitals filed for this visit.    Subjective Assessment - 09/25/20 1150     Subjective Patient reports she is doing alright today but is having 7/10 sharp R upper back/shoulder blade pain. She had a tooth extraction on Thursday which was very painful. No falls since last therapy session. No specific questions or concerns.    Pertinent History Pt complains of neck and bilateral shoulder pain for the last 10 years. "If I don't take my Lyrica it hurts so bad that I cry." She also complains of constant headaches on the crown of her head. She takes a lot of Excedrin for the headaches. She also complains of bilateral shoulder pain which is independent from her neck. History of 3 rotator cuffs on her R shoulder and is currently unable to lift her R arm. She has been advised that she needs surgery for her shoulder but she is not interested in surgical intervention at this time. She has a history of chronic back pain  and reports extensive spinal fusions. "I'm all fused except for my neck." She reports a remote cervical fusion as well but does not recall which level. She complains of chronic R hip pain which prevents her from laying on her R side. She has had bilateral TKR and states that this caused a leg length difference in her lower extremities. Currently she wears a lift in her R shoe. She has a lot of R knee instability and wears a brace on her R knee to prevent it from buckling. Pt has had two to three falls in the last 6 months and many "almost falls." She uses and single point cane in RUE when out in the community and uses a rollator at home. History of physical therapy after her bilateral TKR and her spinal fusions. She also had an evaluation for her gait/balance in [place] but due to COVID pandemic was unable to return for treatment. While living in [place] she was getting trigger point injections for her neck pain which were helpful however since moving to [facility] she has not had any injections. She has had spinal injections in the past as well with very brief relief. History of R breast cancer s/p bilateral mastectomy. She has RUE lymphedema and pt has  a home compression unit but she doesn't use it because she states that it doesn't help.    Limitations Walking;Standing;Lifting    Diagnostic tests See history    Patient Stated Goals Decrease neck pain, improve R knee stability/gait/balance                TREATMENT   Ther-ex  NuStep L1-3 x 10 minutes BLE onlyfor warm-up during history (5 minutes unbilled);  Standing exercises with 3# ankle weights (AW): Hip flexion marches x 1 minute; Hip abduction x 1 minute; Hamstring curls x 1 minute;  Seated alternating LAQ with 3# AW x 1 minute; Seated clams x 1 minute, with red tband; Seated adductor ball squeeze x 1 minute;   Neuromuscular Re-education  Feet together eyes open horizontal and vertical head turns x 30s each;   Pt  educated throughout session about proper posture and technique with exercises. Improved exercise technique, movement at target joints, use of target muscles after min to mod verbal, visual, tactile cues.     Patient reports that she like to perform sitting and standing exercise during session today.  She continues to have significant chronic pain and today is localized in the right upper back near shoulder blade.  However she was able to perform seated and standing exercises today.  Frequent rest breaks provided during session due to fatigue and session abbreviated due to the same complaint. Pt advised to continue HEP and follow-up as scheduled. She will benefit from PT services to address deficits in pain, strength, balance, and mobility in order to improve function at home and decreased risk for falls.           PT Short Term Goals - 08/30/20 1702       PT SHORT TERM GOAL #1   Title Pt will be independent with HEP in order to decrease neck pain and improve balance/gait to improve pain-free function at home and decrease risk for falls    Time 6    Period Weeks    Status New    Target Date 10/11/20               PT Long Term Goals - 09/04/20 1521       PT LONG TERM GOAL #1   Title Pt will demonstrate decrease in NDI by at least 19% in order to demonstrate clinically significant reduction in disability related to neck pain    Baseline 08/30/20: 58%    Time 12    Period Weeks    Status New    Target Date 11/22/20      PT LONG TERM GOAL #2   Title Pt will decrease worst neck pain as reported on NPRS by at least 2 points in order to demonstrate clinically significant reduction in back pain.    Baseline 08/30/20: worst 8/10    Time 8    Period Weeks    Status New    Target Date 11/22/20      PT LONG TERM GOAL #3   Title Pt will report at least 30% improvement in her neck symptoms in order to improve pain-free function at hom    Time 8    Period Weeks    Status New     Target Date 11/22/20      PT LONG TERM GOAL #4   Title Pt will increase self-selected by at least 0.13 m/s in order to demonstrate clinically significant improvement in community ambulation.    Baseline 09/04/20: self-selected: 26.8s =  0.37 m/s    Time 8    Period Weeks    Status New    Target Date 11/22/20      PT LONG TERM GOAL #5   Title Pt will improve BERG by at least 3 points in order to demonstrate clinically significant improvement in balance.    Baseline 09/04/20: 40/56    Time 8    Period Weeks    Status New    Target Date 11/22/20                   Plan - 09/25/20 1152     Clinical Impression Statement Patient reports that she like to perform sitting and standing exercise during session today.  She continues to have significant chronic pain and today is localized in the right upper back near shoulder blade.  However she was able to perform seated and standing exercises today.  Frequent rest breaks provided during session due to fatigue and session abbreviated due to the same complaint. Pt advised to continue HEP and follow-up as scheduled. She will benefit from PT services to address deficits in pain, strength, balance, and mobility in order to improve function at home and decreased risk for falls.?    Personal Factors and Comorbidities Age;Comorbidity 3+;Past/Current Experience;Time since onset of injury/illness/exacerbation    Comorbidities cervical fusion, lumbar fusion, HTN, chronic pain, TKR, R shoulder RTC tear s/p surgery    Examination-Activity Limitations Bend;Hygiene/Grooming;Dressing;Lift;Reach Overhead;Squat;Stairs;Stand    Examination-Participation Restrictions Cleaning;Community Activity;Laundry;Meal Prep;Shop    Stability/Clinical Decision Making Unstable/Unpredictable    Rehab Potential Fair    PT Frequency 2x / week    PT Duration 12 weeks    PT Treatment/Interventions ADLs/Self Care Home Management;Aquatic Therapy;Biofeedback;Canalith  Repostioning;Cryotherapy;Electrical Stimulation;Iontophoresis 4mg /ml Dexamethasone;Moist Heat;Traction;Ultrasound;DME Instruction;Gait training;Stair training;Functional mobility training;Therapeutic activities;Therapeutic exercise;Balance training;Neuromuscular re-education;Patient/family education;Manual techniques;Passive range of motion;Dry needling;Vestibular;Spinal Manipulations;Joint Manipulations    PT Next Visit Plan LE strengthening, balance, neck modalities/strengthening    PT Home Exercise Plan Access Code:    Consulted and Agree with Plan of Care Patient              Patient will benefit from skilled therapeutic intervention in order to improve the following deficits and impairments:  Abnormal gait, Decreased balance, Decreased strength, Pain  Visit Diagnosis: Muscle weakness (generalized)  Cervicalgia     Problem List Patient Active Problem List   Diagnosis Date Noted   UTI (urinary tract infection) 08/03/2020   Chronic back pain    Hypertension    GERD (gastroesophageal reflux disease)    08/05/2020 Jarone Ostergaard PT, DPT, GCS  Amad Mau 09/25/2020, 6:58 PM  [facility] [address] [place], [place], [place] Phone: [phone]   Fax:  [phone]  Name: Jerrica Thorman MRN: Rinaldo Cloud Date of Birth: 08-02-1945

## 2020-09-27 ENCOUNTER — Ambulatory Visit: Payer: Medicare Other

## 2020-09-27 ENCOUNTER — Other Ambulatory Visit: Payer: Self-pay

## 2020-09-27 DIAGNOSIS — M6281 Muscle weakness (generalized): Secondary | ICD-10-CM | POA: Diagnosis not present

## 2020-09-27 DIAGNOSIS — R2689 Other abnormalities of gait and mobility: Secondary | ICD-10-CM

## 2020-09-27 NOTE — Therapy (Signed)
[facility] [address] [place], [place], [address] Phone: [phone]   Fax:  [phone]  Physical Therapy Treatment  Patient Details  Name: Marilyn Keith MRN: [id number] Date of Birth: [birth year] Referring Provider (PT): Cleon Dew FNP   Encounter Date: 09/27/2020   PT End of Session - 09/27/20 1510     Visit Number 9    Number of Visits 25    Date for PT Re-Evaluation 11/22/20    Authorization Type eval: 08/30/20    PT Start Time 1150    PT Stop Time 1230    PT Time Calculation (min) 40 min    Activity Tolerance Patient limited by pain    Behavior During Therapy [facility] for tasks assessed/performed               Past Medical History:  Diagnosis Date   Chronic back pain    GERD (gastroesophageal reflux disease)    Hypertension     Past Surgical History:  Procedure Laterality Date   BACK SURGERY     EYE SURGERY     MASTECTOMY     REPLACEMENT TOTAL KNEE BILATERAL     SHOULDER SURGERY Right     There were no vitals filed for this visit.    Subjective Assessment - 09/27/20 1155     Subjective Patient reports she is doing alright today but is having 6/10 sharp R sided neck pain upon arrival. It was worse this morning but has improved since taking her Lyrica. Mouth is feeling "a little bit" better since the extraction. No falls since last therapy session. No specific questions or concerns.    Pertinent History Pt complains of neck and bilateral shoulder pain for the last 10 years. "If I don't take my Lyrica it hurts so bad that I cry." She also complains of constant headaches on the crown of her head. She takes a lot of Excedrin for the headaches. She also complains of bilateral shoulder pain which is independent from her neck. History of 3 rotator cuffs on her R shoulder and is currently unable to lift her R arm. She has been advised that she needs surgery for her shoulder but she is not interested in surgical  intervention at this time. She has a history of chronic back pain and reports extensive spinal fusions. "I'm all fused except for my neck." She reports a remote cervical fusion as well but does not recall which level. She complains of chronic R hip pain which prevents her from laying on her R side. She has had bilateral TKR and states that this caused a leg length difference in her lower extremities. Currently she wears a lift in her R shoe. She has a lot of R knee instability and wears a brace on her R knee to prevent it from buckling. Pt has had two to three falls in the last 6 months and many "almost falls." She uses and single point cane in RUE when out in the community and uses a rollator at home. History of physical therapy after her bilateral TKR and her spinal fusions. She also had an evaluation for her gait/balance in [place] but due to COVID pandemic was unable to return for treatment. While living in [place] she was getting trigger point injections for her neck pain which were helpful however since moving to [facility] she has not had any injections. She has had spinal injections in the past as well with very brief relief. History of  R breast cancer s/p bilateral mastectomy. She has RUE lymphedema and pt has a home compression unit but she doesn't use it because she states that it doesn't help.    Limitations Walking;Standing;Lifting    Diagnostic tests See history    Patient Stated Goals Decrease neck pain, improve R knee stability/gait/balance                 TREATMENT   Ther-ex  Standing exercises with 3# ankle weights (AW): Hip flexion marches x 1 minute; Hip abduction x 1 minute; Hamstring curls x 1 minute; Hip extension x 1 minute;  Seated alternating LAQ with 3# AW 2 x 1 minute; Seated clams x 1 minute, with red tband; Seated adductor ball squeeze x 1 minute;  NuStep L1-3 x 8 minutes BLE at end of session with therapist monitoring fatigue and adjusting  resistance accordingly;   Neuromuscular Re-education  All balance exercises performed in parallel bars without upper extremity support unless otherwise indicated;  Alternating 6 inch step taps x10 BLE; Alternating forward/backward step over half foam roller x10 BLE;   Pt educated throughout session about proper posture and technique with exercises. Improved exercise technique, movement at target joints, use of target muscles after min to mod verbal, visual, tactile cues.     Repeated standing and seated exercise with patient today.  She denies any increase in her pain and demonstrates improved endurance with exercises.  She does still continue to get fatigued requiring regular rest breaks throughout.  Additional balance exercises included in session today which are challenging for patient.  Pt advised to continue HEP and follow-up as scheduled. She will benefit from PT services to address deficits in pain, strength, balance, and mobility in order to improve function at home and decreased risk for falls.           PT Short Term Goals - 08/30/20 1702       PT SHORT TERM GOAL #1   Title Pt will be independent with HEP in order to decrease neck pain and improve balance/gait to improve pain-free function at home and decrease risk for falls    Time 6    Period Weeks    Status New    Target Date 10/11/20               PT Long Term Goals - 09/04/20 1521       PT LONG TERM GOAL #1   Title Pt will demonstrate decrease in NDI by at least 19% in order to demonstrate clinically significant reduction in disability related to neck pain    Baseline 08/30/20: 58%    Time 12    Period Weeks    Status New    Target Date 11/22/20      PT LONG TERM GOAL #2   Title Pt will decrease worst neck pain as reported on NPRS by at least 2 points in order to demonstrate clinically significant reduction in back pain.    Baseline 08/30/20: worst 8/10    Time 8    Period Weeks    Status New     Target Date 11/22/20      PT LONG TERM GOAL #3   Title Pt will report at least 30% improvement in her neck symptoms in order to improve pain-free function at hom    Time 8    Period Weeks    Status New    Target Date 11/22/20      PT LONG TERM GOAL #4  Title Pt will increase self-selected by at least 0.13 m/s in order to demonstrate clinically significant improvement in community ambulation.    Baseline 09/04/20: self-selected: 26.8s = 0.37 m/s    Time 8    Period Weeks    Status New    Target Date 11/22/20      PT LONG TERM GOAL #5   Title Pt will improve BERG by at least 3 points in order to demonstrate clinically significant improvement in balance.    Baseline 09/04/20: 40/56    Time 8    Period Weeks    Status New    Target Date 11/22/20                   Plan - 09/27/20 1511     Clinical Impression Statement Repeated standing and seated exercise with patient today.  She denies any increase in her pain and demonstrates improved endurance with exercises.  She does still continue to get fatigued requiring regular rest breaks throughout.  Additional balance exercises included in session today which are challenging for patient.  Pt advised to continue HEP and follow-up as scheduled. She will benefit from PT services to address deficits in pain, strength, balance, and mobility in order to improve function at home and decreased risk for falls.?    Personal Factors and Comorbidities Age;Comorbidity 3+;Past/Current Experience;Time since onset of injury/illness/exacerbation    Comorbidities cervical fusion, lumbar fusion, HTN, chronic pain, TKR, R shoulder RTC tear s/p surgery    Examination-Activity Limitations Bend;Hygiene/Grooming;Dressing;Lift;Reach Overhead;Squat;Stairs;Stand    Examination-Participation Restrictions Cleaning;Community Activity;Laundry;Meal Prep;Shop    Stability/Clinical Decision Making Unstable/Unpredictable    Rehab Potential Fair    PT Frequency  2x / week    PT Duration 12 weeks    PT Treatment/Interventions ADLs/Self Care Home Management;Aquatic Therapy;Biofeedback;Canalith Repostioning;Cryotherapy;Electrical Stimulation;Iontophoresis 4mg /ml Dexamethasone;Moist Heat;Traction;Ultrasound;DME Instruction;Gait training;Stair training;Functional mobility training;Therapeutic activities;Therapeutic exercise;Balance training;Neuromuscular re-education;Patient/family education;Manual techniques;Passive range of motion;Dry needling;Vestibular;Spinal Manipulations;Joint Manipulations    PT Next Visit Plan Update outcome measures/goals; LE strengthening, balance, neck modalities/strengthening    PT Home Exercise Plan Access Code:    Consulted and Agree with Plan of Care Patient               Patient will benefit from skilled therapeutic intervention in order to improve the following deficits and impairments:  Abnormal gait, Decreased balance, Decreased strength, Pain  Visit Diagnosis: Muscle weakness (generalized)  Other abnormalities of gait and mobility     Problem List Patient Active Problem List   Diagnosis Date Noted   UTI (urinary tract infection) 08/03/2020   Chronic back pain    Hypertension    GERD (gastroesophageal reflux disease)    08/05/2020 Marilyn Keith PT, DPT, GCS  Yona Stansbury 09/27/2020, 3:16 PM  [facility] [address] [place], [place], [place] Phone: [phone]   Fax:  [phone]  Name: Marilyn Keith MRN: Rinaldo Cloud Date of Birth: [birth year]

## 2020-10-11 ENCOUNTER — Other Ambulatory Visit: Payer: Self-pay

## 2020-10-11 ENCOUNTER — Ambulatory Visit: Payer: Medicare Other

## 2020-10-11 DIAGNOSIS — R2689 Other abnormalities of gait and mobility: Secondary | ICD-10-CM

## 2020-10-11 DIAGNOSIS — M6281 Muscle weakness (generalized): Secondary | ICD-10-CM | POA: Diagnosis not present

## 2020-10-11 NOTE — Therapy (Signed)
[facility] [address] [place], [place], [address] Phone: [phone]   Fax:  [phone]  Physical Therapy Progress Note  Dates of reporting period  08/30/20  to   10/11/20  Patient Details  Name: Marilyn Keith MRN: [id number] Date of Birth: [birth year] Referring Provider (PT): Bubba Camp FNP   Encounter Date: 10/11/2020   PT End of Session - 10/11/20 1201     Visit Number 10    Number of Visits 25    Date for PT Re-Evaluation 11/22/20    Authorization Type eval: 08/30/20    PT Start Time 1145    PT Stop Time 1230    PT Time Calculation (min) 45 min    Activity Tolerance Patient limited by pain    Behavior During Therapy [facility] for tasks assessed/performed               Past Medical History:  Diagnosis Date   Chronic back pain    GERD (gastroesophageal reflux disease)    Hypertension     Past Surgical History:  Procedure Laterality Date   BACK SURGERY     EYE SURGERY     MASTECTOMY     REPLACEMENT TOTAL KNEE BILATERAL     SHOULDER SURGERY Right     There were no vitals filed for this visit.    Subjective Assessment - 10/11/20 1200     Subjective Patient reports she is doing alright today.  Patient was at the beach last week and states that she did a lot of walking.  Her daughter noted that she did not need as much assistance with walking and this was encouraging.  Patient does report 1 fall while she was on the beach onto the sand but denies any injury.  Chronic pain persist in multiple joints.  No specific questions or concerns.    Pertinent History Pt complains of neck and bilateral shoulder pain for the last 10 years. "If I don't take my Lyrica it hurts so bad that I cry." She also complains of constant headaches on the crown of her head. She takes a lot of Excedrin for the headaches. She also complains of bilateral shoulder pain which is independent from her neck. History of 3 rotator cuffs on her R  shoulder and is currently unable to lift her R arm. She has been advised that she needs surgery for her shoulder but she is not interested in surgical intervention at this time. She has a history of chronic back pain and reports extensive spinal fusions. "I'm all fused except for my neck." She reports a remote cervical fusion as well but does not recall which level. She complains of chronic R hip pain which prevents her from laying on her R side. She has had bilateral TKR and states that this caused a leg length difference in her lower extremities. Currently she wears a lift in her R shoe. She has a lot of R knee instability and wears a brace on her R knee to prevent it from buckling. Pt has had two to three falls in the last 6 months and many "almost falls." She uses and single point cane in RUE when out in the community and uses a rollator at home. History of physical therapy after her bilateral TKR and her spinal fusions. She also had an evaluation for her gait/balance in [place] but due to [place] pandemic was unable to return for treatment. While living in [place] she was getting trigger point  injections for her neck pain which were helpful however since moving to [facility] she has not had any injections. She has had spinal injections in the past as well with very brief relief. History of R breast cancer s/p bilateral mastectomy. She has RUE lymphedema and pt has a home compression unit but she doesn't use it because she states that it doesn't help.    Limitations Walking;Standing;Lifting    Diagnostic tests See history    Patient Stated Goals Decrease neck pain, improve R knee stability/gait/balance                 TREATMENT   Neuromuscular Re-education  NuStep L2 x 10 minutes BLE at start of session with therapist monitoring fatigue and obtaining interval history; Updated outcome measures and goals with patient: NDI: 64% FOTO: 47  Worst neck pain: 8/10 Self-selected 73mgait  speed: 10/11/20: 17.6s = 0.57 m/s BERG: 46/56   Pt educated throughout session about proper posture and technique with exercises. Improved exercise technique, movement at target joints, use of target muscles after min to mod verbal, visual, tactile cues.     Patient arrives with excellent motivation wrist pain therapy.  Updated outcome measures and goals with patient during session today.  Patient denies any change in neck pain since starting with therapy.  Therapy has not focused on neck pain as patient reports that any attempts to treat her neck have increased her pain.  Therapy has focused on lower extremity strengthening and balance.  Patient's self-selected gait speed and BERG have both improved significantly since starting with therapy.  She does still continue to get fatigued during therapy sessions requiring regular rest breaks throughout. Pt advised to continue HEP and follow-up as scheduled. She will benefit from PT services to address deficits in pain, strength, balance, and mobility in order to improve function at home and decreased risk for falls.           PT Short Term Goals - 10/11/20 1213       PT SHORT TERM GOAL #1   Title Pt will be independent with HEP in order to decrease neck pain and improve balance/gait to improve pain-free function at home and decrease risk for falls    Time 6    Period Weeks    Status On-going    Target Date 10/11/20               PT Long Term Goals - 10/11/20 1213       PT LONG TERM GOAL #1   Title Pt will demonstrate decrease in NDI by at least 19% in order to demonstrate clinically significant reduction in disability related to neck pain    Baseline 08/30/20: 58%; 10/11/20: 64%    Time 12    Period Weeks    Status Not Met      PT LONG TERM GOAL #2   Title Pt will decrease worst neck pain as reported on NPRS by at least 2 points in order to demonstrate clinically significant reduction in back pain.    Baseline 08/30/20: worst  8/10; 10/11/20: 8/10;    Time 8    Period Weeks    Status Not Met      PT LONG TERM GOAL #3   Title Pt will report at least 30% improvement in her neck symptoms in order to improve pain-free function at hom    Baseline 10/11/20: No change    Time 8    Period Weeks  Status Not Met      PT LONG TERM GOAL #4   Title Pt will increase self-selected 10MWT to at least 0.6 m/s in order to demonstrate clinically significant improvement in household and community ambulation    Baseline 09/04/20: self-selected: 26.8s = 0.37 m/s; 10/11/20: 17.6s = 0.57 m/s    Time 8    Period Weeks    Status Revised    Target Date 11/22/20      PT LONG TERM GOAL #5   Title Pt will improve BERG to at least 49/56 in order to demonstrate clinically significant improvement in balance and lower risk for falls.    Baseline 09/04/20: 40/56; 10/11/20: 46/56    Time 8    Period Weeks    Status Revised    Target Date 11/22/20                   Plan - 10/11/20 1203     Clinical Impression Statement Patient arrives with excellent motivation wrist pain therapy.  Updated outcome measures and goals with patient during session today.  Patient denies any change in neck pain since starting with therapy.  Therapy has not focused on neck pain as patient reports that any attempts to treat her neck have increased her pain.  Therapy has focused on lower extremity strengthening and balance.  Patient's self-selected gait speed and BERG have both improved significantly since starting with therapy.  She does still continue to get fatigued during therapy sessions requiring regular rest breaks throughout. Pt advised to continue HEP and follow-up as scheduled. She will benefit from PT services to address deficits in pain, strength, balance, and mobility in order to improve function at home and decreased risk for falls.?    Personal Factors and Comorbidities Age;Comorbidity 3+;Past/Current Experience;Time since onset of  injury/illness/exacerbation    Comorbidities cervical fusion, lumbar fusion, HTN, chronic pain, TKR, R shoulder RTC tear s/p surgery    Examination-Activity Limitations Bend;Hygiene/Grooming;Dressing;Lift;Reach Overhead;Squat;Stairs;Stand    Examination-Participation Restrictions Cleaning;Community Activity;Laundry;Meal Prep;Shop    Stability/Clinical Decision Making Unstable/Unpredictable    Rehab Potential Fair    PT Frequency 2x / week    PT Duration 12 weeks    PT Treatment/Interventions ADLs/Self Care Home Management;Aquatic Therapy;Biofeedback;Canalith Repostioning;Cryotherapy;Electrical Stimulation;Iontophoresis 70m/ml Dexamethasone;Moist Heat;Traction;Ultrasound;DME Instruction;Gait training;Stair training;Functional mobility training;Therapeutic activities;Therapeutic exercise;Balance training;Neuromuscular re-education;Patient/family education;Manual techniques;Passive range of motion;Dry needling;Vestibular;Spinal Manipulations;Joint Manipulations    PT Next Visit Plan Update outcome measures/goals; LE strengthening, balance, neck modalities/strengthening    PT Home Exercise Plan Access Code: [email or web address]   Consulted and Agree with Plan of Care Patient               Patient will benefit from skilled therapeutic intervention in order to improve the following deficits and impairments:  Abnormal gait, Decreased balance, Decreased strength, Pain  Visit Diagnosis: Muscle weakness (generalized)  Other abnormalities of gait and mobility     Problem List Patient Active Problem List   Diagnosis Date Noted   UTI (urinary tract infection) 08/03/2020   Chronic back pain    Hypertension    GERD (gastroesophageal reflux disease)    JLyndel SafeHuprich PT, DPT, GCS  Derico Mitton 10/11/2020, 4:06 PM  [facility] [place] [address]Phone: [phone]  Fax:  [phone] Name: HTakesha StegerMRN:  [id number] of Birth: [birth year]

## 2020-10-16 ENCOUNTER — Other Ambulatory Visit: Payer: Self-pay

## 2020-10-16 ENCOUNTER — Ambulatory Visit: Payer: Medicare Other | Attending: Family Medicine

## 2020-10-16 DIAGNOSIS — R2689 Other abnormalities of gait and mobility: Secondary | ICD-10-CM | POA: Diagnosis present

## 2020-10-16 DIAGNOSIS — M542 Cervicalgia: Secondary | ICD-10-CM | POA: Diagnosis present

## 2020-10-16 DIAGNOSIS — M6281 Muscle weakness (generalized): Secondary | ICD-10-CM | POA: Insufficient documentation

## 2020-10-16 NOTE — Therapy (Signed)
[facility] [address] [place], [place], [address] Phone: [phone]   Fax:  [phone]  Physical Therapy Treatment  Patient Details  Name: Marilyn Keith MRN: [id number] Date of Birth: January 24, 1945 Referring Provider (PT): Bubba Camp FNP   Encounter Date: 10/16/2020   PT End of Session - 10/16/20 1156     Visit Number 11    Number of Visits 25    Date for PT Re-Evaluation 11/22/20    Authorization Type Medicare (traditional); BCBS secondary    Authorization Time Period 08/30/20-11/22/20    Progress Note Due on Visit 20    PT Start Time 1145    PT Stop Time 1225    PT Time Calculation (min) 40 min    Activity Tolerance Patient limited by pain    Behavior During Therapy [facility] for tasks assessed/performed             Past Medical History:  Diagnosis Date   Chronic back pain    GERD (gastroesophageal reflux disease)    Hypertension     Past Surgical History:  Procedure Laterality Date   BACK SURGERY     EYE SURGERY     MASTECTOMY     REPLACEMENT TOTAL KNEE BILATERAL     SHOULDER SURGERY Right     There were no vitals filed for this visit.   Subjective Assessment - 10/16/20 1152     Subjective Pt reports her recent left foot pain flare has much improved. Her other chronic pain areas are typical. She is feeling good overall, has no other medical updates.    Pertinent History Pt complains of neck and bilateral shoulder pain for the last 10 years. "If I don't take my Lyrica it hurts so bad that I cry." She also complains of constant headaches on the crown of her head. She takes a lot of Excedrin for the headaches. She also complains of bilateral shoulder pain which is independent from her neck. History of 3 rotator cuffs on her R shoulder and is currently unable to lift her R arm. She has been advised that she needs surgery for her shoulder but she is not interested in surgical intervention at this time. She has a  history of chronic back pain and reports extensive spinal fusions. "I'm all fused except for my neck." She reports a remote cervical fusion as well but does not recall which level. She complains of chronic R hip pain which prevents her from laying on her R side. She has had bilateral TKR and states that this caused a leg length difference in her lower extremities. Currently she wears a lift in her R shoe. She has a lot of R knee instability and wears a brace on her R knee to prevent it from buckling. Pt has had two to three falls in the last 6 months and many "almost falls." She uses and single point cane in RUE when out in the community and uses a rollator at home. History of physical therapy after her bilateral TKR and her spinal fusions. She also had an evaluation for her gait/balance in [place] but due to [place] pandemic was unable to return for treatment. While living in [place] she was getting trigger point injections for her neck pain which were helpful however since moving to [facility] she has not had any injections. She has had spinal injections in the past as well with very brief relief. History of R breast cancer s/p bilateral mastectomy. She has  RUE lymphedema and pt has a home compression unit but she doesn't use it because she states that it doesn't help.    Currently in Pain? Yes    Pain Score 6    typical multiple pain sights, shoulders, back, neck, legs             Ther-ex  Standing exercises with 3# ankle weights (AW): Hip flexion marches x 1 minute; Hip abduction 1x10 bilat  Hamstring curls x 1 minute; Hip extension x 15 bilat   Seated alternating LAQ with 3# AW 1x15 bilat; Seated clams x 1 minute, with red tband; Seated adductor ball squeeze x 1 minute;  Seated alternating LAQ with 3# AW 1x15 bilat; Hip abduction 1x10 bilat  Hip extension 1x15 bilat  Neuromuscular Re-education  All balance exercises performed in parallel bars without upper extremity support  unless otherwise indicated;   Alternating 6 inch step taps x10 BLE; Seated recovery interval Alternating 6 inch step taps x10 BLE; Alternating forward/backward step over half foam roller x10 BLE;   NuStep L2-3 x 10 minutes BLE at end of session with therapist monitoring fatigue and adjusting resistance accordingly;      Pt educated throughout session about proper posture and technique with exercises. Improved exercise technique, movement at target joints, use of target muscles after min to mod verbal, visual, tactile cues.      Repeated standing and seated exercise with patient today.  She denies any increase in her pain and demonstrates improved endurance with exercises.  She does still continue to get fatigued requiring regular rest breaks throughout.  Additional balance exercises included in session today which are challenging for patient.  Pt advised to continue HEP and follow-up as scheduled. She will benefit from PT services to address deficits in pain, strength, balance, and mobility in order to improve function at home and decreased risk for falls.        PT Education - 10/16/20 1155     Education Details Conitnued form with exercise    Person(s) Educated Patient    Methods Explanation    Comprehension Verbalized understanding              PT Short Term Goals - 10/11/20 1213       PT SHORT TERM GOAL #1   Title Pt will be independent with HEP in order to decrease neck pain and improve balance/gait to improve pain-free function at home and decrease risk for falls    Time 6    Period Weeks    Status On-going    Target Date 10/11/20               PT Long Term Goals - 10/11/20 1213       PT LONG TERM GOAL #1   Title Pt will demonstrate decrease in NDI by at least 19% in order to demonstrate clinically significant reduction in disability related to neck pain    Baseline 08/30/20: 58%; 10/11/20: 64%    Time 12    Period Weeks    Status Not Met    Target Date  11/22/20      PT LONG TERM GOAL #2   Title Pt will decrease worst neck pain as reported on NPRS by at least 2 points in order to demonstrate clinically significant reduction in back pain.    Baseline 08/30/20: worst 8/10; 10/11/20: 8/10;    Time 8    Period Weeks    Status Not Met    Target Date 11/22/20  PT LONG TERM GOAL #3   Title Pt will report at least 30% improvement in her neck symptoms in order to improve pain-free function at hom    Baseline 10/11/20: No change    Time 8    Period Weeks    Status Not Met    Target Date 11/22/20      PT LONG TERM GOAL #4   Title Pt will increase self-selected 10MWT to at least 0.6 m/s in order to demonstrate clinically significant improvement in household and community ambulation    Baseline 09/04/20: self-selected: 26.8s = 0.37 m/s; 10/11/20: 17.6s = 0.57 m/s    Time 8    Period Weeks    Status Revised    Target Date 11/22/20      PT LONG TERM GOAL #5   Title Pt will improve BERG to at least 49/56 in order to demonstrate clinically significant improvement in balance and lower risk for falls.    Baseline 09/04/20: 40/56; 10/11/20: 46/56    Time 8    Period Weeks    Status Revised    Target Date 11/22/20                   Plan - 10/16/20 1159     Clinical Impression Statement Continued with current plan of care as laid out in evaluation and recent prior sessions. Pt remains motivated to advance progress toward goals. Rest breaks provided as needed, pt quick to ask when needed. Author maintains all interventions within appropriate level of intensity as not to purposefully exacerbate pain. Pt does require varying levels of assistance and cuing for completion of exercises for correct form. Pt continues to demonstrate progress toward goals AEB progression of some interventions this date either in volume or intensity. No updates to HEP this date.    Personal Factors and Comorbidities Age;Comorbidity 3+;Past/Current Experience;Time since  onset of injury/illness/exacerbation    Comorbidities cervical fusion, lumbar fusion, HTN, chronic pain, TKR, R shoulder RTC tear s/p surgery    Examination-Activity Limitations Bend;Hygiene/Grooming;Dressing;Lift;Reach Overhead;Squat;Stairs;Stand    Examination-Participation Restrictions Cleaning;Community Activity;Laundry;Meal Prep;Shop    Stability/Clinical Decision Making Unstable/Unpredictable    Clinical Decision Making High    Rehab Potential Fair    PT Frequency 2x / week    PT Duration 12 weeks    PT Treatment/Interventions ADLs/Self Care Home Management;Aquatic Therapy;Biofeedback;Canalith Repostioning;Cryotherapy;Electrical Stimulation;Iontophoresis 61m/ml Dexamethasone;Moist Heat;Traction;Ultrasound;DME Instruction;Gait training;Stair training;Functional mobility training;Therapeutic activities;Therapeutic exercise;Balance training;Neuromuscular re-education;Patient/family education;Manual techniques;Passive range of motion;Dry needling;Vestibular;Spinal Manipulations;Joint Manipulations    PT Next Visit Plan Update outcome measures/goals; LE strengthening, balance, neck modalities/strengthening    PT Home Exercise Plan Access Code: [email or web address]   Consulted and Agree with Plan of Care Patient             Patient will benefit from skilled therapeutic intervention in order to improve the following deficits and impairments:  Abnormal gait, Decreased balance, Decreased strength, Pain  Visit Diagnosis: Muscle weakness (generalized)  Other abnormalities of gait and mobility  Cervicalgia     Problem List Patient Active Problem List   Diagnosis Date Noted   UTI (urinary tract infection) 08/03/2020   Chronic back pain    Hypertension    GERD (gastroesophageal reflux disease)    12:30 PM, 10/16/20 AEtta Keith PT, DPT Physical Therapist - [place] [place]     BHal[place] Cove[place], PT 10/16/2020, 12:03 PM  [facility] [address] [place]  [address] Phone: [phone]   Fax:  [phone]  Name: Marilyn Keith MRN: [id number] Date of Birth: [birth year]

## 2020-10-23 ENCOUNTER — Ambulatory Visit: Payer: Medicare Other

## 2020-10-23 ENCOUNTER — Other Ambulatory Visit: Payer: Self-pay

## 2020-10-23 DIAGNOSIS — R2689 Other abnormalities of gait and mobility: Secondary | ICD-10-CM

## 2020-10-23 DIAGNOSIS — M6281 Muscle weakness (generalized): Secondary | ICD-10-CM | POA: Diagnosis not present

## 2020-10-23 NOTE — Therapy (Signed)
[facility] [address] [place], [place], [address] Phone: [phone]   Fax:  [phone]  Physical Therapy Treatment  Patient Details  Name: Marilyn Keith MRN: [id number] Date of Birth: [birth year] Referring Provider (PT): Bubba Camp FNP   Encounter Date: 10/23/2020   PT End of Session - 10/23/20 1158     Visit Number 12    Number of Visits 25    Date for PT Re-Evaluation 11/22/20    Authorization Type Medicare (traditional); BCBS secondary    Authorization Time Period 08/30/20-11/22/20    PT Start Time 1153    PT Stop Time 1220    PT Time Calculation (min) 27 min    Activity Tolerance Patient limited by pain    Behavior During Therapy [facility] for tasks assessed/performed                Past Medical History:  Diagnosis Date   Chronic back pain    GERD (gastroesophageal reflux disease)    Hypertension     Past Surgical History:  Procedure Laterality Date   BACK SURGERY     EYE SURGERY     MASTECTOMY     REPLACEMENT TOTAL KNEE BILATERAL     SHOULDER SURGERY Right     There were no vitals filed for this visit.    Subjective Assessment - 10/23/20 1155     Subjective Pt reports that she is doing alright today but is very fatigued. She continues with the same chronic neck, shoulder, and knee pain as always. No specific questions currently.    Pertinent History Pt complains of neck and bilateral shoulder pain for the last 10 years. "If I don't take my Lyrica it hurts so bad that I cry." She also complains of constant headaches on the crown of her head. She takes a lot of Excedrin for the headaches. She also complains of bilateral shoulder pain which is independent from her neck. History of 3 rotator cuffs on her R shoulder and is currently unable to lift her R arm. She has been advised that she needs surgery for her shoulder but she is not interested in surgical intervention at this time. She has a history of  chronic back pain and reports extensive spinal fusions. "I'm all fused except for my neck." She reports a remote cervical fusion as well but does not recall which level. She complains of chronic R hip pain which prevents her from laying on her R side. She has had bilateral TKR and states that this caused a leg length difference in her lower extremities. Currently she wears a lift in her R shoe. She has a lot of R knee instability and wears a brace on her R knee to prevent it from buckling. Pt has had two to three falls in the last 6 months and many "almost falls." She uses and single point cane in RUE when out in the community and uses a rollator at home. History of physical therapy after her bilateral TKR and her spinal fusions. She also had an evaluation for her gait/balance in [place] but due to [place] pandemic was unable to return for treatment. While living in [place] she was getting trigger point injections for her neck pain which were helpful however since moving to [facility] she has not had any injections. She has had spinal injections in the past as well with very brief relief. History of R breast cancer s/p bilateral mastectomy. She has RUE lymphedema and pt  has a home compression unit but she doesn't use it because she states that it doesn't help.                  TREATMENT   Ther-ex  NuStep L2-3 x 10 minutes BLE only during interval history (5 minutes unbilled);  Standing exercises with 3# ankle weights (AW): Hip flexion marches x 1 minute; Hip abduction x 1 minute; Hamstring curls x 1 minute; Hip extension x 1 minute;  Seated alternating LAQ with 3# AW 2 x 1 minute; Seated clams x 1 minute, with red tband; Seated adductor ball squeeze x 1 minute;  Alternating 6 inch step taps x10 BLE; 6" forward step-ups with BUE support x 10 leading with each LE;   Pt educated throughout session about proper posture and technique with exercises. Improved exercise technique,  movement at target joints, use of target muscles after min to mod verbal, visual, tactile cues.     Patient reports fatigue today upon arrival and would like to keep session abbreviated. Repeated standing and seated exercise with patient today.  She denies any increase in her pain with exercises.  Did not progress resistance of ankle weights today due to reported fatigue.  Regular rest breaks provided throughout session. Pt advised to continue HEP and follow-up as scheduled. She will benefit from PT services to address deficits in pain, strength, balance, and mobility in order to improve function at home and decreased risk for falls.           PT Short Term Goals - 10/11/20 1213       PT SHORT TERM GOAL #1   Title Pt will be independent with HEP in order to decrease neck pain and improve balance/gait to improve pain-free function at home and decrease risk for falls    Time 6    Period Weeks    Status On-going    Target Date 10/11/20               PT Long Term Goals - 10/11/20 1213       PT LONG TERM GOAL #1   Title Pt will demonstrate decrease in NDI by at least 19% in order to demonstrate clinically significant reduction in disability related to neck pain    Baseline 08/30/20: 58%; 10/11/20: 64%    Time 12    Period Weeks    Status Not Met    Target Date 11/22/20      PT LONG TERM GOAL #2   Title Pt will decrease worst neck pain as reported on NPRS by at least 2 points in order to demonstrate clinically significant reduction in back pain.    Baseline 08/30/20: worst 8/10; 10/11/20: 8/10;    Time 8    Period Weeks    Status Not Met    Target Date 11/22/20      PT LONG TERM GOAL #3   Title Pt will report at least 30% improvement in her neck symptoms in order to improve pain-free function at hom    Baseline 10/11/20: No change    Time 8    Period Weeks    Status Not Met    Target Date 11/22/20      PT LONG TERM GOAL #4   Title Pt will increase self-selected 10MWT to  at least 0.6 m/s in order to demonstrate clinically significant improvement in household and community ambulation    Baseline 09/04/20: self-selected: 26.8s = 0.37 m/s; 10/11/20: 17.6s = 0.57 m/s  Time 8    Period Weeks    Status Revised    Target Date 11/22/20      PT LONG TERM GOAL #5   Title Pt will improve BERG to at least 49/56 in order to demonstrate clinically significant improvement in balance and lower risk for falls.    Baseline 09/04/20: 40/56; 10/11/20: 46/56    Time 8    Period Weeks    Status Revised    Target Date 11/22/20                   Plan - 10/23/20 1159     Clinical Impression Statement Patient reports fatigue today upon arrival and would like to keep session abbreviated. Repeated standing and seated exercise with patient today.  She denies any increase in her pain with exercises.  Did not progress resistance of ankle weights today due to reported fatigue.  Regular rest breaks provided throughout session. Pt advised to continue HEP and follow-up as scheduled. She will benefit from PT services to address deficits in pain, strength, balance, and mobility in order to improve function at home and decreased risk for falls.    Personal Factors and Comorbidities Age;Comorbidity 3+;Past/Current Experience;Time since onset of injury/illness/exacerbation    Comorbidities cervical fusion, lumbar fusion, HTN, chronic pain, TKR, R shoulder RTC tear s/p surgery    Examination-Activity Limitations Bend;Hygiene/Grooming;Dressing;Lift;Reach Overhead;Squat;Stairs;Stand    Examination-Participation Restrictions Cleaning;Community Activity;Laundry;Meal Prep;Shop    Stability/Clinical Decision Making Unstable/Unpredictable    Rehab Potential Fair    PT Frequency 2x / week    PT Duration 12 weeks    PT Treatment/Interventions ADLs/Self Care Home Management;Aquatic Therapy;Biofeedback;Canalith Repostioning;Cryotherapy;Electrical Stimulation;Iontophoresis 63m/ml Dexamethasone;Moist  Heat;Traction;Ultrasound;DME Instruction;Gait training;Stair training;Functional mobility training;Therapeutic activities;Therapeutic exercise;Balance training;Neuromuscular re-education;Patient/family education;Manual techniques;Passive range of motion;Dry needling;Vestibular;Spinal Manipulations;Joint Manipulations    PT Next Visit Plan Update outcome measures/goals; LE strengthening, balance, neck modalities/strengthening    PT Home Exercise Plan Access Code: [email or web address]   Consulted and Agree with Plan of Care Patient                Patient will benefit from skilled therapeutic intervention in order to improve the following deficits and impairments:  Abnormal gait, Decreased balance, Decreased strength, Pain  Visit Diagnosis: Muscle weakness (generalized)  Other abnormalities of gait and mobility     Problem List Patient Active Problem List   Diagnosis Date Noted   UTI (urinary tract infection) 08/03/2020   Chronic back pain    Hypertension    GERD (gastroesophageal reflux disease)    JLyndel SafeHuprich PT, DPT, GCS  Ahmad Vanwey 10/23/2020, 1:14 PM  [facility] [facility] [facility] [place] [address]Phone: [phone]  Fax:  [phone] Name: Marilyn NeuMRN: [id number] of Birth: [birth year]

## 2020-10-25 ENCOUNTER — Other Ambulatory Visit: Payer: Self-pay

## 2020-10-25 ENCOUNTER — Ambulatory Visit: Payer: Medicare Other

## 2020-10-25 DIAGNOSIS — R2689 Other abnormalities of gait and mobility: Secondary | ICD-10-CM

## 2020-10-25 DIAGNOSIS — M6281 Muscle weakness (generalized): Secondary | ICD-10-CM | POA: Diagnosis not present

## 2020-10-25 NOTE — Therapy (Signed)
[facility] [address] [place], [place], [address] Phone: [phone]   Fax:  [phone]  Physical Therapy Treatment  Patient Details  Name: Marilyn Keith MRN: [id number] Date of Birth: 14-Mar-1945 Referring Provider (PT): Bubba Camp FNP   Encounter Date: 10/25/2020   PT End of Session - 10/25/20 1212     Visit Number 13    Number of Visits 25    Date for PT Re-Evaluation 11/22/20    Authorization Type Medicare (traditional); BCBS secondary    Authorization Time Period 08/30/20-11/22/20    PT Start Time 1145    PT Stop Time 1230    PT Time Calculation (min) 45 min    Activity Tolerance Patient limited by pain    Behavior During Therapy [facility] for tasks assessed/performed              Past Medical History:  Diagnosis Date   Chronic back pain    GERD (gastroesophageal reflux disease)    Hypertension     Past Surgical History:  Procedure Laterality Date   BACK SURGERY     EYE SURGERY     MASTECTOMY     REPLACEMENT TOTAL KNEE BILATERAL     SHOULDER SURGERY Right     There were no vitals filed for this visit.    Subjective Assessment - 10/25/20 1156     Subjective Pt reports that she is doing alright today. Her chronic pain is slightly better today. She was able to get more rest last night and she believes that might have helped her pain today.  No specific questions currently.    Pertinent History Pt complains of neck and bilateral shoulder pain for the last 10 years. "If I don't take my Lyrica it hurts so bad that I cry." She also complains of constant headaches on the crown of her head. She takes a lot of Excedrin for the headaches. She also complains of bilateral shoulder pain which is independent from her neck. History of 3 rotator cuffs on her R shoulder and is currently unable to lift her R arm. She has been advised that she needs surgery for her shoulder but she is not interested in surgical  intervention at this time. She has a history of chronic back pain and reports extensive spinal fusions. "I'm all fused except for my neck." She reports a remote cervical fusion as well but does not recall which level. She complains of chronic R hip pain which prevents her from laying on her R side. She has had bilateral TKR and states that this caused a leg length difference in her lower extremities. Currently she wears a lift in her R shoe. She has a lot of R knee instability and wears a brace on her R knee to prevent it from buckling. Pt has had two to three falls in the last 6 months and many "almost falls." She uses and single point cane in RUE when out in the community and uses a rollator at home. History of physical therapy after her bilateral TKR and her spinal fusions. She also had an evaluation for her gait/balance in [place] but due to [place] pandemic was unable to return for treatment. While living in [place] she was getting trigger point injections for her neck pain which were helpful however since moving to [facility] she has not had any injections. She has had spinal injections in the past as well with very brief relief. History of R breast cancer s/p  bilateral mastectomy. She has RUE lymphedema and pt has a home compression unit but she doesn't use it because she states that it doesn't help.               TREATMENT   Ther-ex  NuStep L2-3 x 10 minutes BLE only during interval history (5 minutes unbilled);  Standing exercises with 3# ankle weights (AW): Hip flexion marches x 1 minute; Hip abduction x 1 minute; Hamstring curls x 1 minute; Hip extension x 1 minute;  Seated alternating LAQ with 3# AW 2 x 1 minute; Seated clams x 1 minute, with red tband; Seated adductor ball squeeze x 1 minute;  Side stepping in // bars with 3# AW x 2 lengths, discontinued secondary to increase in pain; Seated bicep curls with 2# dumbbells x 10; Seated chest press with cane and  resistance from therapist x 10; Seated rows with cane and resistance from therapist x 10;   Pt educated throughout session about proper posture and technique with exercises. Improved exercise technique, movement at target joints, use of target muscles after min to mod verbal, visual, tactile cues.     Repeated standing and seated exercises with patient today.  She denies any increase in her pain with exercises. She reports that overall she feels stronger since starting therapy. Added additional UE strengthening exercises due to complaints of UE weakness and need for improved function related to arm strength. Regular rest breaks provided throughout session. Pt advised to continue HEP and follow-up as scheduled. She will benefit from PT services to address deficits in pain, strength, balance, and mobility in order to improve function at home and decreased risk for falls.           PT Short Term Goals - 10/11/20 1213       PT SHORT TERM GOAL #1   Title Pt will be independent with HEP in order to decrease neck pain and improve balance/gait to improve pain-free function at home and decrease risk for falls    Time 6    Period Weeks    Status On-going    Target Date 10/11/20               PT Long Term Goals - 10/11/20 1213       PT LONG TERM GOAL #1   Title Pt will demonstrate decrease in NDI by at least 19% in order to demonstrate clinically significant reduction in disability related to neck pain    Baseline 08/30/20: 58%; 10/11/20: 64%    Time 12    Period Weeks    Status Not Met    Target Date 11/22/20      PT LONG TERM GOAL #2   Title Pt will decrease worst neck pain as reported on NPRS by at least 2 points in order to demonstrate clinically significant reduction in back pain.    Baseline 08/30/20: worst 8/10; 10/11/20: 8/10;    Time 8    Period Weeks    Status Not Met    Target Date 11/22/20      PT LONG TERM GOAL #3   Title Pt will report at least 30% improvement in  her neck symptoms in order to improve pain-free function at hom    Baseline 10/11/20: No change    Time 8    Period Weeks    Status Not Met    Target Date 11/22/20      PT LONG TERM GOAL #4   Title Pt will increase self-selected  10MWT to at least 0.6 m/s in order to demonstrate clinically significant improvement in household and community ambulation    Baseline 09/04/20: self-selected: 26.8s = 0.37 m/s; 10/11/20: 17.6s = 0.57 m/s    Time 8    Period Weeks    Status Revised    Target Date 11/22/20      PT LONG TERM GOAL #5   Title Pt will improve BERG to at least 49/56 in order to demonstrate clinically significant improvement in balance and lower risk for falls.    Baseline 09/04/20: 40/56; 10/11/20: 46/56    Time 8    Period Weeks    Status Revised    Target Date 11/22/20                   Plan - 10/25/20 1213     Clinical Impression Statement Repeated standing and seated exercises with patient today.  She denies any increase in her pain with exercises. She reports that overall she feels stronger since starting therapy. Added additional UE strengthening exercises due to complaints of UE weakness and need for improved function related to arm strength. Regular rest breaks provided throughout session. Pt advised to continue HEP and follow-up as scheduled. She will benefit from PT services to address deficits in pain, strength, balance, and mobility in order to improve function at home and decreased risk for falls.?    Personal Factors and Comorbidities Age;Comorbidity 3+;Past/Current Experience;Time since onset of injury/illness/exacerbation    Comorbidities cervical fusion, lumbar fusion, HTN, chronic pain, TKR, R shoulder RTC tear s/p surgery    Examination-Activity Limitations Bend;Hygiene/Grooming;Dressing;Lift;Reach Overhead;Squat;Stairs;Stand    Examination-Participation Restrictions Cleaning;Community Activity;Laundry;Meal Prep;Shop    Stability/Clinical Decision Making  Unstable/Unpredictable    Rehab Potential Fair    PT Frequency 2x / week    PT Duration 12 weeks    PT Treatment/Interventions ADLs/Self Care Home Management;Aquatic Therapy;Biofeedback;Canalith Repostioning;Cryotherapy;Electrical Stimulation;Iontophoresis 18m/ml Dexamethasone;Moist Heat;Traction;Ultrasound;DME Instruction;Gait training;Stair training;Functional mobility training;Therapeutic activities;Therapeutic exercise;Balance training;Neuromuscular re-education;Patient/family education;Manual techniques;Passive range of motion;Dry needling;Vestibular;Spinal Manipulations;Joint Manipulations    PT Next Visit Plan LE strengthening, balance, neck modalities/strengthening    PT Home Exercise Plan Access Code: [email or web address]   Consulted and Agree with Plan of Care Patient                 Patient will benefit from skilled therapeutic intervention in order to improve the following deficits and impairments:  Abnormal gait, Decreased balance, Decreased strength, Pain  Visit Diagnosis: Muscle weakness (generalized)  Other abnormalities of gait and mobility     Problem List Patient Active Problem List   Diagnosis Date Noted   UTI (urinary tract infection) 08/03/2020   Chronic back pain    Hypertension    GERD (gastroesophageal reflux disease)    JLyndel SafeHuprich PT, DPT, GCS  Jarmel Linhardt 10/25/2020, 4:07 PM  [facility] [facility] [place] [address]Phone: [phone]  Fax:  [phone] Name: HSummer MccolganMRN: [id number] of Birth: 808-Mar-1947

## 2020-11-06 ENCOUNTER — Other Ambulatory Visit: Payer: Self-pay

## 2020-11-06 ENCOUNTER — Ambulatory Visit: Payer: Medicare Other

## 2020-11-06 DIAGNOSIS — M6281 Muscle weakness (generalized): Secondary | ICD-10-CM | POA: Diagnosis not present

## 2020-11-06 DIAGNOSIS — M542 Cervicalgia: Secondary | ICD-10-CM

## 2020-11-06 DIAGNOSIS — R2689 Other abnormalities of gait and mobility: Secondary | ICD-10-CM

## 2020-11-06 NOTE — Therapy (Signed)
[facility] [address] [place], [place], [address] Phone: [phone]   Fax:  [phone]  Physical Therapy Treatment  Patient Details  Name: Seva Chancy MRN: [id number] Date of Birth: 06/10/1945 Referring Provider (PT): Bubba Camp FNP   Encounter Date: 11/06/2020   PT End of Session - 11/06/20 1153     Visit Number 14    Number of Visits 25    Date for PT Re-Evaluation 11/22/20    Authorization Type Medicare (traditional); BCBS secondary    Authorization Time Period 08/30/20-11/22/20    Progress Note Due on Visit 20    PT Start Time 1145    PT Stop Time 1225    PT Time Calculation (min) 40 min    Activity Tolerance Patient limited by pain;Patient tolerated treatment well;Patient limited by fatigue    Behavior During Therapy [facility] for tasks assessed/performed             Past Medical History:  Diagnosis Date   Chronic back pain    GERD (gastroesophageal reflux disease)    Hypertension     Past Surgical History:  Procedure Laterality Date   BACK SURGERY     EYE SURGERY     MASTECTOMY     REPLACEMENT TOTAL KNEE BILATERAL     SHOULDER SURGERY Right     There were no vitals filed for this visit.   Subjective Assessment - 11/06/20 1151     Subjective Pt doing ok today, shoulders and neck are around 3/10, but today her CPAP related headache seems to be around 25/10. Dr has increased her depression medication dose, but she has not started the new dosage as of this visit.    Pertinent History Pt complains of neck and bilateral shoulder pain for the last 10 years. "If I don't take my Lyrica it hurts so bad that I cry." She also complains of constant headaches on the crown of her head. She takes a lot of Excedrin for the headaches. She also complains of bilateral shoulder pain which is independent from her neck. History of 3 rotator cuffs on her R shoulder and is currently unable to lift her R arm. She has been  advised that she needs surgery for her shoulder but she is not interested in surgical intervention at this time. She has a history of chronic back pain and reports extensive spinal fusions. "I'm all fused except for my neck." She reports a remote cervical fusion as well but does not recall which level. She complains of chronic R hip pain which prevents her from laying on her R side. She has had bilateral TKR and states that this caused a leg length difference in her lower extremities. Currently she wears a lift in her R shoe. She has a lot of R knee instability and wears a brace on her R knee to prevent it from buckling. Pt has had two to three falls in the last 6 months and many "almost falls." She uses and single point cane in RUE when out in the community and uses a rollator at home. History of physical therapy after her bilateral TKR and her spinal fusions. She also had an evaluation for her gait/balance in [place] but due to [place] pandemic was unable to return for treatment. While living in [place] she was getting trigger point injections for her neck pain which were helpful however since moving to [facility] she has not had any injections. She has had spinal injections in  the past as well with very brief relief. History of R breast cancer s/p bilateral mastectomy. She has RUE lymphedema and pt has a home compression unit but she doesn't use it because she states that it doesn't help.    Patient Stated Goals Decrease neck pain, improve R knee stability/gait/balance    Currently in Pain? Yes   *see above           TREATMENT   Ther-ex  AA/ROM BLE on recumbent stepper, L2-3 x 10 minutes; improved RLE pain toward end Seat 7, legs only   Standing exercises with 3# ankle weights (AW): Hip flexion marches x10 Hip abduction x10 Hamstring curls x10 Hip extension x10  Hip flexion marches x10 Hip abduction x10 Hamstring curls x10 Hip extension x10    Seated alternating LAQ with 3# AW 2 x  1 minute; Seated clams x 1 minute, with red tband; Seated adductor ball squeeze x 15x3secH   Side stepping in // bars with 3# AW x 3 round-trips   Pt educated throughout session about proper posture and technique with exercises. Improved exercise technique, movement at target joints, use of target muscles after min to mod verbal, visual, tactile cues.         PT Education - 11/06/20 1152     Education Details Education on form with activity in session    Person(s) Educated Patient    Methods Explanation    Comprehension Verbalized understanding              PT Short Term Goals - 10/11/20 1213       PT SHORT TERM GOAL #1   Title Pt will be independent with HEP in order to decrease neck pain and improve balance/gait to improve pain-free function at home and decrease risk for falls    Time 6    Period Weeks    Status On-going    Target Date 10/11/20               PT Long Term Goals - 10/11/20 1213       PT LONG TERM GOAL #1   Title Pt will demonstrate decrease in NDI by at least 19% in order to demonstrate clinically significant reduction in disability related to neck pain    Baseline 08/30/20: 58%; 10/11/20: 64%    Time 12    Period Weeks    Status Not Met    Target Date 11/22/20      PT LONG TERM GOAL #2   Title Pt will decrease worst neck pain as reported on NPRS by at least 2 points in order to demonstrate clinically significant reduction in back pain.    Baseline 08/30/20: worst 8/10; 10/11/20: 8/10;    Time 8    Period Weeks    Status Not Met    Target Date 11/22/20      PT LONG TERM GOAL #3   Title Pt will report at least 30% improvement in her neck symptoms in order to improve pain-free function at hom    Baseline 10/11/20: No change    Time 8    Period Weeks    Status Not Met    Target Date 11/22/20      PT LONG TERM GOAL #4   Title Pt will increase self-selected 10MWT to at least 0.6 m/s in order to demonstrate clinically significant  improvement in household and community ambulation    Baseline 09/04/20: self-selected: 26.8s = 0.37 m/s; 10/11/20: 17.6s = 0.57 m/s  Time 8    Period Weeks    Status Revised    Target Date 11/22/20      PT LONG TERM GOAL #5   Title Pt will improve BERG to at least 49/56 in order to demonstrate clinically significant improvement in balance and lower risk for falls.    Baseline 09/04/20: 40/56; 10/11/20: 46/56    Time 8    Period Weeks    Status Revised    Target Date 11/22/20                   Plan - 11/06/20 1153     Clinical Impression Statement Continued with current plan of care as laid out in evaluation and recent prior sessions. All interventions tolerated as expected by author. Mobility and strength continue to progress as anticipated. Recovery intervals given as needed based on signs of exertion and/or pt request. Pt educated on best technique for each intervention- author uses verbal, visual, tactile cues to optimize learning. Author takes steps to maximize patient independence when appropriate. Pt remains highly motivated. Pt closely monitored throughout session for safe activity response, as well as to maximize patient safety during interventions. The patient's therapy prognosis indicates continued potential for improvement, anticipate that future progress is attainable in a reasonable/predictable timeframe. Maximum improvement is within reach. Pt will continue to benefit from skilled PT services to address deficits and impairment identified in evaluation in order to maximize independence and safety in basic mobility required for performance of ADL, IADL, and leisure.    Personal Factors and Comorbidities Age;Comorbidity 3+;Past/Current Experience;Time since onset of injury/illness/exacerbation    Comorbidities cervical fusion, lumbar fusion, HTN, chronic pain, TKR, R shoulder RTC tear s/p surgery    Examination-Activity Limitations Bend;Hygiene/Grooming;Dressing;Lift;Reach  Overhead;Squat;Stairs;Stand    Examination-Participation Restrictions Cleaning;Community Activity;Laundry;Meal Prep;Shop    Stability/Clinical Decision Making Unstable/Unpredictable    Clinical Decision Making High    Rehab Potential Fair    PT Frequency 2x / week    PT Duration 12 weeks    PT Treatment/Interventions ADLs/Self Care Home Management;Aquatic Therapy;Biofeedback;Canalith Repostioning;Cryotherapy;Electrical Stimulation;Iontophoresis 75m/ml Dexamethasone;Moist Heat;Traction;Ultrasound;DME Instruction;Gait training;Stair training;Functional mobility training;Therapeutic activities;Therapeutic exercise;Balance training;Neuromuscular re-education;Patient/family education;Manual techniques;Passive range of motion;Dry needling;Vestibular;Spinal Manipulations;Joint Manipulations    PT Next Visit Plan LE strengthening, balance, neck modalities/strengthening    PT Home Exercise Plan Access Code: [email or web address]   Consulted and Agree with Plan of Care Patient             Patient will benefit from skilled therapeutic intervention in order to improve the following deficits and impairments:  Abnormal gait, Decreased balance, Decreased strength, Pain  Visit Diagnosis: Muscle weakness (generalized)  Other abnormalities of gait and mobility  Cervicalgia     Problem List Patient Active Problem List   Diagnosis Date Noted   UTI (urinary tract infection) 08/03/2020   Chronic back pain    Hypertension    GERD (gastroesophageal reflux disease)    12:14 PM, 11/06/20 AEtta Grandchild PT, DPT Physical Therapist - [facility] [place])      BSt. MichaelC, PT 11/06/2020, 11:55 AM  [facility]MP[facility] [place] [address]Phone: [phone]  Fax:  [phone] Name: HZivah MayrMRN: [id number] of Birth: [birth year]

## 2020-11-08 ENCOUNTER — Other Ambulatory Visit: Payer: Self-pay

## 2020-11-08 ENCOUNTER — Ambulatory Visit: Payer: Medicare Other

## 2020-11-08 DIAGNOSIS — M6281 Muscle weakness (generalized): Secondary | ICD-10-CM | POA: Diagnosis not present

## 2020-11-08 DIAGNOSIS — R2689 Other abnormalities of gait and mobility: Secondary | ICD-10-CM

## 2020-11-08 NOTE — Therapy (Signed)
[place] [facility] [address] [place], [place], [address] Phone: [phone]   Fax:  [phone]  Physical Therapy Treatment  Patient Details  Name: Marilyn Keith MRN: [id number] Date of Birth: [birth year] Referring Provider (PT): Bubba Camp FNP   Encounter Date: 11/08/2020   PT End of Session - 11/08/20 1157     Visit Number 15    Number of Visits 25    Date for PT Re-Evaluation 11/22/20    Authorization Type Medicare (traditional); BCBS secondary    Authorization Time Period 08/30/20-11/22/20    PT Start Time 1145    PT Stop Time 1230    PT Time Calculation (min) 45 min    Activity Tolerance Patient limited by pain;Patient tolerated treatment well;Patient limited by fatigue    Behavior During Therapy [facility] for tasks assessed/performed               Past Medical History:  Diagnosis Date   Chronic back pain    GERD (gastroesophageal reflux disease)    Hypertension     Past Surgical History:  Procedure Laterality Date   BACK SURGERY     EYE SURGERY     MASTECTOMY     REPLACEMENT TOTAL KNEE BILATERAL     SHOULDER SURGERY Right     There were no vitals filed for this visit.    Subjective Assessment - 11/08/20 1154     Subjective Pt doing alright today. She continues with her chronic bilateral neck, shoulder, and knee pain. She had a lot of neck pain last week after her colonscopy due to the positioning of her neck during the procedure.    Pertinent History Pt complains of neck and bilateral shoulder pain for the last 10 years. "If I don't take my Lyrica it hurts so bad that I cry." She also complains of constant headaches on the crown of her head. She takes a lot of Excedrin for the headaches. She also complains of bilateral shoulder pain which is independent from her neck. History of 3 rotator cuffs on her R shoulder and is currently unable to lift her R arm. She has been advised that she needs surgery for her  shoulder but she is not interested in surgical intervention at this time. She has a history of chronic back pain and reports extensive spinal fusions. "I'm all fused except for my neck." She reports a remote cervical fusion as well but does not recall which level. She complains of chronic R hip pain which prevents her from laying on her R side. She has had bilateral TKR and states that this caused a leg length difference in her lower extremities. Currently she wears a lift in her R shoe. She has a lot of R knee instability and wears a brace on her R knee to prevent it from buckling. Pt has had two to three falls in the last 6 months and many "almost falls." She uses and single point cane in RUE when out in the community and uses a rollator at home. History of physical therapy after her bilateral TKR and her spinal fusions. She also had an evaluation for her gait/balance in [place] but due to [place] pandemic was unable to return for treatment. While living in [place] she was getting trigger point injections for her neck pain which were helpful however since moving to [facility] she has not had any injections. She has had spinal injections in the past as well with very brief relief. History  of R breast cancer s/p bilateral mastectomy. She has RUE lymphedema and pt has a home compression unit but she doesn't use it because she states that it doesn't help.    Patient Stated Goals Decrease neck pain, improve R knee stability/gait/balance                TREATMENT   Ther-ex  NuStep L2-3 x 10 minutes BLE only during interval history (5 minutes unbilled);  Standing exercises with 4# ankle weights (AW): Hip flexion marches x 1 minute; Hip abduction x 1 minute; Hamstring curls x 1 minute; Hip extension x 1 minute;  Seated alternating LAQ with 4# AW 2 x 1 minute; Seated clams 2 x 1 minute, with green tband; Seated adductor ball squeeze 2 x 1 minute;  Standing mini squats x 10; Seated bicep  curls with 3# dumbbells 2 x 10; Seated chest press with cane and resistance from therapist 2 x 10; Seated rows with cane and resistance from therapist 2 x 10;   Pt educated throughout session about proper posture and technique with exercises. Improved exercise technique, movement at target joints, use of target muscles after min to mod verbal, visual, tactile cues.     Repeated standing and seated exercises with patient today.  She denies any increase in her pain with exercises. She continues to report improving strength with physical therapy.  Continued additional UE strengthening exercises due to UE weakness. Regular rest breaks provided throughout session. Pt advised to continue HEP and follow-up as scheduled. She will benefit from PT services to address deficits in pain, strength, balance, and mobility in order to improve function at home and decreased risk for falls.           PT Short Term Goals - 10/11/20 1213       PT SHORT TERM GOAL #1   Title Pt will be independent with HEP in order to decrease neck pain and improve balance/gait to improve pain-free function at home and decrease risk for falls    Time 6    Period Weeks    Status On-going    Target Date 10/11/20               PT Long Term Goals - 10/11/20 1213       PT LONG TERM GOAL #1   Title Pt will demonstrate decrease in NDI by at least 19% in order to demonstrate clinically significant reduction in disability related to neck pain    Baseline 08/30/20: 58%; 10/11/20: 64%    Time 12    Period Weeks    Status Not Met    Target Date 11/22/20      PT LONG TERM GOAL #2   Title Pt will decrease worst neck pain as reported on NPRS by at least 2 points in order to demonstrate clinically significant reduction in back pain.    Baseline 08/30/20: worst 8/10; 10/11/20: 8/10;    Time 8    Period Weeks    Status Not Met    Target Date 11/22/20      PT LONG TERM GOAL #3   Title Pt will report at least 30% improvement  in her neck symptoms in order to improve pain-free function at hom    Baseline 10/11/20: No change    Time 8    Period Weeks    Status Not Met    Target Date 11/22/20      PT LONG TERM GOAL #4   Title Pt will increase  self-selected 10MWT to at least 0.6 m/s in order to demonstrate clinically significant improvement in household and community ambulation    Baseline 09/04/20: self-selected: 26.8s = 0.37 m/s; 10/11/20: 17.6s = 0.57 m/s    Time 8    Period Weeks    Status Revised    Target Date 11/22/20      PT LONG TERM GOAL #5   Title Pt will improve BERG to at least 49/56 in order to demonstrate clinically significant improvement in balance and lower risk for falls.    Baseline 09/04/20: 40/56; 10/11/20: 46/56    Time 8    Period Weeks    Status Revised    Target Date 11/22/20                   Plan - 11/08/20 1205     Clinical Impression Statement Repeated standing and seated exercises with patient today.  She denies any increase in her pain with exercises. She continues to report improving strength with physical therapy.  Continued additional UE strengthening exercises due to UE weakness. Regular rest breaks provided throughout session. Pt advised to continue HEP and follow-up as scheduled. She will benefit from PT services to address deficits in pain, strength, balance, and mobility in order to improve function at home and decreased risk for falls.    Personal Factors and Comorbidities Age;Comorbidity 3+;Past/Current Experience;Time since onset of injury/illness/exacerbation    Comorbidities cervical fusion, lumbar fusion, HTN, chronic pain, TKR, R shoulder RTC tear s/p surgery    Examination-Activity Limitations Bend;Hygiene/Grooming;Dressing;Lift;Reach Overhead;Squat;Stairs;Stand    Examination-Participation Restrictions Cleaning;Community Activity;Laundry;Meal Prep;Shop    Stability/Clinical Decision Making Unstable/Unpredictable    Rehab Potential Fair    PT Frequency 2x /  week    PT Duration 12 weeks    PT Treatment/Interventions ADLs/Self Care Home Management;Aquatic Therapy;Biofeedback;Canalith Repostioning;Cryotherapy;Electrical Stimulation;Iontophoresis 73m/ml Dexamethasone;Moist Heat;Traction;Ultrasound;DME Instruction;Gait training;Stair training;Functional mobility training;Therapeutic activities;Therapeutic exercise;Balance training;Neuromuscular re-education;Patient/family education;Manual techniques;Passive range of motion;Dry needling;Vestibular;Spinal Manipulations;Joint Manipulations    PT Next Visit Plan LE strengthening, balance, neck modalities/strengthening    PT Home Exercise Plan Access Code: [email or web address]   Consulted and Agree with Plan of Care Patient                  Patient will benefit from skilled therapeutic intervention in order to improve the following deficits and impairments:  Abnormal gait, Decreased balance, Decreased strength, Pain  Visit Diagnosis: Muscle weakness (generalized)  Other abnormalities of gait and mobility     Problem List Patient Active Problem List   Diagnosis Date Noted   UTI (urinary tract infection) 08/03/2020   Chronic back pain    Hypertension    GERD (gastroesophageal reflux disease)    JLyndel SafeHuprich PT, DPT, GCS  Yaphet Smethurst 11/09/2020, 2:07 PM  [place] [facility] [place] [address]Phone: [phone]  Fax:  [phone] Name: HTakila KronbergMRN: [id number] of Birth: 807/27/1947

## 2020-11-13 ENCOUNTER — Other Ambulatory Visit: Payer: Self-pay

## 2020-11-13 ENCOUNTER — Ambulatory Visit: Payer: Medicare Other | Attending: Family Medicine

## 2020-11-13 DIAGNOSIS — M542 Cervicalgia: Secondary | ICD-10-CM | POA: Insufficient documentation

## 2020-11-13 DIAGNOSIS — R2689 Other abnormalities of gait and mobility: Secondary | ICD-10-CM | POA: Insufficient documentation

## 2020-11-13 DIAGNOSIS — M6281 Muscle weakness (generalized): Secondary | ICD-10-CM | POA: Insufficient documentation

## 2020-11-13 NOTE — Therapy (Signed)
[facility] [address]. [place], [place], [address] Phone: [phone]   Fax:  [phone]  Physical Therapy Treatment  Patient Details  Name: Marilyn Keith MRN: [id number] Date of Birth: 04-Nov-1945 Referring Provider (PT): Bubba Camp FNP   Encounter Date: 11/13/2020   PT End of Session - 11/13/20 1148     Visit Number 16    Number of Visits 25    Date for PT Re-Evaluation 11/22/20    Authorization Type Medicare (traditional); BCBS secondary    Authorization Time Period 08/30/20-11/22/20    PT Start Time 1145    PT Stop Time 1230    PT Time Calculation (min) 45 min    Activity Tolerance Patient limited by pain;Patient tolerated treatment well;Patient limited by fatigue    Behavior During Therapy [facility] for tasks assessed/performed               Past Medical History:  Diagnosis Date   Chronic back pain    GERD (gastroesophageal reflux disease)    Hypertension     Past Surgical History:  Procedure Laterality Date   BACK SURGERY     EYE SURGERY     MASTECTOMY     REPLACEMENT TOTAL KNEE BILATERAL     SHOULDER SURGERY Right     There were no vitals filed for this visit.    Subjective Assessment - 11/13/20 1148     Subjective Pt doing alright today. She continues with her chronic bilateral neck, shoulder, and knee pain. No falls since last therapy session. No specific questions or concerns.    Pertinent History Pt complains of neck and bilateral shoulder pain for the last 10 years. "If I don't take my Lyrica it hurts so bad that I cry." She also complains of constant headaches on the crown of her head. She takes a lot of Excedrin for the headaches. She also complains of bilateral shoulder pain which is independent from her neck. History of 3 rotator cuffs on her R shoulder and is currently unable to lift her R arm. She has been advised that she needs surgery for her shoulder but she is not interested in surgical  intervention at this time. She has a history of chronic back pain and reports extensive spinal fusions. "I'm all fused except for my neck." She reports a remote cervical fusion as well but does not recall which level. She complains of chronic R hip pain which prevents her from laying on her R side. She has had bilateral TKR and states that this caused a leg length difference in her lower extremities. Currently she wears a lift in her R shoe. She has a lot of R knee instability and wears a brace on her R knee to prevent it from buckling. Pt has had two to three falls in the last 6 months and many "almost falls." She uses and single point cane in RUE when out in the community and uses a rollator at home. History of physical therapy after her bilateral TKR and her spinal fusions. She also had an evaluation for her gait/balance in [place] but due to [place] pandemic was unable to return for treatment. While living in [place] she was getting trigger point injections for her neck pain which were helpful however since moving to [facility] she has not had any injections. She has had spinal injections in the past as well with very brief relief. History of R breast cancer s/p bilateral mastectomy. She has RUE lymphedema  and pt has a home compression unit but she doesn't use it because she states that it doesn't help.    Patient Stated Goals Decrease neck pain, improve R knee stability/gait/balance                TREATMENT   Ther-ex  NuStep L2-3 x 10 minutes BLE only during interval history (5 minutes unbilled);  Standing exercises with 5# ankle weights (AW): Hip flexion marches x 1 minute; Hip abduction x 1 minute; Hamstring curls x 1 minute; Hip extension x 1 minute;  Seated alternating LAQ with 5# AW 2 x 1 minute; Seated clams 2 x 1 minute, with green tband; Seated adductor ball squeeze 2 x 1 minute;  Standing mini squats x 10; Seated bicep curls with 4# dumbbells 2 x 10; Seated chest  press with cane and resistance from therapist 2 x 10; Seated rows with cane and resistance from therapist 2 x 10;   Pt educated throughout session about proper posture and technique with exercises. Improved exercise technique, movement at target joints, use of target muscles after min to mod verbal, visual, tactile cues.     Repeated standing and seated exercises with patient today.  She denies any increase in her pain with exercises but is starting to experience more appropriate muscle fatigue. Progressed ankle weights and dumbbells. She continues to report improving strength with physical therapy.  Continued additional UE strengthening exercises due to UE weakness. Regular rest breaks provided throughout session but duration of rest breaks continues to shorten between sessions. Pt advised to continue HEP and follow-up as scheduled. She will benefit from PT services to address deficits in pain, strength, balance, and mobility in order to improve function at home and decreased risk for falls.           PT Short Term Goals - 10/11/20 1213       PT SHORT TERM GOAL #1   Title Pt will be independent with HEP in order to decrease neck pain and improve balance/gait to improve pain-free function at home and decrease risk for falls    Time 6    Period Weeks    Status On-going    Target Date 10/11/20               PT Long Term Goals - 10/11/20 1213       PT LONG TERM GOAL #1   Title Pt will demonstrate decrease in NDI by at least 19% in order to demonstrate clinically significant reduction in disability related to neck pain    Baseline 08/30/20: 58%; 10/11/20: 64%    Time 12    Period Weeks    Status Not Met    Target Date 11/22/20      PT LONG TERM GOAL #2   Title Pt will decrease worst neck pain as reported on NPRS by at least 2 points in order to demonstrate clinically significant reduction in back pain.    Baseline 08/30/20: worst 8/10; 10/11/20: 8/10;    Time 8    Period  Weeks    Status Not Met    Target Date 11/22/20      PT LONG TERM GOAL #3   Title Pt will report at least 30% improvement in her neck symptoms in order to improve pain-free function at hom    Baseline 10/11/20: No change    Time 8    Period Weeks    Status Not Met    Target Date 11/22/20  PT LONG TERM GOAL #4   Title Pt will increase self-selected 10MWT to at least 0.6 m/s in order to demonstrate clinically significant improvement in household and community ambulation    Baseline 09/04/20: self-selected: 26.8s = 0.37 m/s; 10/11/20: 17.6s = 0.57 m/s    Time 8    Period Weeks    Status Revised    Target Date 11/22/20      PT LONG TERM GOAL #5   Title Pt will improve BERG to at least 49/56 in order to demonstrate clinically significant improvement in balance and lower risk for falls.    Baseline 09/04/20: 40/56; 10/11/20: 46/56    Time 8    Period Weeks    Status Revised    Target Date 11/22/20                   Plan - 11/13/20 1149     Clinical Impression Statement Repeated standing and seated exercises with patient today.  She denies any increase in her pain with exercises but is starting to experience more appropriate muscle fatigue. Progressed ankle weights and dumbbells. She continues to report improving strength with physical therapy.  Continued additional UE strengthening exercises due to UE weakness. Regular rest breaks provided throughout session but duration of rest breaks continues to shorten between sessions. Pt advised to continue HEP and follow-up as scheduled. She will benefit from PT services to address deficits in pain, strength, balance, and mobility in order to improve function at home and decreased risk for falls.    Personal Factors and Comorbidities Age;Comorbidity 3+;Past/Current Experience;Time since onset of injury/illness/exacerbation    Comorbidities cervical fusion, lumbar fusion, HTN, chronic pain, TKR, R shoulder RTC tear s/p surgery     Examination-Activity Limitations Bend;Hygiene/Grooming;Dressing;Lift;Reach Overhead;Squat;Stairs;Stand    Examination-Participation Restrictions Cleaning;Community Activity;Laundry;Meal Prep;Shop    Stability/Clinical Decision Making Unstable/Unpredictable    Rehab Potential Fair    PT Frequency 2x / week    PT Duration 12 weeks    PT Treatment/Interventions ADLs/Self Care Home Management;Aquatic Therapy;Biofeedback;Canalith Repostioning;Cryotherapy;Electrical Stimulation;Iontophoresis 71m/ml Dexamethasone;Moist Heat;Traction;Ultrasound;DME Instruction;Gait training;Stair training;Functional mobility training;Therapeutic activities;Therapeutic exercise;Balance training;Neuromuscular re-education;Patient/family education;Manual techniques;Passive range of motion;Dry needling;Vestibular;Spinal Manipulations;Joint Manipulations    PT Next Visit Plan LE strengthening, balance, neck modalities/strengthening    PT Home Exercise Plan Access Code: [email or web address]   Consulted and Agree with Plan of Care Patient                  Patient will benefit from skilled therapeutic intervention in order to improve the following deficits and impairments:  Abnormal gait, Decreased balance, Decreased strength, Pain  Visit Diagnosis: Muscle weakness (generalized)  Other abnormalities of gait and mobility     Problem List Patient Active Problem List   Diagnosis Date Noted   UTI (urinary tract infection) 08/03/2020   Chronic back pain    Hypertension    GERD (gastroesophageal reflux disease)    JLyndel SafeHuprich PT, DPT, GCS  Oasis Goehring 11/13/2020, 12:54 PM  [facility] [place] [address]Phone: [phone]  Fax:  [phone] Name: Marilyn WehlingMRN: [id number] of Birth: 803-29-1947

## 2020-11-15 ENCOUNTER — Ambulatory Visit: Payer: Medicare Other

## 2020-11-15 ENCOUNTER — Other Ambulatory Visit: Payer: Self-pay

## 2020-11-15 DIAGNOSIS — R2689 Other abnormalities of gait and mobility: Secondary | ICD-10-CM

## 2020-11-15 DIAGNOSIS — M6281 Muscle weakness (generalized): Secondary | ICD-10-CM | POA: Diagnosis not present

## 2020-11-15 NOTE — Therapy (Signed)
[facility] [address] [place], [place], [address] Phone: [phone]   Fax:  [phone]  Physical Therapy Treatment/Recertification  Patient Details  Name: Marilyn Keith MRN: [id number] Date of Birth: [birth year] Referring Provider (PT): Bubba Camp FNP   Encounter Date: 11/15/2020   PT End of Session - 11/15/20 1421     Visit Number 17    Number of Visits 50    Date for PT Re-Evaluation 02/07/21    Authorization Type Medicare (traditional); BCBS secondary    Authorization Time Period --    PT Start Time 1145    PT Stop Time 1230    PT Time Calculation (min) 45 min    Equipment Utilized During Treatment Gait belt    Activity Tolerance Patient tolerated treatment well    Behavior During Therapy WFL for tasks assessed/performed                Past Medical History:  Diagnosis Date   Chronic back pain    GERD (gastroesophageal reflux disease)    Hypertension     Past Surgical History:  Procedure Laterality Date   BACK SURGERY     EYE SURGERY     MASTECTOMY     REPLACEMENT TOTAL KNEE BILATERAL     SHOULDER SURGERY Right     There were no vitals filed for this visit.    Subjective Assessment - 11/15/20 1234     Subjective Pt doing alright today. She continues with her chronic bilateral neck, shoulder, and knee pain. No falls since last therapy session. No specific questions or concerns.    Pertinent History Pt complains of neck and bilateral shoulder pain for the last 10 years. "If I don't take my Lyrica it hurts so bad that I cry." She also complains of constant headaches on the crown of her head. She takes a lot of Excedrin for the headaches. She also complains of bilateral shoulder pain which is independent from her neck. History of 3 rotator cuffs on her R shoulder and is currently unable to lift her R arm. She has been advised that she needs surgery for her shoulder but she is not interested in  surgical intervention at this time. She has a history of chronic back pain and reports extensive spinal fusions. "I'm all fused except for my neck." She reports a remote cervical fusion as well but does not recall which level. She complains of chronic R hip pain which prevents her from laying on her R side. She has had bilateral TKR and states that this caused a leg length difference in her lower extremities. Currently she wears a lift in her R shoe. She has a lot of R knee instability and wears a brace on her R knee to prevent it from buckling. Pt has had two to three falls in the last 6 months and many "almost falls." She uses and single point cane in RUE when out in the community and uses a rollator at home. History of physical therapy after her bilateral TKR and her spinal fusions. She also had an evaluation for her gait/balance in [place] but due to [place] pandemic was unable to return for treatment. While living in [place] she was getting trigger point injections for her neck pain which were helpful however since moving to [facility] she has not had any injections. She has had spinal injections in the past as well with very brief relief. History of R breast cancer s/p bilateral mastectomy.  She has RUE lymphedema and pt has a home compression unit but she doesn't use it because she states that it doesn't help.    Patient Stated Goals Decrease neck pain, improve R knee stability/gait/balance               TREATMENT     Neuromuscular Re-education  Updated outcome measures and goals with patient: % improvement in neck pain: 5-6% better; NDI: 42% FOTO: 42   Worst neck pain: 8-9/10 Self-selected 16mgait speed: 16.6s =  0.699m BERG: 48/56   Ther-ex  Seated bicep curls with 4# dumbbells 2 x 15; Seated chest press with cane and resistance from therapist 2 x 15; Seated rows with cane and resistance from therapist 2 x 15; Seated shoulder flexion with cane for AAROM of R shoulder 2 x  8;    Manual Therapy  Light IASTM with long sweeping strokes to bilateral posterior cervical paraspinals, upper traps, and levator scapulae bilaterally, continued monitoring for patient response during intervention to assure no increase in pain.  Patient denies any increase in pain during manual techniques.   Pt educated throughout session about proper posture and technique with exercises. Improved exercise technique, movement at target joints, use of target muscles after min to mod verbal, visual, tactile cues.      Patient arrives with excellent motivation wrist pain therapy.  Updated outcome measures and goals with patient during session today.  Patient reports 5-6% improvement in neck pain since starting with therapy. Her NDI decreased from 64% when it was last updated to 45% today however her FOTO decreased to 42 as well. Her worst reported neck pain is unchanged around 8/10. Her 1018mit speed has improved slightly to 0.60 m/s. Improvement noted in her balance with BERG balance test improving from 40/56 at initial evaluation to 48/56 today. Therapy has primarily focused on balance and strength given irritability of neck pain however introduced some light soft tissue mobilization to upper back and cervical paraspinals during session today.  Patient denies any increase in her neck pain with manual techniques and will monitor her response at next therapy visit. She does still continue to get fatigued during therapy sessions requiring intermittent rest breaks however her endurance has improved significantly since starting with therapy. Pt advised to continue HEP and follow-up as scheduled.  We will continue to introduce additional manual techniques and strengthening for her neck as she is able to tolerate.  She will benefit from PT services to address deficits in pain, strength, balance, and mobility in order to improve function at home and decreased risk for falls.             PT Short Term  Goals - 11/15/20 1149       PT SHORT TERM GOAL #1   Title Pt will be independent with HEP in order to decrease neck pain and improve balance/gait to improve pain-free function at home and decrease risk for falls    Time 6    Period Weeks    Status On-going    Target Date 12/28/20               PT Long Term Goals - 11/15/20 1145       PT LONG TERM GOAL #1   Title Pt will demonstrate decrease in NDI by at least 19% in order to demonstrate clinically significant reduction in disability related to neck pain    Baseline 08/30/20: 58%; 10/11/20: 64%;11/15/20: 42%    Time 12  Period Weeks    Status Partially Met    Target Date 02/07/21      PT LONG TERM GOAL #2   Title Pt will decrease worst neck pain as reported on NPRS by at least 2 points in order to demonstrate clinically significant reduction in back pain.    Baseline 08/30/20: worst 8/10; 10/11/20: 8/10; 11/15/20: 8/10;    Time 12    Period Weeks    Status On-going    Target Date 02/07/21      PT LONG TERM GOAL #3   Title Pt will report at least 30% improvement in her neck symptoms in order to improve pain-free function at hom    Baseline 10/11/20: No change; 11/15/20: 5-6%    Time 12    Period Weeks    Status Partially Met    Target Date 02/07/21      PT LONG TERM GOAL #4   Title Pt will increase self-selected 10MWT to at least 0.6 m/s in order to demonstrate clinically significant improvement in household and community ambulation    Baseline 09/04/20: self-selected: 26.8s = 0.37 m/s; 10/11/20: 17.6s = 0.57 m/s; 11/15/20: 16.6s =  0.68ms    Time 8    Period Weeks    Status Revised    Target Date 02/07/21      PT LONG TERM GOAL #5   Title Pt will improve BERG to at least 49/56 in order to demonstrate clinically significant improvement in balance and lower risk for falls.    Baseline 09/04/20: 40/56; 10/11/20: 46/56; 11/15/20: 48/56    Time 8    Period Weeks    Status Partially Met    Target Date 02/07/21                    Plan - 11/15/20 1421     Clinical Impression Statement Patient arrives with excellent motivation wrist pain therapy.  Updated outcome measures and goals with patient during session today.  Patient reports 5-6% improvement in neck pain since starting with therapy. Her NDI decreased from 64% when it was last updated to 45% today however her FOTO decreased to 42 as well. Her worst reported neck pain is unchanged around 8/10. Her 161mait speed has improved slightly to 0.60 m/s. Improvement noted in her balance with BERG balance test improving from 40/56 at initial evaluation to 48/56 today. Therapy has primarily focused on balance and strength given irritability of neck pain however introduced some light soft tissue mobilization to upper back and cervical paraspinals during session today.  Patient denies any increase in her neck pain with manual techniques and will monitor her response at next therapy visit. She does still continue to get fatigued during therapy sessions requiring intermittent rest breaks however her endurance has improved significantly since starting with therapy. Pt advised to continue HEP and follow-up as scheduled.  We will continue to introduce additional manual techniques and strengthening for her neck as she is able to tolerate.  She will benefit from PT services to address deficits in pain, strength, balance, and mobility in order to improve function at home and decreased risk for falls.?    Personal Factors and Comorbidities Age;Comorbidity 3+;Past/Current Experience;Time since onset of injury/illness/exacerbation    Comorbidities cervical fusion, lumbar fusion, HTN, chronic pain, TKR, R shoulder RTC tear s/p surgery    Examination-Activity Limitations Bend;Hygiene/Grooming;Dressing;Lift;Reach Overhead;Squat;Stairs;Stand    Examination-Participation Restrictions Cleaning;Community Activity;Laundry;Meal Prep;Shop    Stability/Clinical Decision Making  Unstable/Unpredictable    Rehab Potential  Fair    PT Frequency 2x / week    PT Duration 12 weeks    PT Treatment/Interventions ADLs/Self Care Home Management;Aquatic Therapy;Biofeedback;Canalith Repostioning;Cryotherapy;Electrical Stimulation;Iontophoresis 75m/ml Dexamethasone;Moist Heat;Traction;Ultrasound;DME Instruction;Gait training;Stair training;Functional mobility training;Therapeutic activities;Therapeutic exercise;Balance training;Neuromuscular re-education;Patient/family education;Manual techniques;Passive range of motion;Dry needling;Vestibular;Spinal Manipulations;Joint Manipulations    PT Next Visit Plan LE strengthening, balance, neck modalities/strengthening    PT Home Exercise Plan Access Code: [email or web address]   Consulted and Agree with Plan of Care Patient                   Patient will benefit from skilled therapeutic intervention in order to improve the following deficits and impairments:  Abnormal gait, Decreased balance, Decreased strength, Pain  Visit Diagnosis: Muscle weakness (generalized)  Other abnormalities of gait and mobility     Problem List Patient Active Problem List   Diagnosis Date Noted   UTI (urinary tract infection) 08/03/2020   Chronic back pain    Hypertension    GERD (gastroesophageal reflux disease)    JLyndel SafeHuprich PT, DPT, GCS  Marilyn Keith 11/16/2020, 8:13 AM  [facility] [place] [address]Phone: [phone]  Fax:  [phone] Name: Marilyn GuevaraMRN: [id number] of Birth: [birth year]

## 2020-11-19 ENCOUNTER — Other Ambulatory Visit: Payer: Self-pay

## 2020-11-19 ENCOUNTER — Ambulatory Visit: Payer: Medicare Other

## 2020-11-19 DIAGNOSIS — M542 Cervicalgia: Secondary | ICD-10-CM

## 2020-11-19 DIAGNOSIS — M6281 Muscle weakness (generalized): Secondary | ICD-10-CM

## 2020-11-19 DIAGNOSIS — R2689 Other abnormalities of gait and mobility: Secondary | ICD-10-CM

## 2020-11-19 NOTE — Therapy (Signed)
[facility] [address] [place], [place], [address] Phone: [phone]   Fax:  [phone]  Physical Therapy Treatment  Patient Details  Name: Marilyn Keith MRN: [id number] Date of Birth: Oct 08, 1945 Referring Provider (PT): Bubba Camp FNP   Encounter Date: 11/19/2020   PT End of Session - 11/19/20 1150     Visit Number 18    Number of Visits 70    Date for PT Re-Evaluation 02/07/21    Authorization Type Medicare (traditional); BCBS secondary    PT Start Time 1148    PT Stop Time 1230    PT Time Calculation (min) 42 min    Equipment Utilized During Treatment Gait belt    Activity Tolerance Patient tolerated treatment well    Behavior During Therapy WFL for tasks assessed/performed                Past Medical History:  Diagnosis Date   Chronic back pain    GERD (gastroesophageal reflux disease)    Hypertension     Past Surgical History:  Procedure Laterality Date   BACK SURGERY     EYE SURGERY     MASTECTOMY     REPLACEMENT TOTAL KNEE BILATERAL     SHOULDER SURGERY Right     There were no vitals filed for this visit.    Subjective Assessment - 11/19/20 1147     Subjective Pt doing alright today.  No updates since last therapy session.  She continues with her chronic bilateral neck, shoulder, and knee pain. No recent falls. No specific questions or concerns.    Pertinent History Pt complains of neck and bilateral shoulder pain for the last 10 years. "If I don't take my Lyrica it hurts so bad that I cry." She also complains of constant headaches on the crown of her head. She takes a lot of Excedrin for the headaches. She also complains of bilateral shoulder pain which is independent from her neck. History of 3 rotator cuffs on her R shoulder and is currently unable to lift her R arm. She has been advised that she needs surgery for her shoulder but she is not interested in surgical intervention at this  time. She has a history of chronic back pain and reports extensive spinal fusions. "I'm all fused except for my neck." She reports a remote cervical fusion as well but does not recall which level. She complains of chronic R hip pain which prevents her from laying on her R side. She has had bilateral TKR and states that this caused a leg length difference in her lower extremities. Currently she wears a lift in her R shoe. She has a lot of R knee instability and wears a brace on her R knee to prevent it from buckling. Pt has had two to three falls in the last 6 months and many "almost falls." She uses and single point cane in RUE when out in the community and uses a rollator at home. History of physical therapy after her bilateral TKR and her spinal fusions. She also had an evaluation for her gait/balance in [place] but due to [place] pandemic was unable to return for treatment. While living in [place] she was getting trigger point injections for her neck pain which were helpful however since moving to [facility] she has not had any injections. She has had spinal injections in the past as well with very brief relief. History of R breast cancer s/p bilateral mastectomy. She has  RUE lymphedema and pt has a home compression unit but she doesn't use it because she states that it doesn't help.    Patient Stated Goals Decrease neck pain, improve R knee stability/gait/balance               TREATMENT   Ther-ex  Seated bicep curls with 4# dumbbells 2 x 15; Seated chest press with cane and resistance from therapist 2 x 15; Seated rows with cane and resistance from therapist 2 x 20; Seated shoulder flexion with cane for AAROM of R shoulder x 10, second set deferred due to increase in neck pain;  Standing exercises with 5# ankle weights (AW): Hip flexion marches x 1 minute; Hip abduction x 1 minute; Hamstring curls x 1 minute; Hip extension x 1 minute;  Seated alternating LAQ with 5# AW 2 x 1  minute; Seated clams 2 x 1 minute, with green tband; Seated adductor ball squeeze 2 x 1 minute;    Manual Therapy  Light IASTM with long sweeping strokes to bilateral posterior cervical paraspinals, upper traps, and levator scapulae bilaterally, continued monitoring for patient response during intervention to assure no increase in pain.  Patient denies any increase in pain during manual techniques.   Pt educated throughout session about proper posture and technique with exercises. Improved exercise technique, movement at target joints, use of target muscles after min to mod verbal, visual, tactile cues.      Patient denies any increase in her neck pain with manual techniques after last session and no worsening of symptoms during session today. She does still continue to get fatigued during therapy sessions requiring intermittent rest breaks. Continued with UE strengthening to improve upper back/neck pain. Pt advised to continue HEP and follow-up as scheduled.  We will continue to introduce additional manual techniques and strengthening for her neck as she is able to tolerate.  She will benefit from PT services to address deficits in pain, strength, balance, and mobility in order to improve function at home and decreased risk for falls.             PT Short Term Goals - 11/15/20 1149       PT SHORT TERM GOAL #1   Title Pt will be independent with HEP in order to decrease neck pain and improve balance/gait to improve pain-free function at home and decrease risk for falls    Time 6    Period Weeks    Status On-going    Target Date 12/28/20               PT Long Term Goals - 11/15/20 1145       PT LONG TERM GOAL #1   Title Pt will demonstrate decrease in NDI by at least 19% in order to demonstrate clinically significant reduction in disability related to neck pain    Baseline 08/30/20: 58%; 10/11/20: 64%;11/15/20: 42%    Time 12    Period Weeks    Status Partially Met     Target Date 02/07/21      PT LONG TERM GOAL #2   Title Pt will decrease worst neck pain as reported on NPRS by at least 2 points in order to demonstrate clinically significant reduction in back pain.    Baseline 08/30/20: worst 8/10; 10/11/20: 8/10; 11/15/20: 8/10;    Time 12    Period Weeks    Status On-going    Target Date 02/07/21      PT LONG TERM GOAL #3  Title Pt will report at least 30% improvement in her neck symptoms in order to improve pain-free function at hom    Baseline 10/11/20: No change; 11/15/20: 5-6%    Time 12    Period Weeks    Status Partially Met    Target Date 02/07/21      PT LONG TERM GOAL #4   Title Pt will increase self-selected 10MWT to at least 0.6 m/s in order to demonstrate clinically significant improvement in household and community ambulation    Baseline 09/04/20: self-selected: 26.8s = 0.37 m/s; 10/11/20: 17.6s = 0.57 m/s; 11/15/20: 16.6s =  0.90ms    Time 8    Period Weeks    Status Revised    Target Date 02/07/21      PT LONG TERM GOAL #5   Title Pt will improve BERG to at least 49/56 in order to demonstrate clinically significant improvement in balance and lower risk for falls.    Baseline 09/04/20: 40/56; 10/11/20: 46/56; 11/15/20: 48/56    Time 8    Period Weeks    Status Partially Met    Target Date 02/07/21                   Plan - 11/19/20 1151     Clinical Impression Statement Patient denies any increase in her neck pain with manual techniques after last session and no worsening of symptoms during session today. She does still continue to get fatigued during therapy sessions requiring intermittent rest breaks. Continued with UE strengthening to improve upper back/neck pain. Pt advised to continue HEP and follow-up as scheduled.  We will continue to introduce additional manual techniques and strengthening for her neck as she is able to tolerate.  She will benefit from PT services to address deficits in pain, strength, balance, and  mobility in order to improve function at home and decreased risk for falls.    Personal Factors and Comorbidities Age;Comorbidity 3+;Past/Current Experience;Time since onset of injury/illness/exacerbation    Comorbidities cervical fusion, lumbar fusion, HTN, chronic pain, TKR, R shoulder RTC tear s/p surgery    Examination-Activity Limitations Bend;Hygiene/Grooming;Dressing;Lift;Reach Overhead;Squat;Stairs;Stand    Examination-Participation Restrictions Cleaning;Community Activity;Laundry;Meal Prep;Shop    Stability/Clinical Decision Making Unstable/Unpredictable    Rehab Potential Fair    PT Frequency 2x / week    PT Duration 12 weeks    PT Treatment/Interventions ADLs/Self Care Home Management;Aquatic Therapy;Biofeedback;Canalith Repostioning;Cryotherapy;Electrical Stimulation;Iontophoresis 469mml Dexamethasone;Moist Heat;Traction;Ultrasound;DME Instruction;Gait training;Stair training;Functional mobility training;Therapeutic activities;Therapeutic exercise;Balance training;Neuromuscular re-education;Patient/family education;Manual techniques;Passive range of motion;Dry needling;Vestibular;Spinal Manipulations;Joint Manipulations    PT Next Visit Plan LE strengthening, balance, neck modalities/strengthening    PT Home Exercise Plan Access Code: [email or web address]  Consulted and Agree with Plan of Care Patient                    Patient will benefit from skilled therapeutic intervention in order to improve the following deficits and impairments:  Abnormal gait, Decreased balance, Decreased strength, Pain  Visit Diagnosis: Muscle weakness (generalized)  Other abnormalities of gait and mobility  Cervicalgia     Problem List Patient Active Problem List   Diagnosis Date Noted   UTI (urinary tract infection) 08/03/2020   Chronic back pain    Hypertension    GERD (gastroesophageal reflux disease)    Marilyn Keith PT, DPT, GCS  Marilyn Keith 11/19/2020, 1:47 PM  [facility]MeS[place]hone: [phone] Fax:  [phone]  Name: Marilyn Keith MRN: [id number] Date of Birth: 1945/06/25

## 2020-11-21 ENCOUNTER — Ambulatory Visit: Payer: Medicare Other

## 2020-11-21 ENCOUNTER — Other Ambulatory Visit: Payer: Self-pay

## 2020-11-21 DIAGNOSIS — M6281 Muscle weakness (generalized): Secondary | ICD-10-CM

## 2020-11-21 DIAGNOSIS — R2689 Other abnormalities of gait and mobility: Secondary | ICD-10-CM

## 2020-11-21 DIAGNOSIS — M542 Cervicalgia: Secondary | ICD-10-CM

## 2020-11-21 NOTE — Therapy (Signed)
[facility] [address]. [place], [place], [address] Phone: [phone]   Fax:  [phone]  Physical Therapy Treatment  Patient Details  Name: Marilyn Keith MRN: [id number] Date of Birth: 1945/08/06 Referring Provider (PT): Bubba Camp FNP   Encounter Date: 11/21/2020   PT End of Session - 11/21/20 1427     Visit Number 19    Number of Visits 48    Date for PT Re-Evaluation 02/07/21    Authorization Type Medicare (traditional); BCBS secondary    PT Start Time 1100    PT Stop Time 1145    PT Time Calculation (min) 45 min    Equipment Utilized During Treatment Gait belt    Activity Tolerance Patient tolerated treatment well    Behavior During Therapy WFL for tasks assessed/performed                 Past Medical History:  Diagnosis Date   Chronic back pain    GERD (gastroesophageal reflux disease)    Hypertension     Past Surgical History:  Procedure Laterality Date   BACK SURGERY     EYE SURGERY     MASTECTOMY     REPLACEMENT TOTAL KNEE BILATERAL     SHOULDER SURGERY Right     There were no vitals filed for this visit.    Subjective Assessment - 11/21/20 1107     Subjective Pt doing alright today.  No updates since last therapy session.  She continues with her chronic bilateral neck, shoulder, and knee pain. No excessive soreness after the last therapy session and in fact reports that she feels like the muscles in her upper back/neck are "starting to wake up." No recent falls. No specific questions or concerns.    Pertinent History Pt complains of neck and bilateral shoulder pain for the last 10 years. "If I don't take my Lyrica it hurts so bad that I cry." She also complains of constant headaches on the crown of her head. She takes a lot of Excedrin for the headaches. She also complains of bilateral shoulder pain which is independent from her neck. History of 3 rotator cuffs on her R shoulder and is  currently unable to lift her R arm. She has been advised that she needs surgery for her shoulder but she is not interested in surgical intervention at this time. She has a history of chronic back pain and reports extensive spinal fusions. "I'm all fused except for my neck." She reports a remote cervical fusion as well but does not recall which level. She complains of chronic R hip pain which prevents her from laying on her R side. She has had bilateral TKR and states that this caused a leg length difference in her lower extremities. Currently she wears a lift in her R shoe. She has a lot of R knee instability and wears a brace on her R knee to prevent it from buckling. Pt has had two to three falls in the last 6 months and many "almost falls." She uses and single point cane in RUE when out in the community and uses a rollator at home. History of physical therapy after her bilateral TKR and her spinal fusions. She also had an evaluation for her gait/balance in [place] but due to [place] pandemic was unable to return for treatment. While living in [place] she was getting trigger point injections for her neck pain which were helpful however since moving to [facility] she has  not had any injections. She has had spinal injections in the past as well with very brief relief. History of R breast cancer s/p bilateral mastectomy. She has RUE lymphedema and pt has a home compression unit but she doesn't use it because she states that it doesn't help.    Patient Stated Goals Decrease neck pain, improve R knee stability/gait/balance               TREATMENT   Ther-ex  NuStep L2-3 x 10 minutes for warm-up with therapist monitoring and adjusting resistance as needed to match intensity; Seated bicep curls with 4# dumbbells 2 x 10; Seated chest press with cane and resistance from therapist 2 x 25; Seated rows with cane and resistance from therapist 2 x 20; Attempted seated AAROM using LUE to lift RUE however  pt demonstrates excessive straining and bilateral upper trap hiking so discontinued; Seated tricep press down with cane and therapist providing resistance 2 x 15;  Seated alternating LAQ with 5# AW x 1 minute; Seated clams x 1 minute, with green tband; Seated adductor ball squeeze x 1 minute;    Manual Therapy  Light IASTM with long sweeping strokes to bilateral posterior cervical paraspinals, upper traps, and levator scapulae bilaterally, continued monitoring for patient response during intervention to assure no increase in pain.  Patient denies any increase in pain during manual techniques.   Pt educated throughout session about proper posture and technique with exercises. Improved exercise technique, movement at target joints, use of target muscles after min to mod verbal, visual, tactile cues.      Patient denies any increase in her neck pain with manual techniques after last session and no worsening of symptoms during session today. She is able to tolerate increased pressure by therapist without an increase in her pain. She is demonstrating improved endurance during exercises. Increased repetitions during UE strengthening today and added tricep press downs. Pt advised to continue HEP and follow-up as scheduled.  We will continue to introduce additional manual techniques and strengthening for her neck as she is able to tolerate.  She will need a progress note at next visit. She will benefit from PT services to address deficits in pain, strength, balance, and mobility in order to improve function at home and decreased risk for falls.             PT Short Term Goals - 11/15/20 1149       PT SHORT TERM GOAL #1   Title Pt will be independent with HEP in order to decrease neck pain and improve balance/gait to improve pain-free function at home and decrease risk for falls    Time 6    Period Weeks    Status On-going    Target Date 12/28/20               PT Long Term Goals -  11/15/20 1145       PT LONG TERM GOAL #1   Title Pt will demonstrate decrease in NDI by at least 19% in order to demonstrate clinically significant reduction in disability related to neck pain    Baseline 08/30/20: 58%; 10/11/20: 64%;11/15/20: 42%    Time 12    Period Weeks    Status Partially Met    Target Date 02/07/21      PT LONG TERM GOAL #2   Title Pt will decrease worst neck pain as reported on NPRS by at least 2 points in order to demonstrate clinically significant reduction in  back pain.    Baseline 08/30/20: worst 8/10; 10/11/20: 8/10; 11/15/20: 8/10;    Time 12    Period Weeks    Status On-going    Target Date 02/07/21      PT LONG TERM GOAL #3   Title Pt will report at least 30% improvement in her neck symptoms in order to improve pain-free function at hom    Baseline 10/11/20: No change; 11/15/20: 5-6%    Time 12    Period Weeks    Status Partially Met    Target Date 02/07/21      PT LONG TERM GOAL #4   Title Pt will increase self-selected 10MWT to at least 0.6 m/s in order to demonstrate clinically significant improvement in household and community ambulation    Baseline 09/04/20: self-selected: 26.8s = 0.37 m/s; 10/11/20: 17.6s = 0.57 m/s; 11/15/20: 16.6s =  0.54ms    Time 8    Period Weeks    Status Revised    Target Date 02/07/21      PT LONG TERM GOAL #5   Title Pt will improve BERG to at least 49/56 in order to demonstrate clinically significant improvement in balance and lower risk for falls.    Baseline 09/04/20: 40/56; 10/11/20: 46/56; 11/15/20: 48/56    Time 8    Period Weeks    Status Partially Met    Target Date 02/07/21                   Plan - 11/21/20 1428     Clinical Impression Statement Patient denies any increase in her neck pain with manual techniques after last session and no worsening of symptoms during session today. She is able to tolerate increased pressure by therapist without an increase in her pain. She is demonstrating improved  endurance during exercises. Increased repetitions during UE strengthening today and added tricep press downs. Pt advised to continue HEP and follow-up as scheduled.  We will continue to introduce additional manual techniques and strengthening for her neck as she is able to tolerate.  She will need a progress note at next visit. She will benefit from PT services to address deficits in pain, strength, balance, and mobility in order to improve function at home and decreased risk for falls.?    Personal Factors and Comorbidities Age;Comorbidity 3+;Past/Current Experience;Time since onset of injury/illness/exacerbation    Comorbidities cervical fusion, lumbar fusion, HTN, chronic pain, TKR, R shoulder RTC tear s/p surgery    Examination-Activity Limitations Bend;Hygiene/Grooming;Dressing;Lift;Reach Overhead;Squat;Stairs;Stand    Examination-Participation Restrictions Cleaning;Community Activity;Laundry;Meal Prep;Shop    Stability/Clinical Decision Making Unstable/Unpredictable    Rehab Potential Fair    PT Frequency 2x / week    PT Duration 12 weeks    PT Treatment/Interventions ADLs/Self Care Home Management;Aquatic Therapy;Biofeedback;Canalith Repostioning;Cryotherapy;Electrical Stimulation;Iontophoresis 47mml Dexamethasone;Moist Heat;Traction;Ultrasound;DME Instruction;Gait training;Stair training;Functional mobility training;Therapeutic activities;Therapeutic exercise;Balance training;Neuromuscular re-education;Patient/family education;Manual techniques;Passive range of motion;Dry needling;Vestibular;Spinal Manipulations;Joint Manipulations    PT Next Visit Plan Progress note, LE strengthening, balance, neck modalities/strengthening    PT Home Exercise Plan Access Code: [email or web address]  Consulted and Agree with Plan of Care Patient                     Patient will benefit from skilled therapeutic intervention in order to improve the following deficits and impairments:  Abnormal gait, Decreased  balance, Decreased strength, Pain  Visit Diagnosis: Muscle weakness (generalized)  Other abnormalities of gait and mobility  Cervicalgia     Problem List Patient  Active Problem List   Diagnosis Date Noted   UTI (urinary tract infection) 08/03/2020   Chronic back pain    Hypertension    GERD (gastroesophageal reflux disease)    Lyndel Safe Marceil Welp PT, DPT, GCS  Alpa Salvo 11/21/2020, 2:36 PM  [facility] [address]. [place], [place], [address] Phone: [phone]   Fax:  [phone]  Name: Marilyn Keith MRN: [id number] Date of Birth: [birth year]

## 2020-11-26 ENCOUNTER — Ambulatory Visit: Payer: Medicare Other

## 2020-11-28 ENCOUNTER — Ambulatory Visit: Payer: Medicare Other

## 2020-11-28 ENCOUNTER — Other Ambulatory Visit: Payer: Self-pay

## 2020-11-28 DIAGNOSIS — M6281 Muscle weakness (generalized): Secondary | ICD-10-CM | POA: Diagnosis not present

## 2020-11-28 DIAGNOSIS — M542 Cervicalgia: Secondary | ICD-10-CM

## 2020-11-28 NOTE — Therapy (Signed)
[facility] [address]. [place], [place], [address] Phone: [phone]   Fax:  [phone]  Physical Therapy Progress Note   Dates of reporting period  10/11/20   to   11/28/20   Patient Details  Name: Marilyn Keith MRN: [id number] Date of Birth: Aug 24, 1945 Referring Provider (PT): Bubba Camp FNP   Encounter Date: 11/28/2020   PT End of Session - 11/28/20 1144     Visit Number 20    Number of Visits 65    Date for PT Re-Evaluation 02/07/21    Authorization Type Medicare (traditional); BCBS secondary    PT Start Time 1937    PT Stop Time 1230    PT Time Calculation (min) 45 min    Equipment Utilized During Treatment Gait belt    Activity Tolerance Patient tolerated treatment well    Behavior During Therapy WFL for tasks assessed/performed                 Past Medical History:  Diagnosis Date   Chronic back pain    GERD (gastroesophageal reflux disease)    Hypertension     Past Surgical History:  Procedure Laterality Date   BACK SURGERY     EYE SURGERY     MASTECTOMY     REPLACEMENT TOTAL KNEE BILATERAL     SHOULDER SURGERY Right     There were no vitals filed for this visit.    Subjective Assessment - 11/28/20 1144     Subjective Pt doing alright today.  No updates since last therapy session.  She continues with her chronic bilateral neck, shoulder, and knee pain. States that she has had some more mid back pain over the last couple days. No excessive soreness after the last therapy session. No recent falls. No specific questions or concerns.    Pertinent History Pt complains of neck and bilateral shoulder pain for the last 10 years. "If I don't take my Lyrica it hurts so bad that I cry." She also complains of constant headaches on the crown of her head. She takes a lot of Excedrin for the headaches. She also complains of bilateral shoulder pain which is independent from her neck. History of 3  rotator cuffs on her R shoulder and is currently unable to lift her R arm. She has been advised that she needs surgery for her shoulder but she is not interested in surgical intervention at this time. She has a history of chronic back pain and reports extensive spinal fusions. "I'm all fused except for my neck." She reports a remote cervical fusion as well but does not recall which level. She complains of chronic R hip pain which prevents her from laying on her R side. She has had bilateral TKR and states that this caused a leg length difference in her lower extremities. Currently she wears a lift in her R shoe. She has a lot of R knee instability and wears a brace on her R knee to prevent it from buckling. Pt has had two to three falls in the last 6 months and many "almost falls." She uses and single point cane in RUE when out in the community and uses a rollator at home. History of physical therapy after her bilateral TKR and her spinal fusions. She also had an evaluation for her gait/balance in [place] but due to [place] pandemic was unable to return for treatment. While living in [place] she was getting trigger point injections for her neck  pain which were helpful however since moving to [facility] she has not had any injections. She has had spinal injections in the past as well with very brief relief. History of R breast cancer s/p bilateral mastectomy. She has RUE lymphedema and pt has a home compression unit but she doesn't use it because she states that it doesn't help.    Patient Stated Goals Decrease neck pain, improve R knee stability/gait/balance               TREATMENT   Ther-ex  Seated bicep curls with 4# dumbbells 2 x 10; Seated chest press with cane and resistance from therapist (increased resistance today) 2 x 20; Seated L shoulder flexion with 1# dumbbell 2 x 10; Seated L shoulder scaption with 1# dumbbell x 10, x 20; Seated rows with cane and resistance from therapist 2 x  25; Seated tricep press down with cane and therapist providing resistance 2 x 20;  Seated alternating LAQ with 5# AW 2 x 1 minute; Seated clams 2 x 1 minute, with blue tband; Seated adductor ball squeeze 2 x 1 minute;    Manual Therapy  Light IASTM with long sweeping strokes to bilateral posterior cervical paraspinals, upper traps, and levator scapulae bilaterally, continued monitoring for patient response during intervention to assure no increase in pain.  Patient denies any increase in pain during manual techniques.   Pt educated throughout session about proper posture and technique with exercises. Improved exercise technique, movement at target joints, use of target muscles after min to mod verbal, visual, tactile cues.    Updated outcome measures and goals with patient during 11/15/20 visit so not updated again today. At that time patient reported 5-6% improvement in her neck pain since starting with therapy. Her NDI decreased from 64% to 45%, however, her FOTO decreased to 42 as well. Her worst reported neck pain was unchanged around 8/10. Since that visit more time has spent during therapy to address her neck pain. On the 11/15/20 visit her 30mgait speed had improved slightly to 0.60 m/s. Improvement noted in her balance with BERG balance test improving from 40/56 at initial evaluation to 48/56. Patient denies any increase in her neck pain with manual techniques today and additional difficulty added to UE strengthening today. She does still continue to get fatigued during therapy sessions requiring intermittent rest breaks however her endurance has improved significantly since starting with therapy. Pt advised to continue HEP and follow-up as scheduled.  We will continue to introduce additional manual techniques and strengthening for her neck as she is able to tolerate.  She will benefit from PT services to address deficits in pain, strength, balance, and mobility in order to improve function at  home and decreased risk for falls.               PT Short Term Goals - 11/28/20 1436       PT SHORT TERM GOAL #1   Title Pt will be independent with HEP in order to decrease neck pain and improve balance/gait to improve pain-free function at home and decrease risk for falls    Time 6    Period Weeks    Status On-going    Target Date 12/28/20               PT Long Term Goals - 11/28/20 1145       PT LONG TERM GOAL #1   Title Pt will demonstrate decrease in NDI by at least 19%  in order to demonstrate clinically significant reduction in disability related to neck pain    Baseline 08/30/20: 58%; 10/11/20: 64%;11/15/20: 42%    Time 12    Period Weeks    Status Partially Met    Target Date 02/07/21      PT LONG TERM GOAL #2   Title Pt will decrease worst neck pain as reported on NPRS by at least 2 points in order to demonstrate clinically significant reduction in back pain.    Baseline 08/30/20: worst 8/10; 10/11/20: 8/10; 11/15/20: 8/10;    Time 12    Period Weeks    Status On-going    Target Date 02/07/21      PT LONG TERM GOAL #3   Title Pt will report at least 30% improvement in her neck symptoms in order to improve pain-free function at hom    Baseline 10/11/20: No change; 11/15/20: 5-6%    Time 12    Period Weeks    Status Partially Met    Target Date 02/07/21      PT LONG TERM GOAL #4   Title Pt will increase self-selected 10MWT to at least 0.6 m/s in order to demonstrate clinically significant improvement in household and community ambulation    Baseline 09/04/20: self-selected: 26.8s = 0.37 m/s; 10/11/20: 17.6s = 0.57 m/s; 11/15/20: 16.6s =  0.67ms    Time 8    Period Weeks    Status Revised    Target Date 02/07/21      PT LONG TERM GOAL #5   Title Pt will improve BERG to at least 49/56 in order to demonstrate clinically significant improvement in balance and lower risk for falls.    Baseline 09/04/20: 40/56; 10/11/20: 46/56; 11/15/20: 48/56    Time 8    Period  Weeks    Status Partially Met    Target Date 02/07/21                   Plan - 11/28/20 1145     Clinical Impression Statement Updated outcome measures and goals with patient during 11/15/20 visit so not updated again today. At that time patient reported 5-6% improvement in her neck pain since starting with therapy. Her NDI decreased from 64% to 45%, however, her FOTO decreased to 42 as well. Her worst reported neck pain was unchanged around 8/10. Since that visit more time has spent during therapy to address her neck pain. On the 11/15/20 visit her 157mait speed had improved slightly to 0.60 m/s. Improvement noted in her balance with BERG balance test improving from 40/56 at initial evaluation to 48/56. Patient denies any increase in her neck pain with manual techniques today and additional difficulty added to UE strengthening today. She does still continue to get fatigued during therapy sessions requiring intermittent rest breaks however her endurance has improved significantly since starting with therapy. Pt advised to continue HEP and follow-up as scheduled.  We will continue to introduce additional manual techniques and strengthening for her neck as she is able to tolerate.  She will benefit from PT services to address deficits in pain, strength, balance, and mobility in order to improve function at home and decreased risk for falls.    Personal Factors and Comorbidities Age;Comorbidity 3+;Past/Current Experience;Time since onset of injury/illness/exacerbation    Comorbidities cervical fusion, lumbar fusion, HTN, chronic pain, TKR, R shoulder RTC tear s/p surgery    Examination-Activity Limitations Bend;Hygiene/Grooming;Dressing;Lift;Reach Overhead;Squat;Stairs;Stand    Examination-Participation Restrictions Cleaning;Community Activity;Laundry;Meal Prep;Shop  Stability/Clinical Decision Making Unstable/Unpredictable    Rehab Potential Fair    PT Frequency 2x / week    PT Duration 12  weeks    PT Treatment/Interventions ADLs/Self Care Home Management;Aquatic Therapy;Biofeedback;Canalith Repostioning;Cryotherapy;Electrical Stimulation;Iontophoresis 25m/ml Dexamethasone;Moist Heat;Traction;Ultrasound;DME Instruction;Gait training;Stair training;Functional mobility training;Therapeutic activities;Therapeutic exercise;Balance training;Neuromuscular re-education;Patient/family education;Manual techniques;Passive range of motion;Dry needling;Vestibular;Spinal Manipulations;Joint Manipulations    PT Next Visit Plan LE strengthening, balance, neck modalities/strengthening    PT Home Exercise Plan Access Code: [email or web address]   Consulted and Agree with Plan of Care Patient                     Patient will benefit from skilled therapeutic intervention in order to improve the following deficits and impairments:  Abnormal gait, Decreased balance, Decreased strength, Pain  Visit Diagnosis: Cervicalgia  Muscle weakness (generalized)     Problem List Patient Active Problem List   Diagnosis Date Noted   UTI (urinary tract infection) 08/03/2020   Chronic back pain    Hypertension    GERD (gastroesophageal reflux disease)    JLyndel SafeHuprich PT, DPT, GCS  Huprich,Jason 11/28/2020, 9:29 PM  [facility] [place] [address]Phone: [phone]  Fax:  [phone] Name: Marilyn HartsfieldMRN: [id number] of Birth: 819-Aug-1947

## 2020-11-30 ENCOUNTER — Ambulatory Visit: Payer: Medicare Other

## 2020-11-30 ENCOUNTER — Other Ambulatory Visit: Payer: Self-pay

## 2020-11-30 DIAGNOSIS — M542 Cervicalgia: Secondary | ICD-10-CM

## 2020-11-30 DIAGNOSIS — M6281 Muscle weakness (generalized): Secondary | ICD-10-CM

## 2020-11-30 DIAGNOSIS — R2689 Other abnormalities of gait and mobility: Secondary | ICD-10-CM

## 2020-11-30 NOTE — Therapy (Signed)
[facility] [address] [place], [place], [address] Phone: [phone]   Fax:  [phone]  Physical Therapy Treatment   Patient Details  Name: Marilyn Keith MRN: [id number] Date of Birth: 05/01/1945 Referring Provider (PT): Bubba Camp FNP   Encounter Date: 11/30/2020   PT End of Session - 11/30/20 0927     Visit Number 21    Number of Visits 25    Date for PT Re-Evaluation 02/07/21    Authorization Type Medicare (traditional); BCBS secondary    PT Start Time 0930    PT Stop Time 1015    PT Time Calculation (min) 45 min    Equipment Utilized During Treatment Gait belt    Activity Tolerance Patient tolerated treatment well    Behavior During Therapy WFL for tasks assessed/performed                 Past Medical History:  Diagnosis Date   Chronic back pain    GERD (gastroesophageal reflux disease)    Hypertension     Past Surgical History:  Procedure Laterality Date   BACK SURGERY     EYE SURGERY     MASTECTOMY     REPLACEMENT TOTAL KNEE BILATERAL     SHOULDER SURGERY Right     There were no vitals filed for this visit.    Subjective Assessment - 11/30/20 0926     Subjective Pt doing alright today.  She was sore yesterday because she had a modified Barium swallow yesterday so she was unable to take her Lyrica. She continues with her chronic bilateral neck, shoulder, and knee pain and her back pain remains slightly more aggravated today. No recent falls. No specific questions or concerns.    Pertinent History Pt complains of neck and bilateral shoulder pain for the last 10 years. "If I don't take my Lyrica it hurts so bad that I cry." She also complains of constant headaches on the crown of her head. She takes a lot of Excedrin for the headaches. She also complains of bilateral shoulder pain which is independent from her neck. History of 3 rotator cuffs on her R shoulder and is currently unable to lift  her R arm. She has been advised that she needs surgery for her shoulder but she is not interested in surgical intervention at this time. She has a history of chronic back pain and reports extensive spinal fusions. "I'm all fused except for my neck." She reports a remote cervical fusion as well but does not recall which level. She complains of chronic R hip pain which prevents her from laying on her R side. She has had bilateral TKR and states that this caused a leg length difference in her lower extremities. Currently she wears a lift in her R shoe. She has a lot of R knee instability and wears a brace on her R knee to prevent it from buckling. Pt has had two to three falls in the last 6 months and many "almost falls." She uses and single point cane in RUE when out in the community and uses a rollator at home. History of physical therapy after her bilateral TKR and her spinal fusions. She also had an evaluation for her gait/balance in [place] but due to [place] pandemic was unable to return for treatment. While living in [place] she was getting trigger point injections for her neck pain which were helpful however since moving to [facility] she has not had any injections.  She has had spinal injections in the past as well with very brief relief. History of R breast cancer s/p bilateral mastectomy. She has RUE lymphedema and pt has a home compression unit but she doesn't use it because she states that it doesn't help.    Patient Stated Goals Decrease neck pain, improve R knee stability/gait/balance               TREATMENT   Ther-ex  NuStep L2-3 x 10 minutes for warm-up during history, therapist monitoring and adjusting resistance (5 minutes unbilled); Seated bicep curls with 4# dumbbells x 10; Seated chest press with cane and resistance from therapist (increased resistance today) x 20; Seated L shoulder flexion with 2# dumbbell x 10; Seated L shoulder scaption with 2# dumbbell x 10,; Seated  rows with cane and resistance from therapist x 25; Seated tricep press down with cane and therapist providing resistance x 20;  Seated alternating LAQ with 5# AW x 1 minute; Seated clams x 1 minute, with blue tband; Seated adductor ball squeeze x 1 minute;    Manual Therapy  Light IASTM with long sweeping strokes to bilateral posterior cervical paraspinals, upper traps, and levator scapulae bilaterally, continued monitoring for patient response during intervention to assure no increase in pain.  Patient denies any increase in pain during manual techniques.   Pt educated throughout session about proper posture and technique with exercises. Improved exercise technique, movement at target joints, use of target muscles after min to mod verbal, visual, tactile cues.    Patient denies any increase in her neck pain with manual techniques after last session and no worsening of symptoms during session today. Utilized very light manual therapy today due to increase in resting back pain. Additional rest times allowed between exercises due to increased pain and fatigue. Pt advised to continue HEP and follow-up as scheduled.  We will continue to introduce additional manual techniques and strengthening for her neck as she is able to tolerate. She will benefit from PT services to address deficits in pain, strength, balance, and mobility in order to improve function at home and decreased risk for falls.              PT Short Term Goals - 11/28/20 1436       PT SHORT TERM GOAL #1   Title Pt will be independent with HEP in order to decrease neck pain and improve balance/gait to improve pain-free function at home and decrease risk for falls    Time 6    Period Weeks    Status On-going    Target Date 12/28/20               PT Long Term Goals - 11/28/20 1145       PT LONG TERM GOAL #1   Title Pt will demonstrate decrease in NDI by at least 19% in order to demonstrate clinically significant  reduction in disability related to neck pain    Baseline 08/30/20: 58%; 10/11/20: 64%;11/15/20: 42%    Time 12    Period Weeks    Status Partially Met    Target Date 02/07/21      PT LONG TERM GOAL #2   Title Pt will decrease worst neck pain as reported on NPRS by at least 2 points in order to demonstrate clinically significant reduction in back pain.    Baseline 08/30/20: worst 8/10; 10/11/20: 8/10; 11/15/20: 8/10;    Time 12    Period Weeks    Status  On-going    Target Date 02/07/21      PT LONG TERM GOAL #3   Title Pt will report at least 30% improvement in her neck symptoms in order to improve pain-free function at hom    Baseline 10/11/20: No change; 11/15/20: 5-6%    Time 12    Period Weeks    Status Partially Met    Target Date 02/07/21      PT LONG TERM GOAL #4   Title Pt will increase self-selected 10MWT to at least 0.6 m/s in order to demonstrate clinically significant improvement in household and community ambulation    Baseline 09/04/20: self-selected: 26.8s = 0.37 m/s; 10/11/20: 17.6s = 0.57 m/s; 11/15/20: 16.6s =  0.60ms    Time 8    Period Weeks    Status Revised    Target Date 02/07/21      PT LONG TERM GOAL #5   Title Pt will improve BERG to at least 49/56 in order to demonstrate clinically significant improvement in balance and lower risk for falls.    Baseline 09/04/20: 40/56; 10/11/20: 46/56; 11/15/20: 48/56    Time 8    Period Weeks    Status Partially Met    Target Date 02/07/21                   Plan - 11/30/20 0932     Clinical Impression Statement Patient denies any increase in her neck pain with manual techniques after last session and no worsening of symptoms during session today. Utilized very light manual therapy today due to increase in resting back pain. Additional rest times allowed between exercises due to increased pain and fatigue. Pt advised to continue HEP and follow-up as scheduled.  We will continue to introduce additional manual  techniques and strengthening for her neck as she is able to tolerate. She will benefit from PT services to address deficits in pain, strength, balance, and mobility in order to improve function at home and decreased risk for falls    Personal Factors and Comorbidities Age;Comorbidity 3+;Past/Current Experience;Time since onset of injury/illness/exacerbation    Comorbidities cervical fusion, lumbar fusion, HTN, chronic pain, TKR, R shoulder RTC tear s/p surgery    Examination-Activity Limitations Bend;Hygiene/Grooming;Dressing;Lift;Reach Overhead;Squat;Stairs;Stand    Examination-Participation Restrictions Cleaning;Community Activity;Laundry;Meal Prep;Shop    Stability/Clinical Decision Making Unstable/Unpredictable    Rehab Potential Fair    PT Frequency 2x / week    PT Duration 12 weeks    PT Treatment/Interventions ADLs/Self Care Home Management;Aquatic Therapy;Biofeedback;Canalith Repostioning;Cryotherapy;Electrical Stimulation;Iontophoresis 434mml Dexamethasone;Moist Heat;Traction;Ultrasound;DME Instruction;Gait training;Stair training;Functional mobility training;Therapeutic activities;Therapeutic exercise;Balance training;Neuromuscular re-education;Patient/family education;Manual techniques;Passive range of motion;Dry needling;Vestibular;Spinal Manipulations;Joint Manipulations    PT Next Visit Plan LE strengthening, balance, neck modalities/strengthening    PT Home Exercise Plan Access Code: [email or web address]  Consulted and Agree with Plan of Care Patient                     Patient will benefit from skilled therapeutic intervention in order to improve the following deficits and impairments:  Abnormal gait, Decreased balance, Decreased strength, Pain  Visit Diagnosis: Cervicalgia  Muscle weakness (generalized)  Other abnormalities of gait and mobility     Problem List Patient Active Problem List   Diagnosis Date Noted   UTI (urinary tract infection) 08/03/2020   Chronic  back pain    Hypertension    GERD (gastroesophageal reflux disease)    JaLyndel Safeuprich PT, DPT, GCS  Adalea Handler 11/30/2020, 12:17 PM  [facility] [address] [place], [place], [address] Phone: [phone]   Fax:  [phone]  Name: Marilyn Keith MRN: [id number] Date of Birth: [birth year]

## 2020-12-04 ENCOUNTER — Other Ambulatory Visit: Payer: Self-pay

## 2020-12-04 ENCOUNTER — Ambulatory Visit: Payer: Medicare Other

## 2020-12-04 DIAGNOSIS — M542 Cervicalgia: Secondary | ICD-10-CM

## 2020-12-04 DIAGNOSIS — R2689 Other abnormalities of gait and mobility: Secondary | ICD-10-CM

## 2020-12-04 DIAGNOSIS — M6281 Muscle weakness (generalized): Secondary | ICD-10-CM | POA: Diagnosis not present

## 2020-12-04 NOTE — Therapy (Signed)
[facility] [address] [place], [place], [address] Phone: [phone]   Fax:  [phone]  Physical Therapy Treatment   Patient Details  Name: Marilyn Keith MRN: [id number] Date of Birth: 05-13-1945 Referring Provider (PT): Bubba Camp FNP   Encounter Date: 12/04/2020   PT End of Session - 12/04/20 1209     Visit Number 22    Number of Visits 27    Date for PT Re-Evaluation 02/07/21    Authorization Type Medicare (traditional); BCBS secondary    PT Start Time 1150    PT Stop Time 1230    PT Time Calculation (min) 40 min    Equipment Utilized During Treatment Gait belt    Activity Tolerance Patient tolerated treatment well    Behavior During Therapy WFL for tasks assessed/performed                  Past Medical History:  Diagnosis Date   Chronic back pain    GERD (gastroesophageal reflux disease)    Hypertension     Past Surgical History:  Procedure Laterality Date   BACK SURGERY     EYE SURGERY     MASTECTOMY     REPLACEMENT TOTAL KNEE BILATERAL     SHOULDER SURGERY Right     There were no vitals filed for this visit.    Subjective Assessment - 12/04/20 1207     Subjective Pt doing alright today but is having a lot of soreness in her arms. She would like to bypass arm strengtheningt today. She continues with her chronic bilateral neck, shoulder, and knee pain. Her back pain has returned to it's normal pain. No recent falls. No specific questions or concerns.    Pertinent History Pt complains of neck and bilateral shoulder pain for the last 10 years. "If I don't take my Lyrica it hurts so bad that I cry." She also complains of constant headaches on the crown of her head. She takes a lot of Excedrin for the headaches. She also complains of bilateral shoulder pain which is independent from her neck. History of 3 rotator cuffs on her R shoulder and is currently unable to lift her R arm. She has been  advised that she needs surgery for her shoulder but she is not interested in surgical intervention at this time. She has a history of chronic back pain and reports extensive spinal fusions. "I'm all fused except for my neck." She reports a remote cervical fusion as well but does not recall which level. She complains of chronic R hip pain which prevents her from laying on her R side. She has had bilateral TKR and states that this caused a leg length difference in her lower extremities. Currently she wears a lift in her R shoe. She has a lot of R knee instability and wears a brace on her R knee to prevent it from buckling. Pt has had two to three falls in the last 6 months and many "almost falls." She uses and single point cane in RUE when out in the community and uses a rollator at home. History of physical therapy after her bilateral TKR and her spinal fusions. She also had an evaluation for her gait/balance in [place] but due to [place] pandemic was unable to return for treatment. While living in [place] she was getting trigger point injections for her neck pain which were helpful however since moving to [facility] she has not had any injections. She has  had spinal injections in the past as well with very brief relief. History of R breast cancer s/p bilateral mastectomy. She has RUE lymphedema and pt has a home compression unit but she doesn't use it because she states that it doesn't help.    Patient Stated Goals Decrease neck pain, improve R knee stability/gait/balance                TREATMENT   Ther-ex  NuStep L2 x 10 minutes for warm-up during history, therapist monitoring and adjusting resistance as needed (5 minutes unbilled);  Standing exercises with 5# ankle weights (AW): Hip flexion marches x 1 minute; Hip abduction x 1 minute; Hamstring curls x 1 minute; Hip extension x 1 minute;  Seated alternating LAQ with 5# AW x 1 minute; Seated clams x 1 minute, with blue  tband; Seated adductor ball squeeze x 1 minute;  Standing heel raises with BUE support x 20;    Manual Therapy  Light IASTM with long sweeping strokes to bilateral posterior cervical paraspinals, upper traps, and levator scapulae bilaterally, continued monitoring for patient response during intervention to assure no increase in pain.  Patient denies any increase in pain during manual techniques.   Pt educated throughout session about proper posture and technique with exercises. Improved exercise technique, movement at target joints, use of target muscles after min to mod verbal, visual, tactile cues.    Patient denies any increase in her neck pain with manual techniques after last session and no worsening of symptoms during session today. Utilized very light manual therapy today due to increase in resting back pain. Pt fatigues quicker today and requires additional rest time between exercises. Deferred UE strengthening due to increase in her resting arm soreness upon arrival. Pt advised to continue HEP and follow-up as scheduled.  We will continue to introduce additional manual techniques and strengthening for her neck as she is able to tolerate. She will benefit from PT services to address deficits in pain, strength, balance, and mobility in order to improve function at home and decreased risk for falls.              PT Short Term Goals - 11/28/20 1436       PT SHORT TERM GOAL #1   Title Pt will be independent with HEP in order to decrease neck pain and improve balance/gait to improve pain-free function at home and decrease risk for falls    Time 6    Period Weeks    Status On-going    Target Date 12/28/20               PT Long Term Goals - 11/28/20 1145       PT LONG TERM GOAL #1   Title Pt will demonstrate decrease in NDI by at least 19% in order to demonstrate clinically significant reduction in disability related to neck pain    Baseline 08/30/20: 58%; 10/11/20:  64%;11/15/20: 42%    Time 12    Period Weeks    Status Partially Met    Target Date 02/07/21      PT LONG TERM GOAL #2   Title Pt will decrease worst neck pain as reported on NPRS by at least 2 points in order to demonstrate clinically significant reduction in back pain.    Baseline 08/30/20: worst 8/10; 10/11/20: 8/10; 11/15/20: 8/10;    Time 12    Period Weeks    Status On-going    Target Date 02/07/21  PT LONG TERM GOAL #3   Title Pt will report at least 30% improvement in her neck symptoms in order to improve pain-free function at hom    Baseline 10/11/20: No change; 11/15/20: 5-6%    Time 12    Period Weeks    Status Partially Met    Target Date 02/07/21      PT LONG TERM GOAL #4   Title Pt will increase self-selected 10MWT to at least 0.6 m/s in order to demonstrate clinically significant improvement in household and community ambulation    Baseline 09/04/20: self-selected: 26.8s = 0.37 m/s; 10/11/20: 17.6s = 0.57 m/s; 11/15/20: 16.6s =  0.31ms    Time 8    Period Weeks    Status Revised    Target Date 02/07/21      PT LONG TERM GOAL #5   Title Pt will improve BERG to at least 49/56 in order to demonstrate clinically significant improvement in balance and lower risk for falls.    Baseline 09/04/20: 40/56; 10/11/20: 46/56; 11/15/20: 48/56    Time 8    Period Weeks    Status Partially Met    Target Date 02/07/21                   Plan - 12/04/20 1210     Clinical Impression Statement Patient denies any increase in her neck pain with manual techniques after last session and no worsening of symptoms during session today. Utilized very light manual therapy today due to increase in resting back pain. Pt fatigues quicker today and requires additional rest time between exercises. Deferred UE strengthening due to increase in her resting arm soreness upon arrival. Pt advised to continue HEP and follow-up as scheduled.  We will continue to introduce additional manual techniques  and strengthening for her neck as she is able to tolerate. She will benefit from PT services to address deficits in pain, strength, balance, and mobility in order to improve function at home and decreased risk for falls.    Personal Factors and Comorbidities Age;Comorbidity 3+;Past/Current Experience;Time since onset of injury/illness/exacerbation    Comorbidities cervical fusion, lumbar fusion, HTN, chronic pain, TKR, R shoulder RTC tear s/p surgery    Examination-Activity Limitations Bend;Hygiene/Grooming;Dressing;Lift;Reach Overhead;Squat;Stairs;Stand    Examination-Participation Restrictions Cleaning;Community Activity;Laundry;Meal Prep;Shop    Stability/Clinical Decision Making Unstable/Unpredictable    Rehab Potential Fair    PT Frequency 2x / week    PT Duration 12 weeks    PT Treatment/Interventions ADLs/Self Care Home Management;Aquatic Therapy;Biofeedback;Canalith Repostioning;Cryotherapy;Electrical Stimulation;Iontophoresis 465mml Dexamethasone;Moist Heat;Traction;Ultrasound;DME Instruction;Gait training;Stair training;Functional mobility training;Therapeutic activities;Therapeutic exercise;Balance training;Neuromuscular re-education;Patient/family education;Manual techniques;Passive range of motion;Dry needling;Vestibular;Spinal Manipulations;Joint Manipulations    PT Next Visit Plan LE strengthening, balance, neck modalities/strengthening    PT Home Exercise Plan Access Code: [email or web address]  Consulted and Agree with Plan of Care Patient                      Patient will benefit from skilled therapeutic intervention in order to improve the following deficits and impairments:  Abnormal gait, Decreased balance, Decreased strength, Pain  Visit Diagnosis: Cervicalgia  Muscle weakness (generalized)  Other abnormalities of gait and mobility     Problem List Patient Active Problem List   Diagnosis Date Noted   UTI (urinary tract infection) 08/03/2020   Chronic back pain     Hypertension    GERD (gastroesophageal reflux disease)    JaLyndel Safeuprich PT, DPT, GCS  Eder Macek 12/04/2020, 1:23 PM  [facility] [address] [place], [place], [address] Phone: [phone]   Fax:  [phone]  Name: Marilyn Keith MRN: [id number] Date of Birth: Nov 23, 1945

## 2020-12-11 ENCOUNTER — Ambulatory Visit: Payer: Medicare Other

## 2020-12-13 ENCOUNTER — Ambulatory Visit: Payer: Medicare Other | Attending: Family Medicine

## 2020-12-13 ENCOUNTER — Other Ambulatory Visit: Payer: Self-pay

## 2020-12-13 VITALS — BP 147/67 | HR 86

## 2020-12-13 DIAGNOSIS — M542 Cervicalgia: Secondary | ICD-10-CM | POA: Diagnosis present

## 2020-12-13 DIAGNOSIS — R2689 Other abnormalities of gait and mobility: Secondary | ICD-10-CM | POA: Insufficient documentation

## 2020-12-13 DIAGNOSIS — M6281 Muscle weakness (generalized): Secondary | ICD-10-CM | POA: Diagnosis not present

## 2020-12-13 NOTE — Therapy (Signed)
[facility] [address] [place], [place], [address] Phone: [phone]   Fax:  [phone]  Physical Therapy Treatment   Patient Details  Name: Marilyn Keith MRN: [id number] Date of Birth: 1945/12/21 Referring Provider (PT): Bubba Camp FNP   Encounter Date: 12/13/2020   PT End of Session - 12/13/20 1202     Visit Number 23    Number of Visits 105    Date for PT Re-Evaluation 02/07/21    Authorization Type Medicare (traditional); BCBS secondary    PT Start Time 1155    PT Stop Time 1230    PT Time Calculation (min) 35 min    Equipment Utilized During Treatment Gait belt    Activity Tolerance Patient tolerated treatment well    Behavior During Therapy WFL for tasks assessed/performed                  Past Medical History:  Diagnosis Date   Chronic back pain    GERD (gastroesophageal reflux disease)    Hypertension     Past Surgical History:  Procedure Laterality Date   BACK SURGERY     EYE SURGERY     MASTECTOMY     REPLACEMENT TOTAL KNEE BILATERAL     SHOULDER SURGERY Right     Vitals:   12/13/20 1158  BP: (!) 147/67  Pulse: 86  SpO2: 98%      Subjective Assessment - 12/13/20 1201     Subjective Pt doing alright today, no excessive soreness in the arms. She would like to reintroduce arm strengthening today. She continues with her chronic bilateral neck, shoulder, and knee pain but no worse today than normal. Overall she feels like her R arm is getting slightly stronger. No recent falls. No specific questions or concerns.    Pertinent History Pt complains of neck and bilateral shoulder pain for the last 10 years. "If I don't take my Lyrica it hurts so bad that I cry." She also complains of constant headaches on the crown of her head. She takes a lot of Excedrin for the headaches. She also complains of bilateral shoulder pain which is independent from her neck. History of 3 rotator cuffs on her R  shoulder and is currently unable to lift her R arm. She has been advised that she needs surgery for her shoulder but she is not interested in surgical intervention at this time. She has a history of chronic back pain and reports extensive spinal fusions. "I'm all fused except for my neck." She reports a remote cervical fusion as well but does not recall which level. She complains of chronic R hip pain which prevents her from laying on her R side. She has had bilateral TKR and states that this caused a leg length difference in her lower extremities. Currently she wears a lift in her R shoe. She has a lot of R knee instability and wears a brace on her R knee to prevent it from buckling. Pt has had two to three falls in the last 6 months and many "almost falls." She uses and single point cane in RUE when out in the community and uses a rollator at home. History of physical therapy after her bilateral TKR and her spinal fusions. She also had an evaluation for her gait/balance in [place] but due to [place] pandemic was unable to return for treatment. While living in [place] she was getting trigger point injections for her neck pain which were helpful however  since moving to [facility] she has not had any injections. She has had spinal injections in the past as well with very brief relief. History of R breast cancer s/p bilateral mastectomy. She has RUE lymphedema and pt has a home compression unit but she doesn't use it because she states that it doesn't help.    Patient Stated Goals Decrease neck pain, improve R knee stability/gait/balance                TREATMENT   Ther-ex  NuStep L2 x 10 minutes for warm-up during history, therapist monitoring and adjusting resistance as needed (5 minutes unbilled);  Standing exercises with 5# ankle weights (AW): Hip flexion marches x 1 minute; Hip abduction x 1 minute; Hamstring curls x 1 minute; Hip extension x 1 minute;  Seated alternating LAQ with 5#  AW x 1 minute; Seated adductor ball squeeze x 1 minute;  Seated bicep curls with 4# dumbbells x 10 BUE; Seated chest press with cane and resistance from therapist (increased resistance today) x 20; Seated rows with cane and resistance from therapist x 20; Seated tricep press down with cane and therapist providing resistance x 20;   Pt educated throughout session about proper posture and technique with exercises. Improved exercise technique, movement at target joints, use of target muscles after min to mod verbal, visual, tactile cues.    Pt arrived late so session abbreviated accordingly. She denies any increase in her neck pain during session and the soreness in her arms has resolved. Reintroduced UE strengthening during session today. Pt advised to continue HEP and follow-up as scheduled.  We will continue to introduce additional manual techniques and strengthening for her neck as she is able to tolerate. She will benefit from PT services to address deficits in pain, strength, balance, and mobility in order to improve function at home and decreased risk for falls.              PT Short Term Goals - 11/28/20 1436       PT SHORT TERM GOAL #1   Title Pt will be independent with HEP in order to decrease neck pain and improve balance/gait to improve pain-free function at home and decrease risk for falls    Time 6    Period Weeks    Status On-going    Target Date 12/28/20               PT Long Term Goals - 11/28/20 1145       PT LONG TERM GOAL #1   Title Pt will demonstrate decrease in NDI by at least 19% in order to demonstrate clinically significant reduction in disability related to neck pain    Baseline 08/30/20: 58%; 10/11/20: 64%;11/15/20: 42%    Time 12    Period Weeks    Status Partially Met    Target Date 02/07/21      PT LONG TERM GOAL #2   Title Pt will decrease worst neck pain as reported on NPRS by at least 2 points in order to demonstrate clinically  significant reduction in back pain.    Baseline 08/30/20: worst 8/10; 10/11/20: 8/10; 11/15/20: 8/10;    Time 12    Period Weeks    Status On-going    Target Date 02/07/21      PT LONG TERM GOAL #3   Title Pt will report at least 30% improvement in her neck symptoms in order to improve pain-free function at hom    Baseline  10/11/20: No change; 11/15/20: 5-6%    Time 12    Period Weeks    Status Partially Met    Target Date 02/07/21      PT LONG TERM GOAL #4   Title Pt will increase self-selected 10MWT to at least 0.6 m/s in order to demonstrate clinically significant improvement in household and community ambulation    Baseline 09/04/20: self-selected: 26.8s = 0.37 m/s; 10/11/20: 17.6s = 0.57 m/s; 11/15/20: 16.6s =  0.77ms    Time 8    Period Weeks    Status Revised    Target Date 02/07/21      PT LONG TERM GOAL #5   Title Pt will improve BERG to at least 49/56 in order to demonstrate clinically significant improvement in balance and lower risk for falls.    Baseline 09/04/20: 40/56; 10/11/20: 46/56; 11/15/20: 48/56    Time 8    Period Weeks    Status Partially Met    Target Date 02/07/21                   Plan - 12/13/20 1202     Clinical Impression Statement Pt arrived late so session abbreviated accordingly. She denies any increase in her neck pain during session and the soreness in her arms has resolved. Reintroduced UE strengthening during session today. Pt advised to continue HEP and follow-up as scheduled.  We will continue to introduce additional manual techniques and strengthening for her neck as she is able to tolerate. She will benefit from PT services to address deficits in pain, strength, balance, and mobility in order to improve function at home and decreased risk for falls.    Personal Factors and Comorbidities Age;Comorbidity 3+;Past/Current Experience;Time since onset of injury/illness/exacerbation    Comorbidities cervical fusion, lumbar fusion, HTN, chronic  pain, TKR, R shoulder RTC tear s/p surgery    Examination-Activity Limitations Bend;Hygiene/Grooming;Dressing;Lift;Reach Overhead;Squat;Stairs;Stand    Examination-Participation Restrictions Cleaning;Community Activity;Laundry;Meal Prep;Shop    Stability/Clinical Decision Making Unstable/Unpredictable    Rehab Potential Fair    PT Frequency 2x / week    PT Duration 12 weeks    PT Treatment/Interventions ADLs/Self Care Home Management;Aquatic Therapy;Biofeedback;Canalith Repostioning;Cryotherapy;Electrical Stimulation;Iontophoresis 415mml Dexamethasone;Moist Heat;Traction;Ultrasound;DME Instruction;Gait training;Stair training;Functional mobility training;Therapeutic activities;Therapeutic exercise;Balance training;Neuromuscular re-education;Patient/family education;Manual techniques;Passive range of motion;Dry needling;Vestibular;Spinal Manipulations;Joint Manipulations    PT Next Visit Plan LE strengthening, balance, neck modalities/strengthening    PT Home Exercise Plan Access Code: [email or web address]  Consulted and Agree with Plan of Care Patient                      Patient will benefit from skilled therapeutic intervention in order to improve the following deficits and impairments:  Abnormal gait, Decreased balance, Decreased strength, Pain  Visit Diagnosis: Muscle weakness (generalized)  Other abnormalities of gait and mobility  Cervicalgia     Problem List Patient Active Problem List   Diagnosis Date Noted   UTI (urinary tract infection) 08/03/2020   Chronic back pain    Hypertension    GERD (gastroesophageal reflux disease)    Marilyn Keith PT, DPT, GCS  Marilyn Keith 12/13/2020, 2:08 PM   [facility]MeBiscayNCAlaska[address]hone: [phone] Fax:  [phone]: Marilyn GatliffRN: [id number] of Birth: 8/March 26, 1945

## 2020-12-18 ENCOUNTER — Other Ambulatory Visit: Payer: Self-pay

## 2020-12-18 ENCOUNTER — Ambulatory Visit: Payer: Medicare Other | Admitting: Physical Therapy

## 2020-12-18 DIAGNOSIS — M6281 Muscle weakness (generalized): Secondary | ICD-10-CM | POA: Diagnosis not present

## 2020-12-18 DIAGNOSIS — R2689 Other abnormalities of gait and mobility: Secondary | ICD-10-CM

## 2020-12-18 DIAGNOSIS — M542 Cervicalgia: Secondary | ICD-10-CM

## 2020-12-18 NOTE — Therapy (Signed)
[facility] [address]. [place], [place], [address] Phone: [phone]   Fax:  [phone]  Physical Therapy Treatment  Patient Details  Name: Marilyn Keith MRN: [id number] Date of Birth: 02-29-1945 Referring Provider (PT): Bubba Camp FNP   Encounter Date: 12/18/2020   PT End of Session - 12/18/20 1303     Visit Number 24    Number of Visits 50    Date for PT Re-Evaluation 02/07/21    Authorization Type Medicare (traditional); BCBS secondary    PT Start Time 1149    PT Stop Time 1230    PT Time Calculation (min) 41 min    Equipment Utilized During Treatment Gait belt    Activity Tolerance Patient tolerated treatment well    Behavior During Therapy WFL for tasks assessed/performed             Past Medical History:  Diagnosis Date   Chronic back pain    GERD (gastroesophageal reflux disease)    Hypertension     Past Surgical History:  Procedure Laterality Date   BACK SURGERY     EYE SURGERY     MASTECTOMY     REPLACEMENT TOTAL KNEE BILATERAL     SHOULDER SURGERY Right     There were no vitals filed for this visit.   Subjective Assessment - 12/18/20 1152     Subjective Pt doing well today, no excessive soreness. She continues with her chronic bilateral neck, shoulder, and knee pain - reports it is at baseline level. Pt states she has been able to walk further in her neighborhood - she's now able to make it to the top of the hill. No recent falls. No questions or concerns.    Pertinent History Pt complains of neck and bilateral shoulder pain for the last 10 years. "If I don't take my Lyrica it hurts so bad that I cry." She also complains of constant headaches on the crown of her head. She takes a lot of Excedrin for the headaches. She also complains of bilateral shoulder pain which is independent from her neck. History of 3 rotator cuffs on her R shoulder and is currently unable to lift her R arm. She has  been advised that she needs surgery for her shoulder but she is not interested in surgical intervention at this time. She has a history of chronic back pain and reports extensive spinal fusions. "I'm all fused except for my neck." She reports a remote cervical fusion as well but does not recall which level. She complains of chronic R hip pain which prevents her from laying on her R side. She has had bilateral TKR and states that this caused a leg length difference in her lower extremities. Currently she wears a lift in her R shoe. She has a lot of R knee instability and wears a brace on her R knee to prevent it from buckling. Pt has had two to three falls in the last 6 months and many "almost falls." She uses and single point cane in RUE when out in the community and uses a rollator at home. History of physical therapy after her bilateral TKR and her spinal fusions. She also had an evaluation for her gait/balance in [place] but due to [place] pandemic was unable to return for treatment. While living in [place] she was getting trigger point injections for her neck pain which were helpful however since moving to [facility] she has not had any injections. She has  had spinal injections in the past as well with very brief relief. History of R breast cancer s/p bilateral mastectomy. She has RUE lymphedema and pt has a home compression unit but she doesn't use it because she states that it doesn't help.    Patient Stated Goals Decrease neck pain, improve R knee stability/gait/balance                  TREATMENT     Ther-ex  NuStep L2 x 10 minutes for warm-up during history, therapist monitoring and adjusting resistance as needed (5 minutes unbilled);   Standing exercises with 5# ankle weights (AW): Hip flexion marches x 1 minute; Hip abduction x 1 minute; Hamstring curls x 1 minute; Hip extension x 1 minute;   Seated alternating LAQ with 5# AW x 1 minute; Seated adductor ball squeeze x 1  minute; Seated hip abduction GTB  x 1 minute;   Seated bicep curls with 4# dumbbells x 10 BUE; Seated chest press with cane and resistance from therapist x 20; Seated rows with GTB x 20;    Manual Therapy  Light STM with long sweeping strokes to bilateral posterior cervical paraspinals, upper traps, levator scapulae and rhomboids bilaterally. Frequent monitoring for patient response during intervention to assure no increase in pain.  TP noted in L rhomboid and R upper trap.    Pt educated throughout session about proper posture and technique with exercises. Improved exercise technique, movement at target joints, use of target muscles after min to mod verbal, visual, tactile cues.      Patient demonstrated excellent motivation throughout session. Continued LE and UE strengthening exercises. She denied any increase in neck pain within session. PT performed STM to posterior cervical region and upper back - upon completion, resolution of TP in R upper trap however L rhomboid remains hypersensitive. Pt advised to continue HEP and follow-up as scheduled.  Additional manual techniques and cervical strengthening will be introduced as pt is able to tolerate. She will benefit from PT services to address deficits in pain, strength, balance, and mobility in order to improve function at home and decreased risk for falls.                 PT Short Term Goals - 11/28/20 1436       PT SHORT TERM GOAL #1   Title Pt will be independent with HEP in order to decrease neck pain and improve balance/gait to improve pain-free function at home and decrease risk for falls    Time 6    Period Weeks    Status On-going    Target Date 12/28/20               PT Long Term Goals - 11/28/20 1145       PT LONG TERM GOAL #1   Title Pt will demonstrate decrease in NDI by at least 19% in order to demonstrate clinically significant reduction in disability related to neck pain    Baseline 08/30/20: 58%;  10/11/20: 64%;11/15/20: 42%    Time 12    Period Weeks    Status Partially Met    Target Date 02/07/21      PT LONG TERM GOAL #2   Title Pt will decrease worst neck pain as reported on NPRS by at least 2 points in order to demonstrate clinically significant reduction in back pain.    Baseline 08/30/20: worst 8/10; 10/11/20: 8/10; 11/15/20: 8/10;    Time 12    Period Weeks  Status On-going    Target Date 02/07/21      PT LONG TERM GOAL #3   Title Pt will report at least 30% improvement in her neck symptoms in order to improve pain-free function at hom    Baseline 10/11/20: No change; 11/15/20: 5-6%    Time 12    Period Weeks    Status Partially Met    Target Date 02/07/21      PT LONG TERM GOAL #4   Title Pt will increase self-selected 10MWT to at least 0.6 m/s in order to demonstrate clinically significant improvement in household and community ambulation    Baseline 09/04/20: self-selected: 26.8s = 0.37 m/s; 10/11/20: 17.6s = 0.57 m/s; 11/15/20: 16.6s =  0.16ms    Time 8    Period Weeks    Status Revised    Target Date 02/07/21      PT LONG TERM GOAL #5   Title Pt will improve BERG to at least 49/56 in order to demonstrate clinically significant improvement in balance and lower risk for falls.    Baseline 09/04/20: 40/56; 10/11/20: 46/56; 11/15/20: 48/56    Time 8    Period Weeks    Status Partially Met    Target Date 02/07/21                   Plan - 12/18/20 1422     Clinical Impression Statement Patient demonstrated excellent motivation throughout session. Continued LE and UE strengthening exercises. She denied any increase in neck pain within session. PT performed STM to posterior cervical region and upper back - upon completion, resolution of TP in R upper trap however L rhomboid remains hypersensitive. Pt advised to continue HEP and follow-up as scheduled.  Additional manual techniques and cervical strengthening will be introduced as pt is able to tolerate. She will  benefit from PT services to address deficits in pain, strength, balance, and mobility in order to improve function at home and decreased risk for falls.    Personal Factors and Comorbidities Age;Comorbidity 3+;Past/Current Experience;Time since onset of injury/illness/exacerbation    Comorbidities cervical fusion, lumbar fusion, HTN, chronic pain, TKR, R shoulder RTC tear s/p surgery    Examination-Activity Limitations Bend;Hygiene/Grooming;Dressing;Lift;Reach Overhead;Squat;Stairs;Stand    Examination-Participation Restrictions Cleaning;Community Activity;Laundry;Meal Prep;Shop    Stability/Clinical Decision Making Unstable/Unpredictable    Rehab Potential Fair    PT Frequency 2x / week    PT Duration 12 weeks    PT Treatment/Interventions ADLs/Self Care Home Management;Aquatic Therapy;Biofeedback;Canalith Repostioning;Cryotherapy;Electrical Stimulation;Iontophoresis 425mml Dexamethasone;Moist Heat;Traction;Ultrasound;DME Instruction;Gait training;Stair training;Functional mobility training;Therapeutic activities;Therapeutic exercise;Balance training;Neuromuscular re-education;Patient/family education;Manual techniques;Passive range of motion;Dry needling;Vestibular;Spinal Manipulations;Joint Manipulations    PT Next Visit Plan LE strengthening, balance, neck modalities/strengthening    PT Home Exercise Plan Access Code: [email or web address]  Consulted and Agree with Plan of Care Patient             Patient will benefit from skilled therapeutic intervention in order to improve the following deficits and impairments:  Abnormal gait, Decreased balance, Decreased strength, Pain  Visit Diagnosis: Muscle weakness (generalized)  Other abnormalities of gait and mobility  Cervicalgia     Problem List Patient Active Problem List   Diagnosis Date Noted   UTI (urinary tract infection) 08/03/2020   Chronic back pain    Hypertension    GERD (gastroesophageal reflux disease)     KePatrina LeveringT,  DPT  [facility] [facility]one: [phone]  Fax:  [phone]  Name: Marilyn Keith MRN: [id number] Date of Birth: May 05, 1945

## 2020-12-20 ENCOUNTER — Other Ambulatory Visit: Payer: Self-pay

## 2020-12-20 ENCOUNTER — Ambulatory Visit: Payer: Medicare Other

## 2020-12-20 DIAGNOSIS — M6281 Muscle weakness (generalized): Secondary | ICD-10-CM

## 2020-12-20 DIAGNOSIS — R2689 Other abnormalities of gait and mobility: Secondary | ICD-10-CM

## 2020-12-20 DIAGNOSIS — M542 Cervicalgia: Secondary | ICD-10-CM

## 2020-12-20 NOTE — Therapy (Signed)
[facility] [address] [place], [place], [address] Phone: [phone]   Fax:  [phone]  Physical Therapy Treatment   Patient Details  Name: Marilyn Keith MRN: [id number] Date of Birth: 09-Feb-1945 Referring Provider (PT): Bubba Camp FNP   Encounter Date: 12/20/2020   PT End of Session - 12/20/20 1200     Visit Number 25    Number of Visits 49    Date for PT Re-Evaluation 02/07/21    Authorization Type Medicare (traditional); BCBS secondary    PT Start Time 1150    PT Stop Time 1230    PT Time Calculation (min) 40 min    Equipment Utilized During Treatment Gait belt    Activity Tolerance Patient tolerated treatment well    Behavior During Therapy WFL for tasks assessed/performed                  Past Medical History:  Diagnosis Date   Chronic back pain    GERD (gastroesophageal reflux disease)    Hypertension     Past Surgical History:  Procedure Laterality Date   BACK SURGERY     EYE SURGERY     MASTECTOMY     REPLACEMENT TOTAL KNEE BILATERAL     SHOULDER SURGERY Right     There were no vitals filed for this visit.     Subjective Assessment - 12/20/20 1159     Subjective Pt doing well today, no excessive soreness. She continues with her chronic bilateral neck, shoulder, and knee pain which was worse this morning but has improved since that time. No recent falls. No questions or concerns.    Pertinent History Pt complains of neck and bilateral shoulder pain for the last 10 years. "If I don't take my Lyrica it hurts so bad that I cry." She also complains of constant headaches on the crown of her head. She takes a lot of Excedrin for the headaches. She also complains of bilateral shoulder pain which is independent from her neck. History of 3 rotator cuffs on her R shoulder and is currently unable to lift her R arm. She has been advised that she needs surgery for her shoulder but she is not  interested in surgical intervention at this time. She has a history of chronic back pain and reports extensive spinal fusions. "I'm all fused except for my neck." She reports a remote cervical fusion as well but does not recall which level. She complains of chronic R hip pain which prevents her from laying on her R side. She has had bilateral TKR and states that this caused a leg length difference in her lower extremities. Currently she wears a lift in her R shoe. She has a lot of R knee instability and wears a brace on her R knee to prevent it from buckling. Pt has had two to three falls in the last 6 months and many "almost falls." She uses and single point cane in RUE when out in the community and uses a rollator at home. History of physical therapy after her bilateral TKR and her spinal fusions. She also had an evaluation for her gait/balance in [place] but due to [place] pandemic was unable to return for treatment. While living in [place] she was getting trigger point injections for her neck pain which were helpful however since moving to [facility] she has not had any injections. She has had spinal injections in the past as well with very brief relief. History  of R breast cancer s/p bilateral mastectomy. She has RUE lymphedema and pt has a home compression unit but she doesn't use it because she states that it doesn't help.    Patient Stated Goals Decrease neck pain, improve R knee stability/gait/balance                TREATMENT   Ther-ex  NuStep L2 x 10 minutes for warm-up during history, therapist monitoring and adjusting resistance as needed (5 minutes unbilled);  Standing exercises with 5# ankle weights (AW): Hip flexion marches x 1 minute; Hip abduction x 1 minute; Hamstring curls x 1 minute;  Seated alternating LAQ with 5# AW x 1 minute; Seated adductor ball squeeze x 1 minute; Seated adductor ball squeeze x 1 minute;  Seated bicep curls with 4# dumbbells x 10 BUE  (attempted to increase to 5# but too much strain on neck); Seated chest press with cane and resistance from therapist (increased resistance today) x 20; Seated rows with cane and resistance from therapist x 20; Seated tricep press down with cane and therapist providing resistance x 20;   Manual Therapy  Light IASTM with long sweeping strokes to bilateral posterior cervical paraspinals, upper traps, and levator scapulae bilaterally, continued monitoring for patient response during intervention to assure no increase in pain.  Patient denies any increase in pain during manual techniques.   Pt educated throughout session about proper posture and technique with exercises. Improved exercise technique, movement at target joints, use of target muscles after min to mod verbal, visual, tactile cues.    Attempt to increase dumbbells for seated bicep curls however this places to greater strain on patient's neck so regressed back to 4 pound dumbbells.  Otherwise, patient denies any increase in her neck pain during session.  Continued with UE strengthening during session today.  Patient does appear slightly more fatigued today during exercise so provided extended rest breaks.  Overall patient making great progress towards her goals.  Pt advised to continue HEP and follow-up as scheduled.  We will continue to introduce additional manual techniques and strengthening for her neck as she is able to tolerate. She will benefit from PT services to address deficits in pain, strength, balance, and mobility in order to improve function at home and decreased risk for falls.              PT Short Term Goals - 11/28/20 1436       PT SHORT TERM GOAL #1   Title Pt will be independent with HEP in order to decrease neck pain and improve balance/gait to improve pain-free function at home and decrease risk for falls    Time 6    Period Weeks    Status On-going    Target Date 12/28/20               PT Long Term  Goals - 11/28/20 1145       PT LONG TERM GOAL #1   Title Pt will demonstrate decrease in NDI by at least 19% in order to demonstrate clinically significant reduction in disability related to neck pain    Baseline 08/30/20: 58%; 10/11/20: 64%;11/15/20: 42%    Time 12    Period Weeks    Status Partially Met    Target Date 02/07/21      PT LONG TERM GOAL #2   Title Pt will decrease worst neck pain as reported on NPRS by at least 2 points in order to demonstrate clinically significant reduction in  back pain.    Baseline 08/30/20: worst 8/10; 10/11/20: 8/10; 11/15/20: 8/10;    Time 12    Period Weeks    Status On-going    Target Date 02/07/21      PT LONG TERM GOAL #3   Title Pt will report at least 30% improvement in her neck symptoms in order to improve pain-free function at hom    Baseline 10/11/20: No change; 11/15/20: 5-6%    Time 12    Period Weeks    Status Partially Met    Target Date 02/07/21      PT LONG TERM GOAL #4   Title Pt will increase self-selected 10MWT to at least 0.6 m/s in order to demonstrate clinically significant improvement in household and community ambulation    Baseline 09/04/20: self-selected: 26.8s = 0.37 m/s; 10/11/20: 17.6s = 0.57 m/s; 11/15/20: 16.6s =  0.36ms    Time 8    Period Weeks    Status Revised    Target Date 02/07/21      PT LONG TERM GOAL #5   Title Pt will improve BERG to at least 49/56 in order to demonstrate clinically significant improvement in balance and lower risk for falls.    Baseline 09/04/20: 40/56; 10/11/20: 46/56; 11/15/20: 48/56    Time 8    Period Weeks    Status Partially Met    Target Date 02/07/21                   Plan - 12/20/20 1200     Clinical Impression Statement Attempt to increase dumbbells for seated bicep curls however this places to greater strain on patient's neck so regressed back to 4 pound dumbbells.  Otherwise, patient denies any increase in her neck pain during session.  Continued with UE  strengthening during session today.  Patient does appear slightly more fatigued today during exercise so provided extended rest breaks.  Overall patient making great progress towards her goals.  Pt advised to continue HEP and follow-up as scheduled.  We will continue to introduce additional manual techniques and strengthening for her neck as she is able to tolerate. She will benefit from PT services to address deficits in pain, strength, balance, and mobility in order to improve function at home and decreased risk for falls.    Personal Factors and Comorbidities Age;Comorbidity 3+;Past/Current Experience;Time since onset of injury/illness/exacerbation    Comorbidities cervical fusion, lumbar fusion, HTN, chronic pain, TKR, R shoulder RTC tear s/p surgery    Examination-Activity Limitations Bend;Hygiene/Grooming;Dressing;Lift;Reach Overhead;Squat;Stairs;Stand    Examination-Participation Restrictions Cleaning;Community Activity;Laundry;Meal Prep;Shop    Stability/Clinical Decision Making Unstable/Unpredictable    Rehab Potential Fair    PT Frequency 2x / week    PT Duration 12 weeks    PT Treatment/Interventions ADLs/Self Care Home Management;Aquatic Therapy;Biofeedback;Canalith Repostioning;Cryotherapy;Electrical Stimulation;Iontophoresis 448mml Dexamethasone;Moist Heat;Traction;Ultrasound;DME Instruction;Gait training;Stair training;Functional mobility training;Therapeutic activities;Therapeutic exercise;Balance training;Neuromuscular re-education;Patient/family education;Manual techniques;Passive range of motion;Dry needling;Vestibular;Spinal Manipulations;Joint Manipulations    PT Next Visit Plan LE strengthening, balance, neck modalities/strengthening    PT Home Exercise Plan Access Code: [email or web address]  Consulted and Agree with Plan of Care Patient                      Patient will benefit from skilled therapeutic intervention in order to improve the following deficits and impairments:   Abnormal gait, Decreased balance, Decreased strength, Pain  Visit Diagnosis: Muscle weakness (generalized)  Other abnormalities of gait and mobility  Cervicalgia  Problem List Patient Active Problem List   Diagnosis Date Noted   UTI (urinary tract infection) 08/03/2020   Chronic back pain    Hypertension    GERD (gastroesophageal reflux disease)    Lyndel Safe Davied Nocito PT, DPT, GCS  Katrianna Friesenhahn 12/21/2020, 8:42 AM  [facility] [address] [place], [place], [address] Phone: [phone]   Fax:  [phone]  Name: Sejla Marzano MRN: [id number] Date of Birth: 11/09/1945

## 2020-12-25 ENCOUNTER — Ambulatory Visit: Payer: Medicare Other

## 2020-12-25 ENCOUNTER — Other Ambulatory Visit: Payer: Self-pay

## 2020-12-25 DIAGNOSIS — R2689 Other abnormalities of gait and mobility: Secondary | ICD-10-CM

## 2020-12-25 DIAGNOSIS — M6281 Muscle weakness (generalized): Secondary | ICD-10-CM

## 2020-12-25 DIAGNOSIS — M542 Cervicalgia: Secondary | ICD-10-CM

## 2020-12-25 NOTE — Therapy (Signed)
[facility] [address]. [place], [place], [address] Phone: [phone]   Fax:  [phone]  Physical Therapy Treatment   Patient Details  Name: Marilyn Keith MRN: [id number] Date of Birth: January 17, 1945 Referring Provider (PT): Bubba Camp FNP   Encounter Date: 12/25/2020   PT End of Session - 12/25/20 1108     Visit Number 26    Number of Visits 36    Date for PT Re-Evaluation 02/07/21    Authorization Type Medicare (traditional); BCBS secondary    PT Start Time 1100    PT Stop Time 1145    PT Time Calculation (min) 45 min    Equipment Utilized During Treatment Gait belt    Activity Tolerance Patient tolerated treatment well    Behavior During Therapy WFL for tasks assessed/performed                  Past Medical History:  Diagnosis Date   Chronic back pain    GERD (gastroesophageal reflux disease)    Hypertension     Past Surgical History:  Procedure Laterality Date   BACK SURGERY     EYE SURGERY     MASTECTOMY     REPLACEMENT TOTAL KNEE BILATERAL     SHOULDER SURGERY Right     There were no vitals filed for this visit.     Subjective Assessment - 12/25/20 1103     Subjective Pt doing alright today but has been having increased medial R knee pain. On Sunday her R knee "went out of joint" when she was kneeling and looking under her bed. She states that her tibia went medial. No swelling in the R knee and pt has been able to ambulate and bear weight. She continues with her chronic bilateral neck, shoulder, and knee pain. She has noticed that both hips have been more painful since her R knee injury presumably from a change in her gait related ot her R knee pain. No recent falls. No specific questions.    Pertinent History Pt complains of neck and bilateral shoulder pain for the last 10 years. "If I don't take my Lyrica it hurts so bad that I cry." She also complains of constant headaches on the crown  of her head. She takes a lot of Excedrin for the headaches. She also complains of bilateral shoulder pain which is independent from her neck. History of 3 rotator cuffs on her R shoulder and is currently unable to lift her R arm. She has been advised that she needs surgery for her shoulder but she is not interested in surgical intervention at this time. She has a history of chronic back pain and reports extensive spinal fusions. "I'm all fused except for my neck." She reports a remote cervical fusion as well but does not recall which level. She complains of chronic R hip pain which prevents her from laying on her R side. She has had bilateral TKR and states that this caused a leg length difference in her lower extremities. Currently she wears a lift in her R shoe. She has a lot of R knee instability and wears a brace on her R knee to prevent it from buckling. Pt has had two to three falls in the last 6 months and many "almost falls." She uses and single point cane in RUE when out in the community and uses a rollator at home. History of physical therapy after her bilateral TKR and her spinal fusions. She also  had an evaluation for her gait/balance in [place] but due to [place] pandemic was unable to return for treatment. While living in [place] she was getting trigger point injections for her neck pain which were helpful however since moving to [facility] she has not had any injections. She has had spinal injections in the past as well with very brief relief. History of R breast cancer s/p bilateral mastectomy. She has RUE lymphedema and pt has a home compression unit but she doesn't use it because she states that it doesn't help.    Patient Stated Goals Decrease neck pain, improve R knee stability/gait/balance                TREATMENT   Ther-ex  NuStep L2 x 10 minutes for warm-up during history BLE, therapist monitoring and adjusting resistance as needed (5 minutes unbilled);  Seated bicep  curls with 4# dumbbells x 13 BUE; Seated chest press with cane and resistance from therapist (increased resistance today) x 20; Seated rows with cane and resistance from therapist x 20; Seated tricep press down with cane and therapist providing resistance x 20; Seated L shoulder flexion with 1# dumbbell x 20;  Seated marches with 5# ankle weight x 1 minute; Seated adductor ball squeeze x 1 minute; Seated clams with green tband x 1 minute;   Manual Therapy  Light IASTM with long sweeping strokes to bilateral posterior cervical paraspinals, upper traps, and levator scapulae bilaterally, continued monitoring for patient response during intervention to assure no increase in pain.  Patient denies any increase in pain during manual techniques. Seated bilateral upper trap and lateral flexion stretches x 30s each bilaterally;   Pt educated throughout session about proper posture and technique with exercises. Improved exercise technique, movement at target joints, use of target muscles after min to mod verbal, visual, tactile cues.    Pt demonstrates excellent motivation during his session today. She denies any increase in her neck pain during session.  Continued with UE strengthening during session today. She is able to increase the repetitions of her bicep curls. Avoided any WB exercises for LEs due to increase in resting R knee pain since Sunday. Overall patient making great progress towards her goals.  Pt advised to continue HEP and follow-up as scheduled.  We will continue to introduce additional manual techniques and strengthening for her neck as she is able to tolerate. She will benefit from PT services to address deficits in pain, strength, balance, and mobility in order to improve function at home and decreased risk for falls.              PT Short Term Goals - 11/28/20 1436       PT SHORT TERM GOAL #1   Title Pt will be independent with HEP in order to decrease neck pain and improve  balance/gait to improve pain-free function at home and decrease risk for falls    Time 6    Period Weeks    Status On-going    Target Date 12/28/20               PT Long Term Goals - 11/28/20 1145       PT LONG TERM GOAL #1   Title Pt will demonstrate decrease in NDI by at least 19% in order to demonstrate clinically significant reduction in disability related to neck pain    Baseline 08/30/20: 58%; 10/11/20: 64%;11/15/20: 42%    Time 12    Period Weeks    Status  Partially Met    Target Date 02/07/21      PT LONG TERM GOAL #2   Title Pt will decrease worst neck pain as reported on NPRS by at least 2 points in order to demonstrate clinically significant reduction in back pain.    Baseline 08/30/20: worst 8/10; 10/11/20: 8/10; 11/15/20: 8/10;    Time 12    Period Weeks    Status On-going    Target Date 02/07/21      PT LONG TERM GOAL #3   Title Pt will report at least 30% improvement in her neck symptoms in order to improve pain-free function at hom    Baseline 10/11/20: No change; 11/15/20: 5-6%    Time 12    Period Weeks    Status Partially Met    Target Date 02/07/21      PT LONG TERM GOAL #4   Title Pt will increase self-selected 10MWT to at least 0.6 m/s in order to demonstrate clinically significant improvement in household and community ambulation    Baseline 09/04/20: self-selected: 26.8s = 0.37 m/s; 10/11/20: 17.6s = 0.57 m/s; 11/15/20: 16.6s =  0.54ms    Time 8    Period Weeks    Status Revised    Target Date 02/07/21      PT LONG TERM GOAL #5   Title Pt will improve BERG to at least 49/56 in order to demonstrate clinically significant improvement in balance and lower risk for falls.    Baseline 09/04/20: 40/56; 10/11/20: 46/56; 11/15/20: 48/56    Time 8    Period Weeks    Status Partially Met    Target Date 02/07/21                   Plan - 12/25/20 1110     Clinical Impression Statement Pt demonstrates excellent motivation during his session today.  She denies any increase in her neck pain during session.  Continued with UE strengthening during session today. She is able to increase the repetitions of her bicep curls. Avoided any WB exercises for LEs due to increase in resting R knee pain since Sunday. Overall patient making great progress towards her goals.  Pt advised to continue HEP and follow-up as scheduled.  We will continue to introduce additional manual techniques and strengthening for her neck as she is able to tolerate. She will benefit from PT services to address deficits in pain, strength, balance, and mobility in order to improve function at home and decreased risk for falls.    Personal Factors and Comorbidities Age;Comorbidity 3+;Past/Current Experience;Time since onset of injury/illness/exacerbation    Comorbidities cervical fusion, lumbar fusion, HTN, chronic pain, TKR, R shoulder RTC tear s/p surgery    Examination-Activity Limitations Bend;Hygiene/Grooming;Dressing;Lift;Reach Overhead;Squat;Stairs;Stand    Examination-Participation Restrictions Cleaning;Community Activity;Laundry;Meal Prep;Shop    Stability/Clinical Decision Making Unstable/Unpredictable    Rehab Potential Fair    PT Frequency 2x / week    PT Duration 12 weeks    PT Treatment/Interventions ADLs/Self Care Home Management;Aquatic Therapy;Biofeedback;Canalith Repostioning;Cryotherapy;Electrical Stimulation;Iontophoresis 489mml Dexamethasone;Moist Heat;Traction;Ultrasound;DME Instruction;Gait training;Stair training;Functional mobility training;Therapeutic activities;Therapeutic exercise;Balance training;Neuromuscular re-education;Patient/family education;Manual techniques;Passive range of motion;Dry needling;Vestibular;Spinal Manipulations;Joint Manipulations    PT Next Visit Plan LE strengthening, balance, neck modalities/strengthening    PT Home Exercise Plan Access Code: [email or web address]  Consulted and Agree with Plan of Care Patient                       Patient will benefit from skilled therapeutic  intervention in order to improve the following deficits and impairments:  Abnormal gait, Decreased balance, Decreased strength, Pain  Visit Diagnosis: Muscle weakness (generalized)  Other abnormalities of gait and mobility  Cervicalgia     Problem List Patient Active Problem List   Diagnosis Date Noted   UTI (urinary tract infection) 08/03/2020   Chronic back pain    Hypertension    GERD (gastroesophageal reflux disease)    Marilyn Keith PT, DPT, GCS  Marilyn Keith 12/25/2020, 2:23 PM  [facility] [address]. [place], [place], [address] Phone: [phone]   Fax:  [phone]  Name: Marilyn Keith MRN: [id number] Date of Birth: [birth year]

## 2021-01-01 ENCOUNTER — Ambulatory Visit: Payer: Medicare Other

## 2021-01-01 ENCOUNTER — Other Ambulatory Visit: Payer: Self-pay

## 2021-01-01 DIAGNOSIS — R2689 Other abnormalities of gait and mobility: Secondary | ICD-10-CM

## 2021-01-01 DIAGNOSIS — M6281 Muscle weakness (generalized): Secondary | ICD-10-CM

## 2021-01-01 DIAGNOSIS — M542 Cervicalgia: Secondary | ICD-10-CM

## 2021-01-01 NOTE — Therapy (Signed)
[facility] [address] [place], [place], [address] Phone: [phone]   Fax:  [phone]  Physical Therapy Treatment  Patient Details  Name: Marilyn Keith MRN: [id number] Date of Birth: 1945-07-23 Referring Provider (PT): Bubba Camp FNP   Encounter Date: 01/01/2021   PT End of Session - 01/01/21 1111     Visit Number 27    Number of Visits 60    Date for PT Re-Evaluation 02/07/21    Authorization Type Medicare (traditional); BCBS secondary    Authorization Time Period 11/15/20-02/07/21    Progress Note Due on Visit 30    PT Start Time 1100    PT Stop Time 1140    PT Time Calculation (min) 40 min    Equipment Utilized During Treatment Gait belt    Activity Tolerance Patient tolerated treatment well    Behavior During Therapy WFL for tasks assessed/performed             Past Medical History:  Diagnosis Date   Chronic back pain    GERD (gastroesophageal reflux disease)    Hypertension     Past Surgical History:  Procedure Laterality Date   BACK SURGERY     EYE SURGERY     MASTECTOMY     REPLACEMENT TOTAL KNEE BILATERAL     SHOULDER SURGERY Right     There were no vitals filed for this visit.   Subjective Assessment - 01/01/21 1106     Subjective Pt doing ok today, lots of pain all over, but no HA. Her right knee still hurts since popping episode on prior Sunday.    Pertinent History Pt complains of neck and bilateral shoulder pain for the last 10 years. "If I don't take my Lyrica it hurts so bad that I cry." She also complains of constant headaches on the crown of her head. She takes a lot of Excedrin for the headaches. She also complains of bilateral shoulder pain which is independent from her neck. History of 3 rotator cuffs on her R shoulder and is currently unable to lift her R arm. She has been advised that she needs surgery for her shoulder but she is not interested in surgical intervention at this  time. She has a history of chronic back pain and reports extensive spinal fusions. "I'm all fused except for my neck." She reports a remote cervical fusion as well but does not recall which level. She complains of chronic R hip pain which prevents her from laying on her R side. She has had bilateral TKR and states that this caused a leg length difference in her lower extremities. Currently she wears a lift in her R shoe. She has a lot of R knee instability and wears a brace on her R knee to prevent it from buckling. Pt has had two to three falls in the last 6 months and many "almost falls." She uses and single point cane in RUE when out in the community and uses a rollator at home. History of physical therapy after her bilateral TKR and her spinal fusions. She also had an evaluation for her gait/balance in [place] but due to [place] pandemic was unable to return for treatment. While living in [place] she was getting trigger point injections for her neck pain which were helpful however since moving to [facility] she has not had any injections. She has had spinal injections in the past as well with very brief relief. History of R breast cancer s/p  bilateral mastectomy. She has RUE lymphedema and pt has a home compression unit but she doesn't use it because she states that it doesn't help.    Currently in Pain? Yes    Pain Score --   pain in many places; Rt knee, Right low back, fingers, shoulders, neck.            INTERVENTION:  -Nustep x10 mintes levels 2-3 (lumbar hotpack) -Seated LAQ 1x15 0lb (cervical hotpack)  -Seated LAQ 1x15 5lb -Seated marches with 5# ankle weight x 1 minute; -seated isometric clam, gait belt resistance 1x15x5secH  -seated isometric hip extension into 2 pillows 1x15 bilat    -seated triceps extension on Freedom Trainer 20lbs seated in chair 1x20  -seated chair Freedom Training rows pronated 20lb 1x10, chest press 20lb 1x20  *unable to get to elbow flexion or  shoulder flexion for time         PT Education - 01/01/21 1110     Education Details pain management strategies    Person(s) Educated Patient    Methods Explanation;Demonstration    Comprehension Verbalized understanding              PT Short Term Goals - 11/28/20 1436       PT SHORT TERM GOAL #1   Title Pt will be independent with HEP in order to decrease neck pain and improve balance/gait to improve pain-free function at home and decrease risk for falls    Time 6    Period Weeks    Status On-going    Target Date 12/28/20               PT Long Term Goals - 11/28/20 1145       PT LONG TERM GOAL #1   Title Pt will demonstrate decrease in NDI by at least 19% in order to demonstrate clinically significant reduction in disability related to neck pain    Baseline 08/30/20: 58%; 10/11/20: 64%;11/15/20: 42%    Time 12    Period Weeks    Status Partially Met    Target Date 02/07/21      PT LONG TERM GOAL #2   Title Pt will decrease worst neck pain as reported on NPRS by at least 2 points in order to demonstrate clinically significant reduction in back pain.    Baseline 08/30/20: worst 8/10; 10/11/20: 8/10; 11/15/20: 8/10;    Time 12    Period Weeks    Status On-going    Target Date 02/07/21      PT LONG TERM GOAL #3   Title Pt will report at least 30% improvement in her neck symptoms in order to improve pain-free function at hom    Baseline 10/11/20: No change; 11/15/20: 5-6%    Time 12    Period Weeks    Status Partially Met    Target Date 02/07/21      PT LONG TERM GOAL #4   Title Pt will increase self-selected 10MWT to at least 0.6 m/s in order to demonstrate clinically significant improvement in household and community ambulation    Baseline 09/04/20: self-selected: 26.8s = 0.37 m/s; 10/11/20: 17.6s = 0.57 m/s; 11/15/20: 16.6s =  0.62ms    Time 8    Period Weeks    Status Revised    Target Date 02/07/21      PT LONG TERM GOAL #5   Title Pt will improve BERG  to at least 49/56 in order to demonstrate clinically significant improvement in balance and lower  risk for falls.    Baseline 09/04/20: 40/56; 10/11/20: 46/56; 11/15/20: 48/56    Time 8    Period Weeks    Status Partially Met    Target Date 02/07/21                   Plan - 01/01/21 1113     Clinical Impression Statement  Continued with current plan of care as laid out in evaluation and recent prior sessions. All interventions tolerated as expected by author. Mobility and strength continue to progress as anticipated. Recovery intervals given as needed based on signs of exertion and/or pt request. The patient's therapy prognosis indicates continued potential for improvement, anticipate that future progress is attainable in a reasonable/predictable timeframe. Maximum improvement is within reach. Pt will continue to benefit from skilled PT services to address deficits and impairment identified in evaluation in order to maximize independence and safety in basic mobility required for performance of ADL, IADL, and leisure.    Personal Factors and Comorbidities Age;Comorbidity 3+;Past/Current Experience;Time since onset of injury/illness/exacerbation    Comorbidities cervical fusion, lumbar fusion, HTN, chronic pain, TKR, R shoulder RTC tear s/p surgery    Examination-Activity Limitations Bend;Hygiene/Grooming;Dressing;Lift;Reach Overhead;Squat;Stairs;Stand    Examination-Participation Restrictions Cleaning;Community Activity;Laundry;Meal Prep;Shop    Stability/Clinical Decision Making Unstable/Unpredictable    Clinical Decision Making High    Rehab Potential Fair    PT Frequency 2x / week    PT Duration 12 weeks    PT Treatment/Interventions ADLs/Self Care Home Management;Aquatic Therapy;Biofeedback;Canalith Repostioning;Cryotherapy;Electrical Stimulation;Iontophoresis 39m/ml Dexamethasone;Moist Heat;Traction;Ultrasound;DME Instruction;Gait training;Stair training;Functional mobility  training;Therapeutic activities;Therapeutic exercise;Balance training;Neuromuscular re-education;Patient/family education;Manual techniques;Passive range of motion;Dry needling;Vestibular;Spinal Manipulations;Joint Manipulations    PT Next Visit Plan LE strengthening, balance, neck modalities/strengthening    PT Home Exercise Plan Access Code: [email or web address]   Consulted and Agree with Plan of Care Patient             Patient will benefit from skilled therapeutic intervention in order to improve the following deficits and impairments:  Abnormal gait, Decreased balance, Decreased strength, Pain  Visit Diagnosis: Muscle weakness (generalized)  Other abnormalities of gait and mobility  Cervicalgia     Problem List Patient Active Problem List   Diagnosis Date Noted   UTI (urinary tract infection) 08/03/2020   Chronic back pain    Hypertension    GERD (gastroesophageal reflux disease)    12:15 PM, 01/01/21 AEtta Grandchild PT, DPT Physical Therapist [facility] [place]     BNew DouglasC, PT 01/01/2021, 11:16 AM  [place] [facility] [place] [address]Phone: [phone]  Fax:  [phone] Name: HVerena ShawgoMRN: [id number] of Birth: [birth year]

## 2021-01-03 ENCOUNTER — Ambulatory Visit: Payer: Medicare Other

## 2021-01-03 ENCOUNTER — Other Ambulatory Visit: Payer: Self-pay

## 2021-01-03 DIAGNOSIS — M542 Cervicalgia: Secondary | ICD-10-CM

## 2021-01-03 DIAGNOSIS — M6281 Muscle weakness (generalized): Secondary | ICD-10-CM

## 2021-01-03 DIAGNOSIS — R2689 Other abnormalities of gait and mobility: Secondary | ICD-10-CM

## 2021-01-03 NOTE — Therapy (Addendum)
[facility] [address] [place], [place], [address] Phone: [phone]   Fax:  [phone]  Physical Therapy Treatment   Patient Details  Name: Marilyn Keith MRN: [id number] Date of Birth: 01/05/1946 Referring Provider (PT): Bubba Camp FNP    Encounter Date: 01/03/2021   PT End of Session - 01/03/21 1321     Visit Number 28    Number of Visits 63    Date for PT Re-Evaluation 02/07/21    Authorization Type Medicare (traditional); BCBS secondary    Authorization Time Period 11/15/20-02/07/21    PT Start Time 1315    PT Stop Time 1400    PT Time Calculation (min) 45 min    Equipment Utilized During Treatment Gait belt    Activity Tolerance Patient tolerated treatment well    Behavior During Therapy WFL for tasks assessed/performed                  Past Medical History:  Diagnosis Date   Chronic back pain    GERD (gastroesophageal reflux disease)    Hypertension     Past Surgical History:  Procedure Laterality Date   BACK SURGERY     EYE SURGERY     MASTECTOMY     REPLACEMENT TOTAL KNEE BILATERAL     SHOULDER SURGERY Right     There were no vitals filed for this visit.     Subjective Assessment - 01/03/21 1320     Subjective Pt doing alright today. She reports continued persistent chronic pain but no particular worsening with the exception of some bad bilateral hand pain early this morning.    Pertinent History Pt complains of neck and bilateral shoulder pain for the last 10 years. "If I don't take my Lyrica it hurts so bad that I cry." She also complains of constant headaches on the crown of her head. She takes a lot of Excedrin for the headaches. She also complains of bilateral shoulder pain which is independent from her neck. History of 3 rotator cuffs on her R shoulder and is currently unable to lift her R arm. She has been advised that she needs surgery for her shoulder but she is not interested  in surgical intervention at this time. She has a history of chronic back pain and reports extensive spinal fusions. "I'm all fused except for my neck." She reports a remote cervical fusion as well but does not recall which level. She complains of chronic R hip pain which prevents her from laying on her R side. She has had bilateral TKR and states that this caused a leg length difference in her lower extremities. Currently she wears a lift in her R shoe. She has a lot of R knee instability and wears a brace on her R knee to prevent it from buckling. Pt has had two to three falls in the last 6 months and many "almost falls." She uses and single point cane in RUE when out in the community and uses a rollator at home. History of physical therapy after her bilateral TKR and her spinal fusions. She also had an evaluation for her gait/balance in [place] but due to [place] pandemic was unable to return for treatment. While living in [place] she was getting trigger point injections for her neck pain which were helpful however since moving to [facility] she has not had any injections. She has had spinal injections in the past as well with very brief relief. History of R  breast cancer s/p bilateral mastectomy. She has RUE lymphedema and pt has a home compression unit but she doesn't use it because she states that it doesn't help.                TREATMENT   Ther-ex  NuStep L2 x 10 minutes for warm-up during history BLE, therapist monitoring and adjusting resistance as needed (5 minutes unbilled);  Seated bicep curls with 4# dumbbells (DB) x 12 BUE; Seated chest press with cane and resistance from therapist (increased resistance today) x 25; Seated rows with cane and resistance from therapist x 25; Seated tricep press down with cane and therapist providing resistance x 25; Seated L shoulder flexion with 1# DB x 25, 2# DB x 15;  Seated marches with 5# ankle weight x 1 minute; Seated adductor ball  squeeze x 1 minute; Seated clams with green tband x 1 minute;   Manual Therapy  Moist-heat pack to cervical paraspinals x 2 minutes (unbilled); Light IASTM with long sweeping strokes to bilateral posterior cervical paraspinals, upper traps, and levator scapulae bilaterally, continued monitoring for patient response during intervention to assure no increase in pain.  Patient denies any increase in pain during manual techniques.   Pt educated throughout session about proper posture and technique with exercises. Improved exercise technique, movement at target joints, use of target muscles after min to mod verbal, visual, tactile cues.    Pt demonstrates excellent motivation during his session today. She denies any increase in her neck pain during session.  Continued with UE strengthening during session today. She is able to increase the weight of her L shoulder flexion to 2 pound dumbbells. Avoided any WB exercises for LEs due to increase in persistent R knee pain since a week ago Sunday. Continued with manual techniques for neck pain which are helpful at decreasing pain. Overall patient making great progress towards her goals.  Pt advised to continue HEP and follow-up as scheduled.  We will continue to introduce additional manual techniques and strengthening for her neck as she is able to tolerate. She will benefit from PT services to address deficits in pain, strength, balance, and mobility in order to improve function at home and decreased risk for falls.              PT Short Term Goals - 11/28/20 1436       PT SHORT TERM GOAL #1   Title Pt will be independent with HEP in order to decrease neck pain and improve balance/gait to improve pain-free function at home and decrease risk for falls    Time 6    Period Weeks    Status On-going    Target Date 12/28/20               PT Long Term Goals - 11/28/20 1145       PT LONG TERM GOAL #1   Title Pt will demonstrate decrease in NDI  by at least 19% in order to demonstrate clinically significant reduction in disability related to neck pain    Baseline 08/30/20: 58%; 10/11/20: 64%;11/15/20: 42%    Time 12    Period Weeks    Status Partially Met    Target Date 02/07/21      PT LONG TERM GOAL #2   Title Pt will decrease worst neck pain as reported on NPRS by at least 2 points in order to demonstrate clinically significant reduction in back pain.    Baseline 08/30/20: worst 8/10; 10/11/20: 8/10;  11/15/20: 8/10;    Time 12    Period Weeks    Status On-going    Target Date 02/07/21      PT LONG TERM GOAL #3   Title Pt will report at least 30% improvement in her neck symptoms in order to improve pain-free function at hom    Baseline 10/11/20: No change; 11/15/20: 5-6%    Time 12    Period Weeks    Status Partially Met    Target Date 02/07/21      PT LONG TERM GOAL #4   Title Pt will increase self-selected 10MWT to at least 0.6 m/s in order to demonstrate clinically significant improvement in household and community ambulation    Baseline 09/04/20: self-selected: 26.8s = 0.37 m/s; 10/11/20: 17.6s = 0.57 m/s; 11/15/20: 16.6s =  0.46ms    Time 8    Period Weeks    Status Revised    Target Date 02/07/21      PT LONG TERM GOAL #5   Title Pt will improve BERG to at least 49/56 in order to demonstrate clinically significant improvement in balance and lower risk for falls.    Baseline 09/04/20: 40/56; 10/11/20: 46/56; 11/15/20: 48/56    Time 8    Period Weeks    Status Partially Met    Target Date 02/07/21                   Plan - 01/03/21 1322     Clinical Impression Statement Pt demonstrates excellent motivation during his session today. She denies any increase in her neck pain during session.  Continued with UE strengthening during session today. She is able to increase the weight of her L shoulder flexion to 2 pound dumbbells. Avoided any WB exercises for LEs due to increase in persistent R knee pain since a week ago  Sunday. Continued with manual techniques for neck pain which are helpful at decreasing pain. Overall patient making great progress towards her goals.  Pt advised to continue HEP and follow-up as scheduled.  We will continue to introduce additional manual techniques and strengthening for her neck as she is able to tolerate. She will benefit from PT services to address deficits in pain, strength, balance, and mobility in order to improve function at home and decreased risk for falls.    Personal Factors and Comorbidities Age;Comorbidity 3+;Past/Current Experience;Time since onset of injury/illness/exacerbation    Comorbidities cervical fusion, lumbar fusion, HTN, chronic pain, TKR, R shoulder RTC tear s/p surgery    Examination-Activity Limitations Bend;Hygiene/Grooming;Dressing;Lift;Reach Overhead;Squat;Stairs;Stand    Examination-Participation Restrictions Cleaning;Community Activity;Laundry;Meal Prep;Shop    Stability/Clinical Decision Making Unstable/Unpredictable    Rehab Potential Fair    PT Frequency 2x / week    PT Duration 12 weeks    PT Treatment/Interventions ADLs/Self Care Home Management;Aquatic Therapy;Biofeedback;Canalith Repostioning;Cryotherapy;Electrical Stimulation;Iontophoresis 460mml Dexamethasone;Moist Heat;Traction;Ultrasound;DME Instruction;Gait training;Stair training;Functional mobility training;Therapeutic activities;Therapeutic exercise;Balance training;Neuromuscular re-education;Patient/family education;Manual techniques;Passive range of motion;Dry needling;Vestibular;Spinal Manipulations;Joint Manipulations    PT Next Visit Plan LE strengthening, balance, neck modalities/strengthening    PT Home Exercise Plan Access Code: [email or web address]  Consulted and Agree with Plan of Care Patient                      Patient will benefit from skilled therapeutic intervention in order to improve the following deficits and impairments:  Abnormal gait, Decreased balance, Decreased  strength, Pain  Visit Diagnosis: Muscle weakness (generalized)  Other abnormalities of gait and mobility  Cervicalgia  Problem List Patient Active Problem List   Diagnosis Date Noted   UTI (urinary tract infection) 08/03/2020   Chronic back pain    Hypertension    GERD (gastroesophageal reflux disease)    Marilyn Keith PT, DPT, GCS  Keoni Havey 01/03/2021, 2:28 PM  [facility] [address] [place], [place], [address] Phone: [phone]   Fax:  [phone]  Name: Marilyn Keith MRN: [id number] Date of Birth: 09-06-1945

## 2021-01-17 ENCOUNTER — Other Ambulatory Visit: Payer: Self-pay

## 2021-01-17 ENCOUNTER — Ambulatory Visit: Payer: Medicare Other | Attending: Family Medicine

## 2021-01-17 VITALS — BP 143/82 | HR 104

## 2021-01-17 DIAGNOSIS — M542 Cervicalgia: Secondary | ICD-10-CM | POA: Diagnosis present

## 2021-01-17 DIAGNOSIS — M6281 Muscle weakness (generalized): Secondary | ICD-10-CM | POA: Insufficient documentation

## 2021-01-17 DIAGNOSIS — R2689 Other abnormalities of gait and mobility: Secondary | ICD-10-CM | POA: Insufficient documentation

## 2021-01-17 NOTE — Therapy (Signed)
[facility] [address] [place], [place], [address] Phone: [phone]   Fax:  [phone]  Physical Therapy Treatment   Patient Details  Name: Marilyn Keith MRN: [id number] Date of Birth: Jul 18, 1945 Referring Provider (PT): Bubba Camp FNP    Encounter Date: 01/17/2021   PT End of Session - 01/17/21 1458     Visit Number 29    Number of Visits 16    Date for PT Re-Evaluation 02/07/21    Authorization Type Medicare (traditional); BCBS secondary    Authorization Time Period 11/15/20-02/07/21    PT Start Time 1448    PT Stop Time 1520    PT Time Calculation (min) 32 min    Equipment Utilized During Treatment Gait belt    Activity Tolerance Patient tolerated treatment well    Behavior During Therapy WFL for tasks assessed/performed                  Past Medical History:  Diagnosis Date   Chronic back pain    GERD (gastroesophageal reflux disease)    Hypertension     Past Surgical History:  Procedure Laterality Date   BACK SURGERY     EYE SURGERY     MASTECTOMY     REPLACEMENT TOTAL KNEE BILATERAL     SHOULDER SURGERY Right     Vitals:   01/17/21 1451  BP: (!) 143/82  Pulse: (!) 104  SpO2: 99%       Subjective Assessment - 01/17/21 1454     Subjective Pt states she is feeling very fatigued today. She is still recovering with her recent bout of COVID. She had a lot of coughing and still feels weak. She is unsure if she can complete an entire 45 minute session of physical therapy but would like to do some exercise.    Pertinent History Pt complains of neck and bilateral shoulder pain for the last 10 years. "If I don't take my Lyrica it hurts so bad that I cry." She also complains of constant headaches on the crown of her head. She takes a lot of Excedrin for the headaches. She also complains of bilateral shoulder pain which is independent from her neck. History of 3 rotator cuffs on her R shoulder  and is currently unable to lift her R arm. She has been advised that she needs surgery for her shoulder but she is not interested in surgical intervention at this time. She has a history of chronic back pain and reports extensive spinal fusions. "I'm all fused except for my neck." She reports a remote cervical fusion as well but does not recall which level. She complains of chronic R hip pain which prevents her from laying on her R side. She has had bilateral TKR and states that this caused a leg length difference in her lower extremities. Currently she wears a lift in her R shoe. She has a lot of R knee instability and wears a brace on her R knee to prevent it from buckling. Pt has had two to three falls in the last 6 months and many "almost falls." She uses and single point cane in RUE when out in the community and uses a rollator at home. History of physical therapy after her bilateral TKR and her spinal fusions. She also had an evaluation for her gait/balance in [place] but due to [place] pandemic was unable to return for treatment. While living in [place] she was getting trigger point injections for her  neck pain which were helpful however since moving to [facility] she has not had any injections. She has had spinal injections in the past as well with very brief relief. History of R breast cancer s/p bilateral mastectomy. She has RUE lymphedema and pt has a home compression unit but she doesn't use it because she states that it doesn't help.                TREATMENT   Ther-ex  NuStep L0-1 x 10 minutes for warm-up during history BLE, therapist monitoring SpO2 and HR as well as RPE and adjusting resistance as needed;  Seated bicep curls with 5# ankle weight strapped to PVC pipe x 15; Seated chest press with cane and resistance from therapist (increased resistance today) x 10; Seated rows with cane and resistance from therapist x 10; Seated L shoulder flexion with 1# DB x 20;   Manual  Therapy  Light STM to bilateral posterior cervical paraspinals, upper traps, and levator scapulae bilaterally, continued monitoring for patient response during intervention to assure no increase in pain.  Patient denies any increase in pain during manual techniques.   Pt educated throughout session about proper posture and technique with exercises. Improved exercise technique, movement at target joints, use of target muscles after min to mod verbal, visual, tactile cues.    Pt demonstrates excellent motivation during his session today however is very fatigued and session is abbreviated at her request. She denies any increase in her neck pain during session.  Continued with UE strengthening during session today. Overall patient making great progress towards her goals and assuming she is feeling well will update her outcome measures/goals at next session.  Pt advised to continue HEP and follow-up as scheduled.  We will continue to introduce additional manual techniques and strengthening for her neck as she is able to tolerate. She will benefit from PT services to address deficits in pain, strength, balance, and mobility in order to improve function at home and decreased risk for falls.              PT Short Term Goals - 11/28/20 1436       PT SHORT TERM GOAL #1   Title Pt will be independent with HEP in order to decrease neck pain and improve balance/gait to improve pain-free function at home and decrease risk for falls    Time 6    Period Weeks    Status On-going    Target Date 12/28/20               PT Long Term Goals - 11/28/20 1145       PT LONG TERM GOAL #1   Title Pt will demonstrate decrease in NDI by at least 19% in order to demonstrate clinically significant reduction in disability related to neck pain    Baseline 08/30/20: 58%; 10/11/20: 64%;11/15/20: 42%    Time 12    Period Weeks    Status Partially Met    Target Date 02/07/21      PT LONG TERM GOAL #2   Title Pt  will decrease worst neck pain as reported on NPRS by at least 2 points in order to demonstrate clinically significant reduction in back pain.    Baseline 08/30/20: worst 8/10; 10/11/20: 8/10; 11/15/20: 8/10;    Time 12    Period Weeks    Status On-going    Target Date 02/07/21      PT LONG TERM GOAL #3   Title  Pt will report at least 30% improvement in her neck symptoms in order to improve pain-free function at hom    Baseline 10/11/20: No change; 11/15/20: 5-6%    Time 12    Period Weeks    Status Partially Met    Target Date 02/07/21      PT LONG TERM GOAL #4   Title Pt will increase self-selected 10MWT to at least 0.6 m/s in order to demonstrate clinically significant improvement in household and community ambulation    Baseline 09/04/20: self-selected: 26.8s = 0.37 m/s; 10/11/20: 17.6s = 0.57 m/s; 11/15/20: 16.6s =  0.39ms    Time 8    Period Weeks    Status Revised    Target Date 02/07/21      PT LONG TERM GOAL #5   Title Pt will improve BERG to at least 49/56 in order to demonstrate clinically significant improvement in balance and lower risk for falls.    Baseline 09/04/20: 40/56; 10/11/20: 46/56; 11/15/20: 48/56    Time 8    Period Weeks    Status Partially Met    Target Date 02/07/21                   Plan - 01/17/21 1458     Clinical Impression Statement Pt demonstrates excellent motivation during his session today however is very fatigued and session is abbreviated at her request. She denies any increase in her neck pain during session.  Continued with UE strengthening during session today. Overall patient making great progress towards her goals and assuming she is feeling well will update her outcome measures/goals at next session.  Pt advised to continue HEP and follow-up as scheduled.  We will continue to introduce additional manual techniques and strengthening for her neck as she is able to tolerate. She will benefit from PT services to address deficits in pain,  strength, balance, and mobility in order to improve function at home and decreased risk for falls.    Personal Factors and Comorbidities Age;Comorbidity 3+;Past/Current Experience;Time since onset of injury/illness/exacerbation    Comorbidities cervical fusion, lumbar fusion, HTN, chronic pain, TKR, R shoulder RTC tear s/p surgery    Examination-Activity Limitations Bend;Hygiene/Grooming;Dressing;Lift;Reach Overhead;Squat;Stairs;Stand    Examination-Participation Restrictions Cleaning;Community Activity;Laundry;Meal Prep;Shop    Stability/Clinical Decision Making Unstable/Unpredictable    Rehab Potential Fair    PT Frequency 2x / week    PT Duration 12 weeks    PT Treatment/Interventions ADLs/Self Care Home Management;Aquatic Therapy;Biofeedback;Canalith Repostioning;Cryotherapy;Electrical Stimulation;Iontophoresis 432mml Dexamethasone;Moist Heat;Traction;Ultrasound;DME Instruction;Gait training;Stair training;Functional mobility training;Therapeutic activities;Therapeutic exercise;Balance training;Neuromuscular re-education;Patient/family education;Manual techniques;Passive range of motion;Dry needling;Vestibular;Spinal Manipulations;Joint Manipulations    PT Next Visit Plan LE strengthening, balance, neck modalities/strengthening    PT Home Exercise Plan Access Code: [email or web address]  Consulted and Agree with Plan of Care Patient                      Patient will benefit from skilled therapeutic intervention in order to improve the following deficits and impairments:  Abnormal gait, Decreased balance, Decreased strength, Pain  Visit Diagnosis: Muscle weakness (generalized)  Other abnormalities of gait and mobility  Cervicalgia     Problem List Patient Active Problem List   Diagnosis Date Noted   UTI (urinary tract infection) 08/03/2020   Chronic back pain    Hypertension    GERD (gastroesophageal reflux disease)    JaLyndel Safeuprich PT, DPT, GCS  Ainsleigh Kakos 01/17/2021,  4:22 PM  [place]  [facility] [address] [place], [place], [address] Phone: [phone]   Fax:  [phone]  Name: Marilyn Keith MRN: [id number] Date of Birth: 04-Aug-1945

## 2021-01-23 ENCOUNTER — Other Ambulatory Visit: Payer: Self-pay

## 2021-01-23 ENCOUNTER — Ambulatory Visit: Payer: Medicare Other

## 2021-01-23 DIAGNOSIS — M542 Cervicalgia: Secondary | ICD-10-CM

## 2021-01-23 DIAGNOSIS — M6281 Muscle weakness (generalized): Secondary | ICD-10-CM | POA: Diagnosis not present

## 2021-01-23 DIAGNOSIS — R2689 Other abnormalities of gait and mobility: Secondary | ICD-10-CM

## 2021-01-23 NOTE — Therapy (Signed)
[facility] [address]. [place], [place], [address] Phone: [phone]   Fax:  [phone]  Physical Therapy Progress Note   Dates of reporting period  11/28/20   to   01/23/21   Patient Details  Name: Onelia Cadmus MRN: [id number] Date of Birth: March 07, 1945 Referring Provider (PT): Bubba Camp FNP    Encounter Date: 01/23/2021   PT End of Session - 01/23/21 1204     Visit Number 30    Number of Visits 62    Date for PT Re-Evaluation 02/07/21    Authorization Type Medicare (traditional); BCBS secondary    Authorization Time Period 11/15/20-02/07/21    PT Start Time 1150    PT Stop Time 1235    PT Time Calculation (min) 45 min    Equipment Utilized During Treatment Gait belt    Activity Tolerance Patient tolerated treatment well    Behavior During Therapy WFL for tasks assessed/performed                  Past Medical History:  Diagnosis Date   Chronic back pain    GERD (gastroesophageal reflux disease)    Hypertension     Past Surgical History:  Procedure Laterality Date   BACK SURGERY     EYE SURGERY     MASTECTOMY     REPLACEMENT TOTAL KNEE BILATERAL     SHOULDER SURGERY Right     There were no vitals filed for this visit.      Subjective Assessment - 01/23/21 1158     Subjective Pt states her back is in mild/mod amount of pain, especially neck. Still coughing frequently, but has said it is getting better. Pt. states she is feeling fatigued due to the coughing. Pt. is still recovering from knee subluxing over holidays and states it is sore and swollen.    Pertinent History Pt complains of neck and bilateral shoulder pain for the last 10 years. "If I don't take my Lyrica it hurts so bad that I cry." She also complains of constant headaches on the crown of her head. She takes a lot of Excedrin for the headaches. She also complains of bilateral shoulder pain which is independent from her neck.  History of 3 rotator cuffs on her R shoulder and is currently unable to lift her R arm. She has been advised that she needs surgery for her shoulder but she is not interested in surgical intervention at this time. She has a history of chronic back pain and reports extensive spinal fusions. "I'm all fused except for my neck." She reports a remote cervical fusion as well but does not recall which level. She complains of chronic R hip pain which prevents her from laying on her R side. She has had bilateral TKR and states that this caused a leg length difference in her lower extremities. Currently she wears a lift in her R shoe. She has a lot of R knee instability and wears a brace on her R knee to prevent it from buckling. Pt has had two to three falls in the last 6 months and many "almost falls." She uses and single point cane in RUE when out in the community and uses a rollator at home. History of physical therapy after her bilateral TKR and her spinal fusions. She also had an evaluation for her gait/balance in [place] but due to [place] pandemic was unable to return for treatment. While living in [place] she was getting trigger  point injections for her neck pain which were helpful however since moving to [facility] she has not had any injections. She has had spinal injections in the past as well with very brief relief. History of R breast cancer s/p bilateral mastectomy. She has RUE lymphedema and pt has a home compression unit but she doesn't use it because she states that it doesn't help.                TREATMENT   Ther-ex  NuStep L0-2 x 10 minutes for warm-up during history BLE, therapist monitoring SpO2 and HR; Seated bicep curls with 4# dumbbells x 7; Seated chest press with cane and resistance from therapist x 10; Seated rows with PVC pipe and resistance from therapist x 10; Seated L shoulder flexion with 1# DB x 20; Seated L shoulder abduction with 1# DB x 20; Seated tricep press  downs with PVC pipe x 20; Seated marches 4# ankle weights 20x BLE Seated LAQ 4# ankle weights 20x BLE    Manual Therapy  Light STM to bilateral posterior cervical paraspinals, upper traps, and levator scapulae bilaterally, continued monitoring for patient response during intervention to assure no increase in pain.  Patient denies any increase in pain during manual techniques.   Pt educated throughout session about proper posture and technique with exercises. Improved exercise technique, movement at target joints, use of target muscles after min to mod verbal, visual, tactile cues.    Pt demonstrates excellent motivation during his session today however is very fatigued. Did not update outcome measures with patient secondary to fatigue and increase in her R knee pain upon arrival so deferred until next session. She denies any increase in her neck pain during session. Pt still had some coughing and shortness of breathe present post covid. Frequent breaks taken due to overall fatigue. Continued with UE strengthening during session today. Able to complete all strengthening exercises without issue. Overall patient making great progress towards her goals and assuming she is feeling well will update her outcome measures/goals at next session.  Pt advised to continue HEP and follow-up as scheduled.  We will continue to introduce additional manual techniques and strengthening for her neck as she is able to tolerate. She will benefit from PT services to address deficits in pain, strength, balance, and mobility in order to improve function at home and decreased risk for falls.              PT Short Term Goals - 01/23/21 1423       PT SHORT TERM GOAL #1   Title Pt will be independent with HEP in order to decrease neck pain and improve balance/gait to improve pain-free function at home and decrease risk for falls    Time 6    Period Weeks    Status On-going    Target Date 12/28/20                PT Long Term Goals - 01/23/21 1423       PT LONG TERM GOAL #1   Title Pt will demonstrate decrease in NDI by at least 19% in order to demonstrate clinically significant reduction in disability related to neck pain    Baseline 08/30/20: 58%; 10/11/20: 64%;11/15/20: 42%    Time 12    Period Weeks    Status Partially Met    Target Date 02/07/21      PT LONG TERM GOAL #2   Title Pt will decrease worst neck pain as  reported on NPRS by at least 2 points in order to demonstrate clinically significant reduction in back pain.    Baseline 08/30/20: worst 8/10; 10/11/20: 8/10; 11/15/20: 8/10;    Time 12    Period Weeks    Status On-going    Target Date 02/07/21      PT LONG TERM GOAL #3   Title Pt will report at least 30% improvement in her neck symptoms in order to improve pain-free function at hom    Baseline 10/11/20: No change; 11/15/20: 5-6%    Time 12    Period Weeks    Status Partially Met    Target Date 02/07/21      PT LONG TERM GOAL #4   Title Pt will increase self-selected 10MWT to at least 0.6 m/s in order to demonstrate clinically significant improvement in household and community ambulation    Baseline 09/04/20: self-selected: 26.8s = 0.37 m/s; 10/11/20: 17.6s = 0.57 m/s; 11/15/20: 16.6s =  0.42ms    Time 8    Period Weeks    Status Revised    Target Date 02/07/21      PT LONG TERM GOAL #5   Title Pt will improve BERG to at least 49/56 in order to demonstrate clinically significant improvement in balance and lower risk for falls.    Baseline 09/04/20: 40/56; 10/11/20: 46/56; 11/15/20: 48/56    Time 8    Period Weeks    Status Partially Met    Target Date 02/07/21                   Plan - 01/23/21 1205     Clinical Impression Statement Pt demonstrates excellent motivation during his session today however is very fatigued. Did not update outcome measures with patient secondary to fatigue and increase in her R knee pain upon arrival so deferred until next session.  She denies any increase in her neck pain during session. Pt still had some coughing and shortness of breathe present post covid. Frequent breaks taken due to overall fatigue. Continued with UE strengthening during session today. Able to complete all strengthening exercises without issue. Overall patient making great progress towards her goals and assuming she is feeling well will update her outcome measures/goals at next session.  Pt advised to continue HEP and follow-up as scheduled.  We will continue to introduce additional manual techniques and strengthening for her neck as she is able to tolerate. She will benefit from PT services to address deficits in pain, strength, balance, and mobility in order to improve function at home and decreased risk for falls.?    Personal Factors and Comorbidities Age;Comorbidity 3+;Past/Current Experience;Time since onset of injury/illness/exacerbation    Comorbidities cervical fusion, lumbar fusion, HTN, chronic pain, TKR, R shoulder RTC tear s/p surgery    Examination-Activity Limitations Bend;Hygiene/Grooming;Dressing;Lift;Reach Overhead;Squat;Stairs;Stand    Examination-Participation Restrictions Cleaning;Community Activity;Laundry;Meal Prep;Shop    Stability/Clinical Decision Making Unstable/Unpredictable    Rehab Potential Fair    PT Frequency 2x / week    PT Duration 12 weeks    PT Treatment/Interventions ADLs/Self Care Home Management;Aquatic Therapy;Biofeedback;Canalith Repostioning;Cryotherapy;Electrical Stimulation;Iontophoresis 436mml Dexamethasone;Moist Heat;Traction;Ultrasound;DME Instruction;Gait training;Stair training;Functional mobility training;Therapeutic activities;Therapeutic exercise;Balance training;Neuromuscular re-education;Patient/family education;Manual techniques;Passive range of motion;Dry needling;Vestibular;Spinal Manipulations;Joint Manipulations    PT Next Visit Plan LE strengthening, balance, neck modalities/strengthening    PT Home  Exercise Plan Access Code: [email or web address]  Consulted and Agree with Plan of Care Patient  Patient will benefit from skilled therapeutic intervention in order to improve the following deficits and impairments:  Abnormal gait, Decreased balance, Decreased strength, Pain  Visit Diagnosis: Muscle weakness (generalized)  Other abnormalities of gait and mobility  Cervicalgia     Problem List Patient Active Problem List   Diagnosis Date Noted   UTI (urinary tract infection) 08/03/2020   Chronic back pain    Hypertension    GERD (gastroesophageal reflux disease)    Lyndel Safe Casimiro Lienhard PT, DPT, GCS  Kayleeann Huxford 01/23/2021, 2:28 PM  [facility] [address]. [place], [place], [address] Phone: [phone]   Fax:  [phone]  Name: Marjan Rosman MRN: [id number] Date of Birth: September 09, 1945

## 2021-01-28 ENCOUNTER — Other Ambulatory Visit: Payer: Self-pay

## 2021-01-28 ENCOUNTER — Ambulatory Visit: Payer: Medicare Other

## 2021-01-28 DIAGNOSIS — R2689 Other abnormalities of gait and mobility: Secondary | ICD-10-CM

## 2021-01-28 DIAGNOSIS — M6281 Muscle weakness (generalized): Secondary | ICD-10-CM

## 2021-01-28 DIAGNOSIS — M542 Cervicalgia: Secondary | ICD-10-CM

## 2021-01-28 NOTE — Therapy (Addendum)
[facility] [address]. [place], [place], [address] Phone: [phone]   Fax:  [phone]  Physical Therapy Note   Patient Details  Name: Marilyn Keith MRN: [id number] Date of Birth: 01/04/1946 Referring Provider (PT): Bubba Camp FNP    Encounter Date: 01/28/2021   PT End of Session - 01/28/21 1152     Visit Number 31    Number of Visits 35    Date for PT Re-Evaluation 02/07/21    Authorization Type Medicare (traditional); BCBS secondary    Authorization Time Period 11/15/20-02/07/21    PT Start Time 1150    PT Stop Time 1233    PT Time Calculation (min) 43 min    Equipment Utilized During Treatment Gait belt    Activity Tolerance Patient tolerated treatment well    Behavior During Therapy WFL for tasks assessed/performed                  Past Medical History:  Diagnosis Date   Chronic back pain    GERD (gastroesophageal reflux disease)    Hypertension     Past Surgical History:  Procedure Laterality Date   BACK SURGERY     EYE SURGERY     MASTECTOMY     REPLACEMENT TOTAL KNEE BILATERAL     SHOULDER SURGERY Right     There were no vitals filed for this visit.      Subjective Assessment - 01/28/21 1148     Subjective Pt states her shoulder and knee are in mild/moderate amount of pain. Reports general chronic soreness especially within her shoulder, but no new soreness from last session. Pt reports that her coughing has gotten better.    Pertinent History Pt complains of neck and bilateral shoulder pain for the last 10 years. "If I don't take my Lyrica it hurts so bad that I cry." She also complains of constant headaches on the crown of her head. She takes a lot of Excedrin for the headaches. She also complains of bilateral shoulder pain which is independent from her neck. History of 3 rotator cuffs on her R shoulder and is currently unable to lift her R arm. She has been advised that she needs  surgery for her shoulder but she is not interested in surgical intervention at this time. She has a history of chronic back pain and reports extensive spinal fusions. "I'm all fused except for my neck." She reports a remote cervical fusion as well but does not recall which level. She complains of chronic R hip pain which prevents her from laying on her R side. She has had bilateral TKR and states that this caused a leg length difference in her lower extremities. Currently she wears a lift in her R shoe. She has a lot of R knee instability and wears a brace on her R knee to prevent it from buckling. Pt has had two to three falls in the last 6 months and many "almost falls." She uses and single point cane in RUE when out in the community and uses a rollator at home. History of physical therapy after her bilateral TKR and her spinal fusions. She also had an evaluation for her gait/balance in [place] but due to [place] pandemic was unable to return for treatment. While living in [place] she was getting trigger point injections for her neck pain which were helpful however since moving to [facility] she has not had any injections. She has had spinal injections in the  past as well with very brief relief. History of R breast cancer s/p bilateral mastectomy. She has RUE lymphedema and pt has a home compression unit but she doesn't use it because she states that it doesn't help.                TREATMENT   Ther-ex  NuStep L0-2 x 10 minutes for warm-up during history BLE Seated bicep curls with 4# dumbbells x 4 stopped due to fatigue so switched to 3# dumbbells x 10 Seated chest press with cane and resistance from therapist x 10; Seated rows with PVC pipe and resistance from therapist x 10; Standing marches at // bars 3# AW 2 x 10 bilaterally Standing hamstring curls at // bars 3# AW 2 x 10 bilaterally Standing mini squat at // bars 3# AW 2 x 10    Manual Therapy  Trap stretch 3 x 45s hold  bilaterally  Stretch lateral flexion 3 x 30s hold bilaterally Light STM to bilateral posterior cervical paraspinals, upper traps, and levator scapulae bilaterally, continued monitoring for patient response during intervention to assure no increase in pain.  Patient denies any increase in pain during manual techniques.    Pt demonstrates excellent motivation during session today. Continued with UE strengthening during session but had to switch from 4# to 3# dumbbells for bicep curls due to fatigue. Pt denied any rise in neck pain during session. Pt did not have any coughing or shortness of breath during session, but experienced muscle fatigue during standing exercises at the parallel bars. In last set of mini squat pt began to report some R knee pain. Pt would benefit from further balance and squatting activities to help with ADLs around the home. Pt would benefit from upper thoracic and cervical stretching and STM to help with ROM and stiffness.         PT Short Term Goals - 01/23/21 1423       PT SHORT TERM GOAL #1   Title Pt will be independent with HEP in order to decrease neck pain and improve balance/gait to improve pain-free function at home and decrease risk for falls    Time 6    Period Weeks    Status On-going    Target Date 12/28/20               PT Long Term Goals - 01/23/21 1423       PT LONG TERM GOAL #1   Title Pt will demonstrate decrease in NDI by at least 19% in order to demonstrate clinically significant reduction in disability related to neck pain    Baseline 08/30/20: 58%; 10/11/20: 64%;11/15/20: 42%    Time 12    Period Weeks    Status Partially Met    Target Date 02/07/21      PT LONG TERM GOAL #2   Title Pt will decrease worst neck pain as reported on NPRS by at least 2 points in order to demonstrate clinically significant reduction in back pain.    Baseline 08/30/20: worst 8/10; 10/11/20: 8/10; 11/15/20: 8/10;    Time 12    Period Weeks    Status  On-going    Target Date 02/07/21      PT LONG TERM GOAL #3   Title Pt will report at least 30% improvement in her neck symptoms in order to improve pain-free function at hom    Baseline 10/11/20: No change; 11/15/20: 5-6%    Time 12    Period Weeks  Status Partially Met    Target Date 02/07/21      PT LONG TERM GOAL #4   Title Pt will increase self-selected 10MWT to at least 0.6 m/s in order to demonstrate clinically significant improvement in household and community ambulation    Baseline 09/04/20: self-selected: 26.8s = 0.37 m/s; 10/11/20: 17.6s = 0.57 m/s; 11/15/20: 16.6s =  0.49ms    Time 8    Period Weeks    Status Revised    Target Date 02/07/21      PT LONG TERM GOAL #5   Title Pt will improve BERG to at least 49/56 in order to demonstrate clinically significant improvement in balance and lower risk for falls.    Baseline 09/04/20: 40/56; 10/11/20: 46/56; 11/15/20: 48/56    Time 8    Period Weeks    Status Partially Met    Target Date 02/07/21                   Plan - 01/28/21 1153     Clinical Impression Statement Pt demonstrates excellent motivation during session today. Continued with UE strengthening during session but had to switch from 4# to 3# dumbbells for bicep curls due to fatigue. Pt denied any rise in neck pain during session. Pt did not have any coughing or shortness of breath during session, but experienced muscle fatigue during standing exercises at the parallel bars. In last set of mini squat pt began to report some R knee pain. Pt would benefit from further balance and squatting activities to help with ADLs around the home. Pt would benefit from upper thoracic and cervical stretching and STM to help with ROM and stiffness.    Personal Factors and Comorbidities Age;Comorbidity 3+;Past/Current Experience;Time since onset of injury/illness/exacerbation    Comorbidities cervical fusion, lumbar fusion, HTN, chronic pain, TKR, R shoulder RTC tear s/p surgery     Examination-Activity Limitations Bend;Hygiene/Grooming;Dressing;Lift;Reach Overhead;Squat;Stairs;Stand    Examination-Participation Restrictions Cleaning;Community Activity;Laundry;Meal Prep;Shop    Stability/Clinical Decision Making Unstable/Unpredictable    Rehab Potential Fair    PT Frequency 2x / week    PT Duration 12 weeks    PT Treatment/Interventions ADLs/Self Care Home Management;Aquatic Therapy;Biofeedback;Canalith Repostioning;Cryotherapy;Electrical Stimulation;Iontophoresis 432mml Dexamethasone;Moist Heat;Traction;Ultrasound;DME Instruction;Gait training;Stair training;Functional mobility training;Therapeutic activities;Therapeutic exercise;Balance training;Neuromuscular re-education;Patient/family education;Manual techniques;Passive range of motion;Dry needling;Vestibular;Spinal Manipulations;Joint Manipulations    PT Next Visit Plan LE strengthening, balance, neck modalities/strengthening    PT Home Exercise Plan Access Code: [email or web address]  Consulted and Agree with Plan of Care Patient                      Patient will benefit from skilled therapeutic intervention in order to improve the following deficits and impairments:  Abnormal gait, Decreased balance, Decreased strength, Pain  Visit Diagnosis: Muscle weakness (generalized)  Cervicalgia  Other abnormalities of gait and mobility     Problem List Patient Active Problem List   Diagnosis Date Noted   UTI (urinary tract infection) 08/03/2020   Chronic back pain    Hypertension    GERD (gastroesophageal reflux disease)    Marilyn Keith Safeuprich PT, DPT, GCS  Laverta Harnisch SPT  Huprich,Jason 01/28/2021, 3:58 PM  [place] [facility]MeWheatland[place]hone: [phone] Fax:  [phone]: Marilyn SandaRN: [id number] of Birth: 8/July 29, 1945

## 2021-01-30 ENCOUNTER — Other Ambulatory Visit: Payer: Self-pay

## 2021-01-30 ENCOUNTER — Ambulatory Visit: Payer: Medicare Other

## 2021-01-30 DIAGNOSIS — M542 Cervicalgia: Secondary | ICD-10-CM

## 2021-01-30 DIAGNOSIS — R2689 Other abnormalities of gait and mobility: Secondary | ICD-10-CM

## 2021-01-30 DIAGNOSIS — M6281 Muscle weakness (generalized): Secondary | ICD-10-CM

## 2021-01-30 NOTE — Therapy (Addendum)
[place] [facility] [address] [place], [place], [address] Phone: [phone]   Fax:  [phone]  Physical Therapy Note   Patient Details  Name: Marilyn Keith MRN: [id number] Date of Birth: Jun 02, 1945 Referring Provider (PT): Bubba Camp FNP    Encounter Date: 01/30/2021   PT End of Session - 01/30/21 1151     Visit Number 32    Number of Visits 64    Date for PT Re-Evaluation 02/07/21    Authorization Type Medicare (traditional); BCBS secondary    Authorization Time Period 11/15/20-02/07/21    PT Start Time 1148    PT Stop Time 1235    PT Time Calculation (min) 47 min    Equipment Utilized During Treatment Gait belt    Activity Tolerance Patient tolerated treatment well    Behavior During Therapy WFL for tasks assessed/performed                  Past Medical History:  Diagnosis Date   Chronic back pain    GERD (gastroesophageal reflux disease)    Hypertension     Past Surgical History:  Procedure Laterality Date   BACK SURGERY     EYE SURGERY     MASTECTOMY     REPLACEMENT TOTAL KNEE BILATERAL     SHOULDER SURGERY Right     There were no vitals filed for this visit.      Subjective Assessment - 01/30/21 1148     Subjective Pt states that she did not get a lot of sleep last night due to heartburn, a headache and finger pain, so the pain is worse than usual today. L shoulder slightly sore after last session. Pt reports still coughing occasionally but is much better than previously.    Pertinent History Pt complains of neck and bilateral shoulder pain for the last 10 years. "If I don't take my Lyrica it hurts so bad that I cry." She also complains of constant headaches on the crown of her head. She takes a lot of Excedrin for the headaches. She also complains of bilateral shoulder pain which is independent from her neck. History of 3 rotator cuffs on her R shoulder and is currently unable to lift her R  arm. She has been advised that she needs surgery for her shoulder but she is not interested in surgical intervention at this time. She has a history of chronic back pain and reports extensive spinal fusions. "I'm all fused except for my neck." She reports a remote cervical fusion as well but does not recall which level. She complains of chronic R hip pain which prevents her from laying on her R side. She has had bilateral TKR and states that this caused a leg length difference in her lower extremities. Currently she wears a lift in her R shoe. She has a lot of R knee instability and wears a brace on her R knee to prevent it from buckling. Pt has had two to three falls in the last 6 months and many "almost falls." She uses and single point cane in RUE when out in the community and uses a rollator at home. History of physical therapy after her bilateral TKR and her spinal fusions. She also had an evaluation for her gait/balance in [place] but due to [place] pandemic was unable to return for treatment. While living in [place] she was getting trigger point injections for her neck pain which were helpful however since moving to [facility] she  has not had any injections. She has had spinal injections in the past as well with very brief relief. History of R breast cancer s/p bilateral mastectomy. She has RUE lymphedema and pt has a home compression unit but she doesn't use it because she states that it doesn't help.                TREATMENT   Ther-ex  NuStep L2-3 x 10 minutes for warm-up during history BLE Seated bicep curls with 3# dumbbells 2 x 10 Seated chest press with cane and resistance from therapist 2 x 10; Seated rows with PVC pipe and resistance from therapist 2 x 10; Seated triceps press down PVC pipe and resistance from therapist x 10  High knee marches at // bars 3# AW 2 x 10 bilaterally Standing hamstring curls at // bars 3# AW 2 x 10 bilaterally Standing mini squat at // bars 3#  AW 2 x 10  Seated LAQ #3 AW 2 x 10, only 1 set on R side due to knee and hip pain   Manual Therapy  Upper trap stretch 30s hold 3 x bilaterally Levator stretch 30s hold 2 x bilaterally Light STM to bilateral posterior cervical paraspinals, upper traps, and levator scapulae bilaterally, continued monitoring for patient response during intervention to assure no increase in pain.  Patient denies any increase in pain during manual techniques.     Patient demonstrates excellent motivation during session today. Due to fatigue from lack of sleep patient required more frequent breaks between exercises. Pt denied any increase in neck pain during session. During seated LAQ patient began having mild pain in R hip area and anterior knee. Pt did not have any coughing during session. Patient had minor SOB after standing exercises at the // bars, but was able to catch breathe quickly after sitting down. Pt would benefit from further upper thoracic and cervical stretching and STM to help with ROM and stiffness.    PT Short Term Goals - 01/23/21 1423       PT SHORT TERM GOAL #1   Title Pt will be independent with HEP in order to decrease neck pain and improve balance/gait to improve pain-free function at home and decrease risk for falls    Time 6    Period Weeks    Status On-going    Target Date 12/28/20               PT Long Term Goals - 01/23/21 1423       PT LONG TERM GOAL #1   Title Pt will demonstrate decrease in NDI by at least 19% in order to demonstrate clinically significant reduction in disability related to neck pain    Baseline 08/30/20: 58%; 10/11/20: 64%;11/15/20: 42%    Time 12    Period Weeks    Status Partially Met    Target Date 02/07/21      PT LONG TERM GOAL #2   Title Pt will decrease worst neck pain as reported on NPRS by at least 2 points in order to demonstrate clinically significant reduction in back pain.    Baseline 08/30/20: worst 8/10; 10/11/20: 8/10; 11/15/20:  8/10;    Time 12    Period Weeks    Status On-going    Target Date 02/07/21      PT LONG TERM GOAL #3   Title Pt will report at least 30% improvement in her neck symptoms in order to improve pain-free function at hom  Baseline 10/11/20: No change; 11/15/20: 5-6%    Time 12    Period Weeks    Status Partially Met    Target Date 02/07/21      PT LONG TERM GOAL #4   Title Pt will increase self-selected 10MWT to at least 0.6 m/s in order to demonstrate clinically significant improvement in household and community ambulation    Baseline 09/04/20: self-selected: 26.8s = 0.37 m/s; 10/11/20: 17.6s = 0.57 m/s; 11/15/20: 16.6s =  0.48ms    Time 8    Period Weeks    Status Revised    Target Date 02/07/21      PT LONG TERM GOAL #5   Title Pt will improve BERG to at least 49/56 in order to demonstrate clinically significant improvement in balance and lower risk for falls.    Baseline 09/04/20: 40/56; 10/11/20: 46/56; 11/15/20: 48/56    Time 8    Period Weeks    Status Partially Met    Target Date 02/07/21                   Plan - 01/30/21 1152     Clinical Impression Statement Patient demonstrates excellent motivation during session today. Due to fatigue from lack of sleep patient required more frequent breaks between exercises. Pt denied any increase in neck pain during session. During seated LAQ patient began having mild pain in R hip area and anterior knee. Pt did not have any coughing during session. Patient had minor SOB after standing exercises at the // bars, but was able to catch breathe quickly after sitting down. Pt would benefit from further upper thoracic and cervical stretching and STM to help with ROM and stiffness.    Personal Factors and Comorbidities Age;Comorbidity 3+;Past/Current Experience;Time since onset of injury/illness/exacerbation    Comorbidities cervical fusion, lumbar fusion, HTN, chronic pain, TKR, R shoulder RTC tear s/p surgery    Examination-Activity  Limitations Bend;Hygiene/Grooming;Dressing;Lift;Reach Overhead;Squat;Stairs;Stand    Examination-Participation Restrictions Cleaning;Community Activity;Laundry;Meal Prep;Shop    Stability/Clinical Decision Making Unstable/Unpredictable    Rehab Potential Fair    PT Frequency 2x / week    PT Duration 12 weeks    PT Treatment/Interventions ADLs/Self Care Home Management;Aquatic Therapy;Biofeedback;Canalith Repostioning;Cryotherapy;Electrical Stimulation;Iontophoresis 424mml Dexamethasone;Moist Heat;Traction;Ultrasound;DME Instruction;Gait training;Stair training;Functional mobility training;Therapeutic activities;Therapeutic exercise;Balance training;Neuromuscular re-education;Patient/family education;Manual techniques;Passive range of motion;Dry needling;Vestibular;Spinal Manipulations;Joint Manipulations    PT Next Visit Plan LE strengthening, balance, neck modalities/strengthening    PT Home Exercise Plan Access Code: [email or web address]  Consulted and Agree with Plan of Care Patient                      Patient will benefit from skilled therapeutic intervention in order to improve the following deficits and impairments:  Abnormal gait, Decreased balance, Decreased strength, Pain  Visit Diagnosis: Muscle weakness (generalized)  Cervicalgia  Other abnormalities of gait and mobility     Problem List Patient Active Problem List   Diagnosis Date Noted   UTI (urinary tract infection) 08/03/2020   Chronic back pain    Hypertension    GERD (gastroesophageal reflux disease)     Madalynn Pickelsimer SPT JaLyndel Safeuprich PT, DPT, GCS  Huprich,Jason 01/30/2021, 1:14 PM  [place] [facility]MeSte[place]hone: [phone] Fax:  [phone]: HeJahna LiebertRN: [id number] of Birth: 08/1945/11/19

## 2021-02-04 ENCOUNTER — Ambulatory Visit: Payer: Medicare Other

## 2021-02-04 ENCOUNTER — Other Ambulatory Visit: Payer: Self-pay

## 2021-02-04 DIAGNOSIS — M6281 Muscle weakness (generalized): Secondary | ICD-10-CM | POA: Diagnosis not present

## 2021-02-04 DIAGNOSIS — M542 Cervicalgia: Secondary | ICD-10-CM

## 2021-02-04 NOTE — Therapy (Addendum)
[facility] [address] [place], [place], [address] Phone: [phone]   Fax:  [phone]  Physical Therapy Note   Patient Details  Name: Marilyn Keith MRN: [id number] Date of Birth: 06-May-1945 Referring Provider (PT): Bubba Camp FNP    Encounter Date: 02/04/2021   PT End of Session - 02/04/21 1155     Visit Number 33    Number of Visits 37    Date for PT Re-Evaluation 02/07/21    Authorization Type Medicare (traditional); BCBS secondary    Authorization Time Period 11/15/20-02/07/21    PT Start Time 1150    PT Stop Time 1235    PT Time Calculation (min) 45 min    Equipment Utilized During Treatment Gait belt    Activity Tolerance Patient tolerated treatment well    Behavior During Therapy WFL for tasks assessed/performed                  Past Medical History:  Diagnosis Date   Chronic back pain    GERD (gastroesophageal reflux disease)    Hypertension     Past Surgical History:  Procedure Laterality Date   BACK SURGERY     EYE SURGERY     MASTECTOMY     REPLACEMENT TOTAL KNEE BILATERAL     SHOULDER SURGERY Right     There were no vitals filed for this visit.      Subjective Assessment - 02/04/21 1150     Subjective Pt states that her cough is now completely gone and can feel her breath and strength coming back. Pt has been active today around the house and reports that her back and R knee are in mild/moderate pain. Pt states that her L foot has began to be painful and the pain has been waking her up at night.    Pertinent History Pt complains of neck and bilateral shoulder pain for the last 10 years. "If I don't take my Lyrica it hurts so bad that I cry." She also complains of constant headaches on the crown of her head. She takes a lot of Excedrin for the headaches. She also complains of bilateral shoulder pain which is independent from her neck. History of 3 rotator cuffs on her R shoulder  and is currently unable to lift her R arm. She has been advised that she needs surgery for her shoulder but she is not interested in surgical intervention at this time. She has a history of chronic back pain and reports extensive spinal fusions. "I'm all fused except for my neck." She reports a remote cervical fusion as well but does not recall which level. She complains of chronic R hip pain which prevents her from laying on her R side. She has had bilateral TKR and states that this caused a leg length difference in her lower extremities. Currently she wears a lift in her R shoe. She has a lot of R knee instability and wears a brace on her R knee to prevent it from buckling. Pt has had two to three falls in the last 6 months and many "almost falls." She uses and single point cane in RUE when out in the community and uses a rollator at home. History of physical therapy after her bilateral TKR and her spinal fusions. She also had an evaluation for her gait/balance in [place] but due to [place] pandemic was unable to return for treatment. While living in [place] she was getting trigger point injections for her  neck pain which were helpful however since moving to [facility] she has not had any injections. She has had spinal injections in the past as well with very brief relief. History of R breast cancer s/p bilateral mastectomy. She has RUE lymphedema and pt has a home compression unit but she doesn't use it because she states that it doesn't help.                TREATMENT   Ther-ex  NuStep L2-3 x 10 minutes for warm-up during history BLE Seated marching 3# AW 2 x 1 min Seated LAQ 3# 2 x 1 min Seated bicep curls with 2# dumbbells 2 x 10  Forward and Backward walking at // bars with 3# AW x 4 lengths each way -- very fatigued felt more secure walking backwards than forwards  Standing triceps press down with PVC resistance from therapist x 20 Standing chest press with PVC pipe resistance  from therapist x 20 Standing rows with PVC pipe resistance from therapist x 10    Manual Therapy  Upper trap stretch 2 x bilaterally hold 30s Light STM to bilateral posterior cervical paraspinals, upper traps, and levator scapulae bilaterally, continued monitoring for patient response during intervention to assure no increase in pain.  Patient denies any increase in pain during manual techniques.   Patient demonstrates great motivation today during session. Patient reported having much more energy lately now that her cough is completely gone. Due to increase in energy pt was able to progress from seated to standing resisted shoulder exercises. This provided an opportunity to work on both UE strength as well as balance. During walking and standing exercises pt needed occasional breaks due to muscle fatigue, but recovered quickly when seated. Pt stated that she felt she was starting to be able to gain more ROM during the upper trap stretch. Pt would benefit from further upper thoracic and cervical stretching as well as STM to help with ROM and stiffness.       PT Short Term Goals - 01/23/21 1423       PT SHORT TERM GOAL #1   Title Pt will be independent with HEP in order to decrease neck pain and improve balance/gait to improve pain-free function at home and decrease risk for falls    Time 6    Period Weeks    Status On-going    Target Date 12/28/20               PT Long Term Goals - 01/23/21 1423       PT LONG TERM GOAL #1   Title Pt will demonstrate decrease in NDI by at least 19% in order to demonstrate clinically significant reduction in disability related to neck pain    Baseline 08/30/20: 58%; 10/11/20: 64%;11/15/20: 42%    Time 12    Period Weeks    Status Partially Met    Target Date 02/07/21      PT LONG TERM GOAL #2   Title Pt will decrease worst neck pain as reported on NPRS by at least 2 points in order to demonstrate clinically significant reduction in back pain.     Baseline 08/30/20: worst 8/10; 10/11/20: 8/10; 11/15/20: 8/10;    Time 12    Period Weeks    Status On-going    Target Date 02/07/21      PT LONG TERM GOAL #3   Title Pt will report at least 30% improvement in her neck symptoms in order to improve  pain-free function at hom    Baseline 10/11/20: No change; 11/15/20: 5-6%    Time 12    Period Weeks    Status Partially Met    Target Date 02/07/21      PT LONG TERM GOAL #4   Title Pt will increase self-selected 10MWT to at least 0.6 m/s in order to demonstrate clinically significant improvement in household and community ambulation    Baseline 09/04/20: self-selected: 26.8s = 0.37 m/s; 10/11/20: 17.6s = 0.57 m/s; 11/15/20: 16.6s =  0.52m/s    Time 8    Period Weeks    Status Revised    Target Date 02/07/21      PT LONG TERM GOAL #5   Title Pt will improve BERG to at least 49/56 in order to demonstrate clinically significant improvement in balance and lower risk for falls.    Baseline 09/04/20: 40/56; 10/11/20: 46/56; 11/15/20: 48/56    Time 8    Period Weeks    Status Partially Met    Target Date 02/07/21                   Plan - 02/04/21 1155     Clinical Impression Statement Patient demonstrates great motivation today during session. Patient reported having much more energy lately now that her cough is completely gone. Due to increase in energy pt was able to progress from seated to standing resisted shoulder exercises. This provided an opportunity to work on both UE strength as well as balance. During walking and standing exercises pt needed occasional breaks due to muscle fatigue, but recovered quickly when seated. Pt stated that she felt she was starting to be able to gain more ROM during the upper trap stretch. Pt would benefit from further upper thoracic and cervical stretching as well as STM to help with ROM and stiffness.    Personal Factors and Comorbidities Age;Comorbidity 3+;Past/Current Experience;Time since onset of  injury/illness/exacerbation    Comorbidities cervical fusion, lumbar fusion, HTN, chronic pain, TKR, R shoulder RTC tear s/p surgery    Examination-Activity Limitations Bend;Hygiene/Grooming;Dressing;Lift;Reach Overhead;Squat;Stairs;Stand    Examination-Participation Restrictions Cleaning;Community Activity;Laundry;Meal Prep;Shop    Stability/Clinical Decision Making Unstable/Unpredictable    Rehab Potential Fair    PT Frequency 2x / week    PT Duration 12 weeks    PT Treatment/Interventions ADLs/Self Care Home Management;Aquatic Therapy;Biofeedback;Canalith Repostioning;Cryotherapy;Electrical Stimulation;Iontophoresis 4mg /ml Dexamethasone;Moist Heat;Traction;Ultrasound;DME Instruction;Gait training;Stair training;Functional mobility training;Therapeutic activities;Therapeutic exercise;Balance training;Neuromuscular re-education;Patient/family education;Manual techniques;Passive range of motion;Dry needling;Vestibular;Spinal Manipulations;Joint Manipulations    PT Next Visit Plan LE strengthening, balance, neck modalities/strengthening    PT Home Exercise Plan Access Code: [email or web address]    Consulted and Agree with Plan of Care Patient                      Patient will benefit from skilled therapeutic intervention in order to improve the following deficits and impairments:  Abnormal gait, Decreased balance, Decreased strength, Pain  Visit Diagnosis: Muscle weakness (generalized)  Cervicalgia     Problem List Patient Active Problem List   Diagnosis Date Noted   UTI (urinary tract infection) 08/03/2020   Chronic back pain    Hypertension    GERD (gastroesophageal reflux disease)     Hildagarde Holleran SPT Lyndel Safe Huprich PT, DPT, GCS  Huprich,Jason 02/04/2021, 3:48 PM  [address] [facility] [address] [place], [place], [address] Phone: [phone]   Fax:  [phone]  Name: Ellene Bloodsaw MRN: [id number] Date of Birth:  Feb 24, 1945

## 2021-02-06 ENCOUNTER — Ambulatory Visit: Payer: Medicare Other

## 2021-02-06 ENCOUNTER — Other Ambulatory Visit: Payer: Self-pay

## 2021-02-06 DIAGNOSIS — M542 Cervicalgia: Secondary | ICD-10-CM

## 2021-02-06 DIAGNOSIS — M6281 Muscle weakness (generalized): Secondary | ICD-10-CM | POA: Diagnosis not present

## 2021-02-06 NOTE — Therapy (Addendum)
[facility] [address] [place], [place], [address] Phone: [phone]   Fax:  [phone]  Physical Therapy Note   Patient Details  Name: Marilyn Keith MRN: [id number] Date of Birth: Sep 09, 1945 Referring Provider (PT): Bubba Camp FNP    Encounter Date: 02/06/2021   PT End of Session - 02/06/21 1156     Visit Number 34    Number of Visits 38    Date for PT Re-Evaluation 02/07/21    Authorization Type Medicare (traditional); BCBS secondary    Authorization Time Period 11/15/20-02/07/21    PT Start Time 1145    PT Stop Time 1230    PT Time Calculation (min) 45 min    Equipment Utilized During Treatment Gait belt    Activity Tolerance Patient tolerated treatment well    Behavior During Therapy WFL for tasks assessed/performed                  Past Medical History:  Diagnosis Date   Chronic back pain    GERD (gastroesophageal reflux disease)    Hypertension     Past Surgical History:  Procedure Laterality Date   BACK SURGERY     EYE SURGERY     MASTECTOMY     REPLACEMENT TOTAL KNEE BILATERAL     SHOULDER SURGERY Right     There were no vitals filed for this visit.      Subjective Assessment - 02/06/21 1151     Subjective Pt states that she is getting more energy back as her chronic cough has continued to improve. Her L foot is no longer painful, but now her R arm is hurting when she's at rest. Pt reports no new soreness or pain after last session.    Pertinent History Pt complains of neck and bilateral shoulder pain for the last 10 years. "If I don't take my Lyrica it hurts so bad that I cry." She also complains of constant headaches on the crown of her head. She takes a lot of Excedrin for the headaches. She also complains of bilateral shoulder pain which is independent from her neck. History of 3 rotator cuffs on her R shoulder and is currently unable to lift her R arm. She has been advised that  she needs surgery for her shoulder but she is not interested in surgical intervention at this time. She has a history of chronic back pain and reports extensive spinal fusions. "I'm all fused except for my neck." She reports a remote cervical fusion as well but does not recall which level. She complains of chronic R hip pain which prevents her from laying on her R side. She has had bilateral TKR and states that this caused a leg length difference in her lower extremities. Currently she wears a lift in her R shoe. She has a lot of R knee instability and wears a brace on her R knee to prevent it from buckling. Pt has had two to three falls in the last 6 months and many "almost falls." She uses and single point cane in RUE when out in the community and uses a rollator at home. History of physical therapy after her bilateral TKR and her spinal fusions. She also had an evaluation for her gait/balance in [place] but due to [place] pandemic was unable to return for treatment. While living in [place] she was getting trigger point injections for her neck pain which were helpful however since moving to [facility] she has not  had any injections. She has had spinal injections in the past as well with very brief relief. History of R breast cancer s/p bilateral mastectomy. She has RUE lymphedema and pt has a home compression unit but she doesn't use it because she states that it doesn't help.    Diagnostic tests See history    Patient Stated Goals Decrease neck pain, improve R knee stability/gait/balance                TREATMENT   Ther-ex  Standing 3# AW 2 x 30s BLE Standing hamstring curls 3# AW 2 x 10 BLE Standing hip extension 3# AW 2 x 10 BLE Seated bicep curls 3# dumbbells 2 x 10 Seated PVC pipe shoulder flexion x 10 hurt at the end in shoulder Seated LAQ 3# AW x 30s Standing forward and backward walking in // bars x 5 lengths each direction Standing PVC triceps push down with manual resistance  from therapist x 20 Standing PVC chest press with manual resistance from therapist x 17 stopped due to pain Standing PVC rows with manual resistance from therapist x 20   Manual Therapy  Upper trap stretch 2 x bilaterally hold 30s Neck flexion stretch 2 x 30s Light STM to bilateral posterior cervical paraspinals, upper traps, and levator scapulae bilaterally, continued monitoring for patient response during intervention to assure no increase in pain.  Patient denies any increase in pain during manual techniques.   Pt educated throughout session about proper posture and technique with exercises. Improved exercise technique, movement at target joints, use of target muscles after min to mod verbal, visual, tactile cues.    Patient demonstrated excellent motivation today during session. Worked on much more standing exercises today for both strengthening, endurance and balance. Progressed from seated marches to standing marches in order to practice stabilization and balance in addition to strengthening. Due to the amount of standing and walking exercises pt needed more frequent breaks, but recovered quickly. Pt denied any new pain or soreness throughout the session. Pt would benefit from further PT services to continue to address ROM and strength deficits and pain.          PT Short Term Goals - 01/23/21 1423       PT SHORT TERM GOAL #1   Title Pt will be independent with HEP in order to decrease neck pain and improve balance/gait to improve pain-free function at home and decrease risk for falls    Time 6    Period Weeks    Status On-going    Target Date 12/28/20               PT Long Term Goals - 01/23/21 1423       PT LONG TERM GOAL #1   Title Pt will demonstrate decrease in NDI by at least 19% in order to demonstrate clinically significant reduction in disability related to neck pain    Baseline 08/30/20: 58%; 10/11/20: 64%;11/15/20: 42%    Time 12    Period Weeks    Status  Partially Met    Target Date 02/07/21      PT LONG TERM GOAL #2   Title Pt will decrease worst neck pain as reported on NPRS by at least 2 points in order to demonstrate clinically significant reduction in back pain.    Baseline 08/30/20: worst 8/10; 10/11/20: 8/10; 11/15/20: 8/10;    Time 12    Period Weeks    Status On-going    Target Date 02/07/21  PT LONG TERM GOAL #3   Title Pt will report at least 30% improvement in her neck symptoms in order to improve pain-free function at hom    Baseline 10/11/20: No change; 11/15/20: 5-6%    Time 12    Period Weeks    Status Partially Met    Target Date 02/07/21      PT LONG TERM GOAL #4   Title Pt will increase self-selected 10MWT to at least 0.6 m/s in order to demonstrate clinically significant improvement in household and community ambulation    Baseline 09/04/20: self-selected: 26.8s = 0.37 m/s; 10/11/20: 17.6s = 0.57 m/s; 11/15/20: 16.6s =  0.53ms    Time 8    Period Weeks    Status Revised    Target Date 02/07/21      PT LONG TERM GOAL #5   Title Pt will improve BERG to at least 49/56 in order to demonstrate clinically significant improvement in balance and lower risk for falls.    Baseline 09/04/20: 40/56; 10/11/20: 46/56; 11/15/20: 48/56    Time 8    Period Weeks    Status Partially Met    Target Date 02/07/21                   Plan - 02/06/21 1157     Clinical Impression Statement Patient demonstrated excellent motivation today during session. Worked on much more standing exercises today for both strengthening, endurance and balance. Progressed from seated marches to standing marches in order to practice stabilization and balance in addition to strengthening. Due to the amount of standing and walking exercises pt needed more frequent breaks, but recovered quickly. Pt denied any new pain or soreness throughout the session. Pt would benefit from further PT services to continue to address ROM and strength deficits and pain.     Personal Factors and Comorbidities Age;Comorbidity 3+;Past/Current Experience;Time since onset of injury/illness/exacerbation    Comorbidities cervical fusion, lumbar fusion, HTN, chronic pain, TKR, R shoulder RTC tear s/p surgery    Examination-Activity Limitations Bend;Hygiene/Grooming;Dressing;Lift;Reach Overhead;Squat;Stairs;Stand    Examination-Participation Restrictions Cleaning;Community Activity;Laundry;Meal Prep;Shop    Stability/Clinical Decision Making Unstable/Unpredictable    Rehab Potential Fair    PT Frequency 2x / week    PT Duration 12 weeks    PT Treatment/Interventions ADLs/Self Care Home Management;Aquatic Therapy;Biofeedback;Canalith Repostioning;Cryotherapy;Electrical Stimulation;Iontophoresis 451mml Dexamethasone;Moist Heat;Traction;Ultrasound;DME Instruction;Gait training;Stair training;Functional mobility training;Therapeutic activities;Therapeutic exercise;Balance training;Neuromuscular re-education;Patient/family education;Manual techniques;Passive range of motion;Dry needling;Vestibular;Spinal Manipulations;Joint Manipulations    PT Next Visit Plan Update outcome measures, goals, recertification?, LE strengthening, balance, neck modalities/strengthening    PT Home Exercise Plan Access Code: [email or web address]  Consulted and Agree with Plan of Care Patient                      Patient will benefit from skilled therapeutic intervention in order to improve the following deficits and impairments:  Abnormal gait, Decreased balance, Decreased strength, Pain  Visit Diagnosis: Cervicalgia  Muscle weakness (generalized)     Problem List Patient Active Problem List   Diagnosis Date Noted   UTI (urinary tract infection) 08/03/2020   Chronic back pain    Hypertension    GERD (gastroesophageal reflux disease)     Abigail Piecenski SPT JaLyndel Safeuprich PT, DPT, GCS  Huprich,Jason 02/07/2021, 12:32 PM  [place] [facility]MeW[place]hone: [phone] Fax:  [phone]: Marilyn LysneRN: [id number] of Birth:  Feb 24, 1945

## 2021-02-11 ENCOUNTER — Other Ambulatory Visit: Payer: Self-pay

## 2021-02-11 ENCOUNTER — Ambulatory Visit: Payer: Medicare Other

## 2021-02-11 DIAGNOSIS — M6281 Muscle weakness (generalized): Secondary | ICD-10-CM | POA: Diagnosis not present

## 2021-02-11 DIAGNOSIS — M542 Cervicalgia: Secondary | ICD-10-CM

## 2021-02-11 NOTE — Therapy (Addendum)
[facility] [address] [place], [place], [address] Phone: [phone]   Fax:  [phone]  Physical Therapy Note/Discharge   Patient Details  Name: Marilyn Keith MRN: [id number] Date of Birth: 1945-03-31 Referring Provider (PT): Bubba Camp FNP    Encounter Date: 02/11/2021   PT End of Session - 02/11/21 1149     Visit Number 35    Number of Visits 50    Date for PT Re-Evaluation 02/18/21    Authorization Type Medicare (traditional); BCBS secondary    Authorization Time Period 11/15/20-02/07/21    PT Start Time 1150    PT Stop Time 1225    PT Time Calculation (min) 35 min    Equipment Utilized During Treatment Gait belt    Activity Tolerance Patient tolerated treatment well    Behavior During Therapy WFL for tasks assessed/performed                  Past Medical History:  Diagnosis Date   Chronic back pain    GERD (gastroesophageal reflux disease)    Hypertension     Past Surgical History:  Procedure Laterality Date   BACK SURGERY     EYE SURGERY     MASTECTOMY     REPLACEMENT TOTAL KNEE BILATERAL     SHOULDER SURGERY Right     There were no vitals filed for this visit.      Subjective Assessment - 02/11/21 1149     Subjective Pt states she is having some new pain in her R foot after walking around in [employer] on Saturday. Reports it feels like someone is taking a sledge hammer to her foot. Pain happens randomly and has started happening less and less since Saturday. No new soreness after last session.    Pertinent History Pt complains of neck and bilateral shoulder pain for the last 10 years. "If I don't take my Lyrica it hurts so bad that I cry." She also complains of constant headaches on the crown of her head. She takes a lot of Excedrin for the headaches. She also complains of bilateral shoulder pain which is independent from her neck. History of 3 rotator cuffs on her R shoulder and is  currently unable to lift her R arm. She has been advised that she needs surgery for her shoulder but she is not interested in surgical intervention at this time. She has a history of chronic back pain and reports extensive spinal fusions. "I'm all fused except for my neck." She reports a remote cervical fusion as well but does not recall which level. She complains of chronic R hip pain which prevents her from laying on her R side. She has had bilateral TKR and states that this caused a leg length difference in her lower extremities. Currently she wears a lift in her R shoe. She has a lot of R knee instability and wears a brace on her R knee to prevent it from buckling. Pt has had two to three falls in the last 6 months and many "almost falls." She uses and single point cane in RUE when out in the community and uses a rollator at home. History of physical therapy after her bilateral TKR and her spinal fusions. She also had an evaluation for her gait/balance in [place] but due to [place] pandemic was unable to return for treatment. While living in [place] she was getting trigger point injections for her neck pain which were helpful however since moving to  [facility] she has not had any injections. She has had spinal injections in the past as well with very brief relief. History of R breast cancer s/p bilateral mastectomy. She has RUE lymphedema and pt has a home compression unit but she doesn't use it because she states that it doesn't help.    Diagnostic tests See history    Patient Stated Goals Decrease neck pain, improve R knee stability/gait/balance                TREATMENT   Ther-ex  Nu-step L2 x 29mn while getting intermittent history  Updated outcome measures with patient: NPRS: 7-8/10 worst, 0/10 best, 3-4/10 present NDI: 50% BERG: 46/56 10MWT: 16.0s = 0.63 m/s % change in neck pain: 5-6% HEP updated and reviewed with patient;   Re-evaluated goals to track progress at session  today. Progress towards goals has approached a plateau and pt states she feels like she has reached the peak of her results she is able to gain from physical therapy. NPRS worst pain level, NDI score, % change in neck pain, BERG and 10MWT did not significantly improve from the last recorded measurement on 11/15/20. Pt to discontinue PT due to maximal results being reached. Pt instructed to continue with HEP and return for re-evaluation if symptoms worsen.             PT Short Term Goals - 02/12/21 1521       PT SHORT TERM GOAL #1   Title Pt will be independent with HEP in order to decrease neck pain and improve balance/gait to improve pain-free function at home and decrease risk for falls    Time 6    Period Weeks    Status Achieved               PT Long Term Goals - 02/12/21 1521       PT LONG TERM GOAL #1   Title Pt will demonstrate decrease in NDI by at least 19% in order to demonstrate clinically significant reduction in disability related to neck pain    Baseline 08/30/20: 58%; 10/11/20: 64%;11/15/20: 42%; 02/11/21: 50%    Time 12    Period Weeks    Status Partially Met    Target Date --      PT LONG TERM GOAL #2   Title Pt will decrease worst neck pain as reported on NPRS by at least 2 points in order to demonstrate clinically significant reduction in back pain.    Baseline 08/30/20: worst 8/10; 10/11/20: 8/10; 11/15/20: 8/10; 02/11/21: 8/10;    Time 12    Period Weeks    Status Not Met    Target Date --      PT LONG TERM GOAL #3   Title Pt will report at least 30% improvement in her neck symptoms in order to improve pain-free function at hom    Baseline 10/11/20: No change; 11/15/20: 5-6%; 02/11/21: 5-6%    Time 12    Period Weeks    Status Partially Met    Target Date --      PT LONG TERM GOAL #4   Title Pt will increase self-selected 10MWT to at least 0.6 m/s in order to demonstrate clinically significant improvement in household and community ambulation     Baseline 09/04/20: self-selected: 26.8s = 0.37 m/s; 10/11/20: 17.6s = 0.57 m/s; 11/15/20: 16.6s =  0.646m; 02/11/21: 16.0s = 0.63 m/s    Time 8    Period Weeks  Status Achieved    Target Date --      PT LONG TERM GOAL #5   Title Pt will improve BERG to at least 49/56 in order to demonstrate clinically significant improvement in balance and lower risk for falls.    Baseline 09/04/20: 40/56; 10/11/20: 46/56; 11/15/20: 48/56; 02/11/21: 46/56;    Time 8    Period Weeks    Status Partially Met    Target Date --                   Plan - 02/11/21 1155     Clinical Impression Statement Re-evaluated goals to track progress at session today. Progress towards goals has approached a plateau and pt states she feels like she has reached the peak of her results she is able to gain from physical therapy. NPRS worst pain level, NDI score, % change in neck pain, BERG and 10MWT did not significantly improve from the last recorded measurement on 11/15/20. Pt to discontinue PT due to maximal results being reached. Pt instructed to continue with HEP and return for re-evaluation if symptoms worsen.    Personal Factors and Comorbidities Age;Comorbidity 3+;Past/Current Experience;Time since onset of injury/illness/exacerbation    Comorbidities cervical fusion, lumbar fusion, HTN, chronic pain, TKR, R shoulder RTC tear s/p surgery    Examination-Activity Limitations Bend;Hygiene/Grooming;Dressing;Lift;Reach Overhead;Squat;Stairs;Stand    Examination-Participation Restrictions Cleaning;Community Activity;Laundry;Meal Prep;Shop    Stability/Clinical Decision Making Unstable/Unpredictable    Rehab Potential Fair    PT Frequency 1x / week    PT Duration --   1 week   PT Treatment/Interventions ADLs/Self Care Home Management;Aquatic Therapy;Biofeedback;Canalith Repostioning;Cryotherapy;Electrical Stimulation;Iontophoresis 77m/ml Dexamethasone;Moist Heat;Traction;Ultrasound;DME Instruction;Gait training;Stair  training;Functional mobility training;Therapeutic activities;Therapeutic exercise;Balance training;Neuromuscular re-education;Patient/family education;Manual techniques;Passive range of motion;Dry needling;Vestibular;Spinal Manipulations;Joint Manipulations    PT Next Visit Plan Discharge    PT Home Exercise Plan Access Code: [email or web address]   Consulted and Agree with Plan of Care Patient                      Patient will benefit from skilled therapeutic intervention in order to improve the following deficits and impairments:  Abnormal gait, Decreased balance, Decreased strength, Pain  Visit Diagnosis: Cervicalgia  Muscle weakness (generalized)     Problem List Patient Active Problem List   Diagnosis Date Noted   UTI (urinary tract infection) 08/03/2020   Chronic back pain    Hypertension    GERD (gastroesophageal reflux disease)     Donis Kotowski SPT JLyndel SafeHuprich PT, DPT, GCS  Huprich,Jason 02/12/2021, 3:26 PM  [place] [facility] [place] [address]Phone: [phone]  Fax:  [phone] Name: HAntigone CrowellMRN: [id number] of Birth: 806-18-1947

## 2021-07-12 ENCOUNTER — Other Ambulatory Visit: Payer: Self-pay | Admitting: Orthopedic Surgery

## 2021-07-12 DIAGNOSIS — S83411A Sprain of medial collateral ligament of right knee, initial encounter: Secondary | ICD-10-CM

## 2021-07-12 DIAGNOSIS — Z96651 Presence of right artificial knee joint: Secondary | ICD-10-CM

## 2021-07-12 DIAGNOSIS — M25561 Pain in right knee: Secondary | ICD-10-CM

## 2021-07-18 ENCOUNTER — Emergency Department: Payer: No Typology Code available for payment source

## 2021-07-18 ENCOUNTER — Other Ambulatory Visit: Payer: Self-pay

## 2021-07-18 ENCOUNTER — Encounter
Admission: RE | Admit: 2021-07-18 | Discharge: 2021-07-18 | Disposition: A | Discharge status: Home / Self Care | Payer: Medicare Other | Source: Ambulatory Visit | Attending: Orthopedic Surgery | Admitting: Orthopedic Surgery

## 2021-07-18 ENCOUNTER — Emergency Department
Admission: EM | Admit: 2021-07-18 | Discharge: 2021-07-18 | Disposition: A | Discharge status: Home / Self Care | Payer: No Typology Code available for payment source | Attending: Emergency Medicine | Admitting: Emergency Medicine

## 2021-07-18 DIAGNOSIS — M25561 Pain in right knee: Secondary | ICD-10-CM | POA: Insufficient documentation

## 2021-07-18 DIAGNOSIS — Z96651 Presence of right artificial knee joint: Secondary | ICD-10-CM | POA: Insufficient documentation

## 2021-07-18 DIAGNOSIS — M25511 Pain in right shoulder: Secondary | ICD-10-CM | POA: Diagnosis present

## 2021-07-18 DIAGNOSIS — M25531 Pain in right wrist: Secondary | ICD-10-CM | POA: Insufficient documentation

## 2021-07-18 DIAGNOSIS — Y92410 Unspecified street and highway as the place of occurrence of the external cause: Secondary | ICD-10-CM | POA: Diagnosis not present

## 2021-07-18 DIAGNOSIS — M542 Cervicalgia: Secondary | ICD-10-CM | POA: Insufficient documentation

## 2021-07-18 DIAGNOSIS — S83411A Sprain of medial collateral ligament of right knee, initial encounter: Secondary | ICD-10-CM | POA: Insufficient documentation

## 2021-07-18 DIAGNOSIS — M7918 Myalgia, other site: Secondary | ICD-10-CM

## 2021-07-18 MED ORDER — IBUPROFEN 400 MG PO TABS
400.0000 mg | ORAL_TABLET | Freq: Once | ORAL | Status: AC
  Administered 2021-07-18: 400 mg via ORAL
  Filled 2021-07-18: qty 1

## 2021-07-18 MED ORDER — ACETAMINOPHEN 500 MG PO TABS
1000.0000 mg | ORAL_TABLET | Freq: Once | ORAL | Status: AC
  Administered 2021-07-18: 1000 mg via ORAL
  Filled 2021-07-18: qty 2

## 2021-07-18 MED ORDER — TECHNETIUM TC 99M MEDRONATE IV KIT
20.0000 | PACK | Freq: Once | INTRAVENOUS | Status: AC | PRN
Start: 2021-07-18 — End: 2021-07-18
  Administered 2021-07-18: 20.49 via INTRAVENOUS
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
## 2021-07-18 NOTE — ED Notes (Signed)
Pt taken for CT 
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
## 2021-11-04 ENCOUNTER — Ambulatory Visit: Payer: Medicare Other | Attending: Physician Assistant

## 2021-11-04 DIAGNOSIS — M25561 Pain in right knee: Secondary | ICD-10-CM | POA: Diagnosis present

## 2021-11-04 DIAGNOSIS — M6281 Muscle weakness (generalized): Secondary | ICD-10-CM | POA: Insufficient documentation

## 2021-11-04 DIAGNOSIS — G8929 Other chronic pain: Secondary | ICD-10-CM | POA: Diagnosis present
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
## 2021-11-10 NOTE — Therapy (Signed)
OUTPATIENT PHYSICAL THERAPY KNEE TREATMENT  Patient Name: Marilyn Keith MRN: [id number] DOB:[birth year], 76 y.o., female Today's Date: 11/11/2021   PT End of Session - 11/11/21 0901     Visit Number 2    Number of Visits 17    Date for PT Re-Evaluation 12/30/21    Authorization Type eval: 11/04/21;    PT Start Time 0847    PT Stop Time 0930    PT Time Calculation (min) 43 min    Equipment Utilized During Treatment Gait belt    Activity Tolerance Patient tolerated treatment well    Behavior During Therapy WFL for tasks assessed/performed            Past Medical History:  Diagnosis Date   Chronic back pain    GERD (gastroesophageal reflux disease)    Hypertension    Past Surgical History:  Procedure Laterality Date   BACK SURGERY     EYE SURGERY     MASTECTOMY     REPLACEMENT TOTAL KNEE BILATERAL     SHOULDER SURGERY Right    Patient Active Problem List   Diagnosis Date Noted   UTI (urinary tract infection) 08/03/2020   Chronic back pain    Hypertension    GERD (gastroesophageal reflux disease)     PCP: Jenell MillinerLuyando, Yvonne, MD  REFERRING PROVIDER: Paul HalfSpinrad, Kyle Scott, PA*  REFERRING DIAGNOSIS: Pain due to total right knee replacement, initial encounter (CMS-HCC) T84.84XA  Z96.651   THERAPY DIAG: Chronic pain of right knee  Muscle weakness (generalized)  RATIONALE FOR EVALUATION AND TREATMENT: Rehabilitation  ONSET DATE: 08/04/21 (acute on chronic, approximate)  FOLLOW UP APPT WITH PROVIDER: Yes, MD recommended follow-up after at least 4 weeks of PT.   FROM INITIAL EVALUATION SUBJECTIVE:                                                                                                                                                                                         Chief Complaint: R knee instability  Pertinent History Pt reports that she fell a few months ago and states "I think I tore the other ligament in my right knee." She fell down her driveway  when taking her recycling can down to the street. She underwent a right total knee arthroplasty about 10-12 years ago [place] [facility] [place] [place] Patient relates that surgeon had a difficult time with her surgery as result of the severe valgus osteoarthritis in addition to poor bone quality due to treatments for breast cancer (hx of bilateral mastectomy). 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  She complains of significant instability in her right knee for at least the past 5 years but symptoms have worsened recently. She has been experiencing repeated dislocations in her R knee recently and has had 3-4 falls in the last 6 months. 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 She arrives wearing a hinged R knee brace and ambulating with a single point cane in RUE. She is also wearing a lace up R ankle brace due to recent severe foot pain for which she saw podiatry. She is using a rollator at all times at home. Pt has a history of chronic neck, low back, and bilateral shoulder pain with significant R shoulder weakness (hx of 3 RTC surgeries RUE).  Pain:  Pain Intensity: Present: 0/10, Best: 0/10, Worst: 4/10 Pain location: Medial and lateral joint line Pain quality:  "Intolerable"   Radiating pain: Yes down the RLE Swelling: No  Popping, catching, locking: Yes  Numbness/Tingling: Yes, bilateral feet and fingers Focal weakness or buckling: Yes Aggravating factors: resting on knees, "going too far sideways with the knee" Relieving factors: resting, pt reports she has to relax her muscles for her knee to reduce when it dislocates 24-hour pain behavior: varies depending on activity History of prior back, hip, or knee injury, pain, surgery, or therapy: Yes Falls: Has patient fallen in last 6 months? Yes, Number of  falls: 3-4 falls Dominant hand: right Imaging: No, pt denies any recent imaging of R knee  Prior level of function: Independent with basic ADLs, assist with IADLs Occupational demands: retired  Precautions: None  Weight Bearing Restrictions: No  Living Environment Lives with: lives alone Lives in: House/apartment, lives on one level, adjustable bed, WI shower with grab bars with seat, single point cane and rollator, ramp to enter  Patient Goals: Improve RLE stability   OBJECTIVE:   Patient Surveys  FOTO 43, predicted improvement 75  Cognition Patient is oriented to person, place, and time.  Recent memory is intact.  Remote memory is intact.  Attention span and concentration are intact.  Expressive speech is intact.  Patient's fund of knowledge is within normal limits for educational level.    Gross Musculoskeletal Assessment Tremor: None Bulk: Normal Tone: Normal  GAIT: Distance walked: 100' to enter/exit clinic Assistive device utilized: Single point cane Level of assistance: Modified independence Comments: Pt ambulates with single point cane in RUE.  Decreased self-selected gait speed and decreased step length bilaterally. Decreased stance time on RLE  Posture: Deferred full posture assessment. Pt with limited spinal mobility related to multiple spinal fusions.  AROM AROM (Normal range in degrees) AROM  11/04/2021  Lumbar   Flexion (65)   Extension (30)   Right lateral flexion (25)   Left lateral flexion (25)   Right rotation (30)   Left rotation (30)       Hip Right Left  Flexion (125) WNL WNL  Extension (15)    Abduction (40)    Adduction     Internal Rotation (45)    External Rotation (45)        Knee    Flexion (135) 129 125   Extension (0) 14 hyperextension 11 hyperextension      Ankle    Dorsiflexion (20)    Plantarflexion (50)    Inversion (35)    Eversion (15    (* = pain; Blank rows = not  tested)  LE MMT: MMT (out of 5)  Right 11/11/2021 Left 11/11/2021  Hip flexion 5 5  Hip extension    Hip abduction    Hip adduction    Hip internal rotation    Hip external rotation    Knee flexion 5 5  Knee extension 5 5  Ankle dorsiflexion 5 5  Ankle plantarflexion    Ankle inversion    Ankle eversion    (* = pain; Blank rows = not tested)  Sensation Deferred on this date.  Reflexes Deferred  Muscle Length Hamstrings: R: Not examined L: Not examined Quadriceps Michela Pitcher): R: Not examined L: Not examined Hip flexors Maisie Fus): R: Not examined L: Not examined IT band Claiborne Rigg): R: Not examined L: Not examined  Palpation Location LEFT  RIGHT           Quadriceps 0   Medial Hamstrings 0   Lateral Hamstrings 0   Lateral Hamstring tendon 0   Medial Hamstring tendon 0   Quadriceps tendon 0   Patella 0   Patellar Tendon 0   Tibial Tuberosity 0   Medial joint line 1   Lateral joint line 1   MCL 1   LCL 1   Adductor Tubercle    Pes Anserine tendon    Infrapatellar fat pad    Fibular head 0   Popliteal fossa    (Blank rows = not tested) Graded on 0-4 scale (0 = no pain, 1 = pain, 2 = pain with wincing/grimacing/flinching, 3 = pain with withdrawal, 4 = unwilling to allow palpation), (Blank rows = not tested)  Passive Accessory Motion Deferred  VASCULAR: Deferred  SPECIAL TESTS  Ligamentous Stability  ACL: Lachman's: R: Positive L: Negative Active Lachman's: R: Not examined L: Not examined Anterior Drawer: R: Negative L: Negative Pivot Shift: R: Negative L: Negative  PCL: Posterior Drawer: R: Negative L: Negative Reverse Lachman's: R: Negative L: Negative Posterior Sag Sign: R: Negative L: Not examined  MCL: Valgus Stress (30 degrees flexion): R: Positive for laxity and minimal pain L: Not examined  LCL: Varus Stress (30 degrees flexion): R: Positive for laxity and minimal pain L: Not examined  Meniscus Tests McMurray's Test:  Medial Meniscus (Tibial ER): R: Not examined L: Not  examined Lateral Meniscus (Tibial IR): R: Not examined L: Not examined Thessaly: R: Not examined L: Not examined Steinmann Sign I: R: Not examined L: Not examined Steinmann Sign II: R: Not examined L: Not examined  Patellofemoral Pain Syndrome Patellar Tilt (Lateral): R: Negative L: Not examined Patellar Apprehension: R: Negative L: Not examined Squatting pain: R: Not examined L: Not examined Stair climbing pain: R: Not examined L: Not examined Kneeling pain: R: Not examined L: Not examined Resisted knee extension pain: R: Negative L: Negative Compression: R: Negative L: Not examined Clarke's sign: R: Not examined L: Not examined Lateral Pull: R: Not examined L: Not examined  Patellar Tendinopathy Inferior pole palpation with anterior tilt: R: Negative L: Not examined   Beighton Scale: Deferred  [place] Knee Rules for Acute Knee injury  Yes/No         1. Aged 55 years or over No   2. Tenderness at the head of the fibula No   3. Isolated tenderness of the patella  No   4. Inability to flex knee to 90 degrees No   5. Inability to bear weight (defined as an inability to take 4 steps, ie two steps on each leg, regardless of limping) immediately and at  presentation No     TODAY'S TREATMENT    Ther-ex  NuStep L1 x 6 minutes BLE only for strengthening, discontinued at 6 minutes secondary to pain; Supine RLE quad set 5s hold 2 x 10; Supine RLE SLR initiating with quad set 2 x 10; Supine RLE straight leg hip abduction 2 x 10 BLE, with manual resistance;  Supine RLE straight leg hip adduction 2 x 10 BLE, with manual resistance; Supine RLE heel slides with manually resisted extension 2 x 10; Hooklying RLE SAQ over bolster 2 x 10 BLE, with manual resistance; Hooklying clams x 10 BLE, with manual resistance; Hooklying adductor squeeze 2 x 10 BLE, with manual resistance; Hooklying bridges with arms at side 2 x 10; HEP issued and reviewed with patient;   PATIENT EDUCATION:  Education  details: Pt educated throughout session about proper posture and technique with exercises. Improved exercise technique, movement at target joints, use of target muscles after min to mod verbal, visual, tactile cues. HEP Person educated: Patient Education method: Explanation, verbal cues, tactile cues, and handout Education comprehension: verbalized understanding and returned demonstration   HOME EXERCISE PROGRAM: Access Code: [email or web address] URL: [email or web address] Date: 11/11/2021 Prepared by: Ria Comment  Exercises - Supine Active Straight Leg Raise  - 1 x daily - 7 x weekly - 2 sets - 10 reps - 3s hold - Supine Quad Set (Mirrored)  - 1 x daily - 7 x weekly - 2 sets - 10 reps - 3s hold - Supine Bridge  - 1 x daily - 7 x weekly - 2 sets - 5 reps - 3s hold - Seated Hip Abduction with Resistance  - 1 x daily - 7 x weekly - 2 sets - 10 reps - 3s hold   ASSESSMENT:  CLINICAL IMPRESSION: Initiated pain-free RLE strengthening during session today. Utilized mostly nonweightbearing positions through the R knee joint to minimize discomfort. Care taken to provide resistance above the knee with lateral motions to avoid worsening medial to lateral instability. HEP issued and reviewed with patient. Plan is to continue progressive RLE strengthening at future visit. Patient will benefit from skilled PT to address above impairments and improve overall function.  REHAB POTENTIAL: Fair    CLINICAL DECISION MAKING: Unstable/unpredictable  EVALUATION COMPLEXITY: High   GOALS: Goals reviewed with patient? No  SHORT TERM GOALS: Target date: 12/02/2021  Pt will be independent with HEP to improve strength and decrease knee pain to improve pain-free function at home. Baseline:  Goal status: INITIAL   LONG TERM GOALS: Target date: 12/30/2021  Pt will increase FOTO to at least 53 to demonstrate significant improvement in function at home and work related to knee pain  Baseline:  11/04/21: 43 Goal status: INITIAL  2.  Pt will decrease worst knee pain by at least 3 points on the NPRS in order to demonstrate clinically significant reduction in knee pain. Baseline: 11/04/21: worst: 4/10 Goal status: INITIAL  3.  Pt will report no further R knee buckling or falls in order to demonstrate improvement in strength and function and decrease risk for injury related to falls Baseline:  Goal status: INITIAL   PLAN: PT FREQUENCY: 1-2x/week  PT DURATION: 8 weeks  PLANNED INTERVENTIONS: Therapeutic exercises, Therapeutic activity, Neuromuscular re-education, Balance training, Gait training, Patient/Family education, Joint manipulation, Joint mobilization, Canalith repositioning, Aquatic Therapy, Dry Needling, Electrical stimulation, Spinal manipulation, Spinal mobilization, Cryotherapy, Moist heat, Taping, Traction, Ultrasound, Ionotophoresis 4mg /ml Dexamethasone, and Manual therapy  PLAN FOR NEXT SESSION: Progress strengthening for RLE, review and  modify HEP as necessary  Sharalyn Ink Latarshia Jersey PT, DPT, GCS  Darral Rishel 11/11/2021, 10:50 AM

## 2021-11-11 ENCOUNTER — Ambulatory Visit: Payer: Medicare Other

## 2021-11-11 DIAGNOSIS — G8929 Other chronic pain: Secondary | ICD-10-CM

## 2021-11-11 DIAGNOSIS — M25561 Pain in right knee: Secondary | ICD-10-CM | POA: Diagnosis not present

## 2021-11-11 DIAGNOSIS — M6281 Muscle weakness (generalized): Secondary | ICD-10-CM

## 2021-11-11 NOTE — Therapy (Incomplete)
OUTPATIENT PHYSICAL THERAPY KNEE TREATMENT  Patient Name: Marilyn Keith MRN: [id number] DOB:Feb 27, 1945, 76 y.o., female Today's Date: 11/11/2021    Past Medical History:  Diagnosis Date   Chronic back pain    GERD (gastroesophageal reflux disease)    Hypertension    Past Surgical History:  Procedure Laterality Date   BACK SURGERY     EYE SURGERY     MASTECTOMY     REPLACEMENT TOTAL KNEE BILATERAL     SHOULDER SURGERY Right    Patient Active Problem List   Diagnosis Date Noted   UTI (urinary tract infection) 08/03/2020   Chronic back pain    Hypertension    GERD (gastroesophageal reflux disease)     PCP: Danae Orleans, MD  REFERRING PROVIDER: Danae Orleans, MD  REFERRING DIAGNOSIS: Pain due to total right knee replacement, initial encounter (CMS-HCC) T84.84XA  Z96.651   THERAPY DIAG: Chronic pain of right knee  Muscle weakness (generalized)  RATIONALE FOR EVALUATION AND TREATMENT: Rehabilitation  ONSET DATE: 08/04/21 (acute on chronic, approximate)  FOLLOW UP APPT WITH PROVIDER: Yes, MD recommended follow-up after at least 4 weeks of PT.   FROM INITIAL EVALUATION SUBJECTIVE:                                                                                                                                                                                         Chief Complaint: R knee instability  Pertinent History Pt reports that she fell a few months ago and states "I think I tore the other ligament in my right knee." She fell down her driveway when taking her recycling can down to the street. She underwent a right total knee arthroplasty about 10-12 years ago at an outside facility in [facility]. Patient relates that surgeon had a difficult time with her surgery as result of the severe valgus osteoarthritis in addition to poor bone quality due to treatments for breast cancer (hx of bilateral mastectomy). 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  She complains of significant instability in her right knee for at least the past 5 years but symptoms have worsened recently. She has been experiencing repeated dislocations in her R knee recently and has had 3-4 falls in the last 6 months. 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 She arrives wearing a hinged R knee brace and ambulating with a single point cane in RUE. She is also wearing a lace up R ankle  brace due to recent severe foot pain for which she saw podiatry. She is using a rollator at all times at home. Pt has a history of chronic neck, low back, and bilateral shoulder pain with significant R shoulder weakness (hx of 3 RTC surgeries RUE).  Pain:  Pain Intensity: Present: 0/10, Best: 0/10, Worst: 4/10 Pain location: Medial and lateral joint line Pain quality:  "Intolerable"   Radiating pain: Yes down the RLE Swelling: No  Popping, catching, locking: Yes  Numbness/Tingling: Yes, bilateral feet and fingers Focal weakness or buckling: Yes Aggravating factors: resting on knees, "going too far sideways with the knee" Relieving factors: resting, pt reports she has to relax her muscles for her knee to reduce when it dislocates 24-hour pain behavior: varies depending on activity History of prior back, hip, or knee injury, pain, surgery, or therapy: Yes Falls: Has patient fallen in last 6 months? Yes, Number of falls: 3-4 falls Dominant hand: right Imaging: No, pt denies any recent imaging of R knee  Prior level of function: Independent with basic ADLs, assist with IADLs Occupational demands: retired  Precautions: None  Weight Bearing Restrictions: No  Living Environment Lives with: lives alone Lives in: House/apartment, lives on one level, adjustable bed, WI shower with grab bars with seat, single point cane and rollator,  ramp to enter  Patient Goals: Improve RLE stability   OBJECTIVE:   Patient Surveys  FOTO 43, predicted improvement 50  Cognition Patient is oriented to person, place, and time.  Recent memory is intact.  Remote memory is intact.  Attention span and concentration are intact.  Expressive speech is intact.  Patient's fund of knowledge is within normal limits for educational level.    Gross Musculoskeletal Assessment Tremor: None Bulk: Normal Tone: Normal  GAIT: Distance walked: 100' to enter/exit clinic Assistive device utilized: Single point cane Level of assistance: Modified independence Comments: Pt ambulates with single point cane in RUE.  Decreased self-selected gait speed and decreased step length bilaterally. Decreased stance time on RLE  Posture: Deferred full posture assessment. Pt with limited spinal mobility related to multiple spinal fusions.  AROM AROM (Normal range in degrees) AROM  11/04/2021  Lumbar   Flexion (65)   Extension (30)   Right lateral flexion (25)   Left lateral flexion (25)   Right rotation (30)   Left rotation (30)       Hip Right Left  Flexion (125) WNL WNL  Extension (15)    Abduction (40)    Adduction     Internal Rotation (45)    External Rotation (45)        Knee    Flexion (135) 129 125   Extension (0) 14 hyperextension 11 hyperextension      Ankle    Dorsiflexion (20)    Plantarflexion (50)    Inversion (35)    Eversion (15    (* = pain; Blank rows = not tested)  LE MMT: MMT (out of 5) Right 11/11/2021 Left 11/11/2021  Hip flexion 5 5  Hip extension    Hip abduction    Hip adduction    Hip internal rotation    Hip external rotation    Knee flexion 5 5  Knee extension 5 5  Ankle dorsiflexion 5 5  Ankle plantarflexion    Ankle inversion    Ankle eversion    (* = pain; Blank rows = not tested)  Sensation Deferred on this date.  Reflexes Deferred  Muscle Length Hamstrings: R: Not  examined L: Not  examined Quadriceps Michela Pitcher): R: Not examined L: Not examined Hip flexors Maisie Fus): R: Not examined L: Not examined IT band Claiborne Rigg): R: Not examined L: Not examined  Palpation Location LEFT  RIGHT           Quadriceps 0   Medial Hamstrings 0   Lateral Hamstrings 0   Lateral Hamstring tendon 0   Medial Hamstring tendon 0   Quadriceps tendon 0   Patella 0   Patellar Tendon 0   Tibial Tuberosity 0   Medial joint line 1   Lateral joint line 1   MCL 1   LCL 1   Adductor Tubercle    Pes Anserine tendon    Infrapatellar fat pad    Fibular head 0   Popliteal fossa    (Blank rows = not tested) Graded on 0-4 scale (0 = no pain, 1 = pain, 2 = pain with wincing/grimacing/flinching, 3 = pain with withdrawal, 4 = unwilling to allow palpation), (Blank rows = not tested)  Passive Accessory Motion Deferred  VASCULAR: Deferred  SPECIAL TESTS  Ligamentous Stability  ACL: Lachman's: R: Positive L: Negative Active Lachman's: R: Not examined L: Not examined Anterior Drawer: R: Negative L: Negative Pivot Shift: R: Negative L: Negative  PCL: Posterior Drawer: R: Negative L: Negative Reverse Lachman's: R: Negative L: Negative Posterior Sag Sign: R: Negative L: Not examined  MCL: Valgus Stress (30 degrees flexion): R: Positive for laxity and minimal pain L: Not examined  LCL: Varus Stress (30 degrees flexion): R: Positive for laxity and minimal pain L: Not examined  Meniscus Tests McMurray's Test:  Medial Meniscus (Tibial ER): R: Not examined L: Not examined Lateral Meniscus (Tibial IR): R: Not examined L: Not examined Thessaly: R: Not examined L: Not examined Steinmann Sign I: R: Not examined L: Not examined Steinmann Sign II: R: Not examined L: Not examined  Patellofemoral Pain Syndrome Patellar Tilt (Lateral): R: Negative L: Not examined Patellar Apprehension: R: Negative L: Not examined Squatting pain: R: Not examined L: Not examined Stair climbing pain: R: Not examined L: Not  examined Kneeling pain: R: Not examined L: Not examined Resisted knee extension pain: R: Negative L: Negative Compression: R: Negative L: Not examined Clarke's sign: R: Not examined L: Not examined Lateral Pull: R: Not examined L: Not examined  Patellar Tendinopathy Inferior pole palpation with anterior tilt: R: Negative L: Not examined   Beighton Scale: Deferred  [place] Knee Rules for Acute Knee injury  Yes/No         1. Aged 55 years or over No   2. Tenderness at the head of the fibula No   3. Isolated tenderness of the patella  No   4. Inability to flex knee to 90 degrees No   5. Inability to bear weight (defined as an inability to take 4 steps, ie two steps on each leg, regardless of limping) immediately and at presentation No     TODAY'S TREATMENT    Ther-ex  NuStep L1 x 6 minutes BLE only for strengthening, discontinued at 6 minutes secondary to pain; Supine RLE quad set 5s hold 2 x 10; Supine RLE SLR initiating with quad set 2 x 10; Supine RLE straight leg hip abduction 2 x 10 BLE, with manual resistance;  Supine RLE straight leg hip adduction 2 x 10 BLE, with manual resistance; Supine RLE heel slides with manually resisted extension 2 x 10; Hooklying RLE SAQ over bolster 2 x 10 BLE, with manual resistance;  Hooklying clams x 10 BLE, with manual resistance; Hooklying adductor squeeze 2 x 10 BLE, with manual resistance; Hooklying bridges with arms at side 2 x 10; HEP issued and reviewed with patient;   PATIENT EDUCATION:  Education details: Pt educated throughout session about proper posture and technique with exercises. Improved exercise technique, movement at target joints, use of target muscles after min to mod verbal, visual, tactile cues. HEP Person educated: Patient Education method: Explanation, verbal cues, tactile cues, and handout Education comprehension: verbalized understanding and returned demonstration   HOME EXERCISE PROGRAM: Access Code:  [email or web address] URL: [email or web address] Date: 11/11/2021 Prepared by: Ria Comment  Exercises - Supine Active Straight Leg Raise  - 1 x daily - 7 x weekly - 2 sets - 10 reps - 3s hold - Supine Quad Set (Mirrored)  - 1 x daily - 7 x weekly - 2 sets - 10 reps - 3s hold - Supine Bridge  - 1 x daily - 7 x weekly - 2 sets - 5 reps - 3s hold - Seated Hip Abduction with Resistance  - 1 x daily - 7 x weekly - 2 sets - 10 reps - 3s hold   ASSESSMENT:  CLINICAL IMPRESSION: Initiated pain-free RLE strengthening during session today. Utilized mostly nonweightbearing positions through the R knee joint to minimize discomfort. Care taken to provide resistance above the knee with lateral motions to avoid worsening medial to lateral instability. HEP issued and reviewed with patient. Plan is to continue progressive RLE strengthening at future visit. Patient will benefit from skilled PT to address above impairments and improve overall function.  REHAB POTENTIAL: Fair    CLINICAL DECISION MAKING: Unstable/unpredictable  EVALUATION COMPLEXITY: High   GOALS: Goals reviewed with patient? No  SHORT TERM GOALS: Target date: 12/02/2021  Pt will be independent with HEP to improve strength and decrease knee pain to improve pain-free function at home. Baseline:  Goal status: INITIAL   LONG TERM GOALS: Target date: 12/30/2021  Pt will increase FOTO to at least 53 to demonstrate significant improvement in function at home and work related to knee pain  Baseline: 11/04/21: 43 Goal status: INITIAL  2.  Pt will decrease worst knee pain by at least 3 points on the NPRS in order to demonstrate clinically significant reduction in knee pain. Baseline: 11/04/21: worst: 4/10 Goal status: INITIAL  3.  Pt will report no further R knee buckling or falls in order to demonstrate improvement in strength and function and decrease risk for injury related to falls Baseline:  Goal status:  INITIAL   PLAN: PT FREQUENCY: 1-2x/week  PT DURATION: 8 weeks  PLANNED INTERVENTIONS: Therapeutic exercises, Therapeutic activity, Neuromuscular re-education, Balance training, Gait training, Patient/Family education, Joint manipulation, Joint mobilization, Canalith repositioning, Aquatic Therapy, Dry Needling, Electrical stimulation, Spinal manipulation, Spinal mobilization, Cryotherapy, Moist heat, Taping, Traction, Ultrasound, Ionotophoresis 4mg /ml Dexamethasone, and Manual therapy  PLAN FOR NEXT SESSION: Progress strengthening for RLE, review and modify HEP as necessary  Sanford Lindblad PT, DPT, GCS  Daleon Willinger 11/11/2021, 1:37 PM

## 2021-11-13 DIAGNOSIS — M6281 Muscle weakness (generalized): Secondary | ICD-10-CM

## 2021-11-13 DIAGNOSIS — G8929 Other chronic pain: Secondary | ICD-10-CM

## 2021-11-14 ENCOUNTER — Ambulatory Visit: Payer: Medicare Other | Attending: Physician Assistant

## 2021-11-14 DIAGNOSIS — M6281 Muscle weakness (generalized): Secondary | ICD-10-CM | POA: Insufficient documentation

## 2021-11-14 DIAGNOSIS — M25561 Pain in right knee: Secondary | ICD-10-CM | POA: Diagnosis present

## 2021-11-14 DIAGNOSIS — G8929 Other chronic pain: Secondary | ICD-10-CM | POA: Diagnosis present

## 2021-11-14 NOTE — Therapy (Signed)
OUTPATIENT PHYSICAL THERAPY KNEE TREATMENT  Patient Name: Marilyn Keith MRN: [id number] DOB:[birth year], 76 y.o., female Today's Date: 11/14/2021   PT End of Session - 11/14/21 0900     Visit Number 3    Number of Visits 17    Date for PT Re-Evaluation 12/30/21    Authorization Type eval: 11/04/21;    PT Start Time 0848    PT Stop Time 0930    PT Time Calculation (min) 42 min    Equipment Utilized During Treatment Gait belt    Activity Tolerance Patient tolerated treatment well    Behavior During Therapy WFL for tasks assessed/performed            Past Medical History:  Diagnosis Date   Chronic back pain    GERD (gastroesophageal reflux disease)    Hypertension    Past Surgical History:  Procedure Laterality Date   BACK SURGERY     EYE SURGERY     MASTECTOMY     REPLACEMENT TOTAL KNEE BILATERAL     SHOULDER SURGERY Right    Patient Active Problem List   Diagnosis Date Noted   UTI (urinary tract infection) 08/03/2020   Chronic back pain    Hypertension    GERD (gastroesophageal reflux disease)    PCP: Jenell Milliner, MD  REFERRING PROVIDER: Paul Half, PA*  REFERRING DIAGNOSIS: Pain due to total right knee replacement, initial encounter (CMS-HCC) T84.84XA  Z96.651   THERAPY DIAG: Chronic pain of right knee  Muscle weakness (generalized)  RATIONALE FOR EVALUATION AND TREATMENT: Rehabilitation  ONSET DATE: 08/04/21 (acute on chronic, approximate)  FOLLOW UP APPT WITH PROVIDER: Yes, MD recommended follow-up after at least 4 weeks of PT.   FROM INITIAL EVALUATION SUBJECTIVE:                                                                                                                                                                                         Chief Complaint: R knee instability  Pertinent History Pt reports that she fell a few months ago and states "I think I tore the other ligament in my right knee." She fell down her driveway  when taking her recycling can down to the street. She underwent a right total knee arthroplasty about 10-12 years ago at an outside facility in [facility]. Patient relates that surgeon had a difficult time with her surgery as result of the severe valgus osteoarthritis in addition to poor bone quality due to treatments for breast cancer (hx of bilateral mastectomy). 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  She complains of significant instability in her right knee for at least the past 5 years but symptoms have worsened recently. She has been experiencing repeated dislocations in her R knee recently and has had 3-4 falls in the last 6 months. 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 She arrives wearing a hinged R knee brace and ambulating with a single point cane in RUE. She is also wearing a lace up R ankle brace due to recent severe foot pain for which she saw podiatry. She is using a rollator at all times at home. Pt has a history of chronic neck, low back, and bilateral shoulder pain with significant R shoulder weakness (hx of 3 RTC surgeries RUE).  Pain:  Pain Intensity: Present: 0/10, Best: 0/10, Worst: 4/10 Pain location: Medial and lateral joint line Pain quality:  "Intolerable"   Radiating pain: Yes down the RLE Swelling: No  Popping, catching, locking: Yes  Numbness/Tingling: Yes, bilateral feet and fingers Focal weakness or buckling: Yes Aggravating factors: resting on knees, "going too far sideways with the knee" Relieving factors: resting, pt reports she has to relax her muscles for her knee to reduce when it dislocates 24-hour pain behavior: varies depending on activity History of prior back, hip, or knee injury, pain, surgery, or therapy: Yes Falls: Has patient fallen in last 6 months? Yes, Number of  falls: 3-4 falls Dominant hand: right Imaging: No, pt denies any recent imaging of R knee  Prior level of function: Independent with basic ADLs, assist with IADLs Occupational demands: retired  Precautions: None  Weight Bearing Restrictions: No  Living Environment Lives with: lives alone Lives in: House/apartment, lives on one level, adjustable bed, WI shower with grab bars with seat, single point cane and rollator, ramp to enter  Patient Goals: Improve RLE stability   OBJECTIVE:   Patient Surveys  FOTO 43, predicted improvement 56  Cognition Patient is oriented to person, place, and time.  Recent memory is intact.  Remote memory is intact.  Attention span and concentration are intact.  Expressive speech is intact.  Patient's fund of knowledge is within normal limits for educational level.    Gross Musculoskeletal Assessment Tremor: None Bulk: Normal Tone: Normal  GAIT: Distance walked: 100' to enter/exit clinic Assistive device utilized: Single point cane Level of assistance: Modified independence Comments: Pt ambulates with single point cane in RUE.  Decreased self-selected gait speed and decreased step length bilaterally. Decreased stance time on RLE  Posture: Deferred full posture assessment. Pt with limited spinal mobility related to multiple spinal fusions.  AROM AROM (Normal range in degrees) AROM  11/04/2021  Lumbar   Flexion (65)   Extension (30)   Right lateral flexion (25)   Left lateral flexion (25)   Right rotation (30)   Left rotation (30)       Hip Right Left  Flexion (125) WNL WNL  Extension (15)    Abduction (40)    Adduction     Internal Rotation (45)    External Rotation (45)        Knee    Flexion (135) 129 125   Extension (0) 14 hyperextension 11 hyperextension      Ankle    Dorsiflexion (20)    Plantarflexion (50)    Inversion (35)    Eversion (15    (* = pain; Blank rows = not tested)  LE MMT: MMT (out of 5)  Right 11/14/2021 Left 11/14/2021  Hip flexion 5 5  Hip extension    Hip abduction    Hip adduction    Hip internal rotation    Hip external rotation    Knee flexion 5 5  Knee extension 5 5  Ankle dorsiflexion 5 5  Ankle plantarflexion    Ankle inversion    Ankle eversion    (* = pain; Blank rows = not tested)  Sensation Deferred on this date.  Reflexes Deferred  Muscle Length Hamstrings: R: Not examined L: Not examined Quadriceps Michela Pitcher): R: Not examined L: Not examined Hip flexors Maisie Fus): R: Not examined L: Not examined IT band Claiborne Rigg): R: Not examined L: Not examined  Palpation Location LEFT  RIGHT           Quadriceps 0   Medial Hamstrings 0   Lateral Hamstrings 0   Lateral Hamstring tendon 0   Medial Hamstring tendon 0   Quadriceps tendon 0   Patella 0   Patellar Tendon 0   Tibial Tuberosity 0   Medial joint line 1   Lateral joint line 1   MCL 1   LCL 1   Adductor Tubercle    Pes Anserine tendon    Infrapatellar fat pad    Fibular head 0   Popliteal fossa    (Blank rows = not tested) Graded on 0-4 scale (0 = no pain, 1 = pain, 2 = pain with wincing/grimacing/flinching, 3 = pain with withdrawal, 4 = unwilling to allow palpation), (Blank rows = not tested)  Passive Accessory Motion Deferred  VASCULAR: Deferred  SPECIAL TESTS  Ligamentous Stability  ACL: Lachman's: R: Positive L: Negative Active Lachman's: R: Not examined L: Not examined Anterior Drawer: R: Negative L: Negative Pivot Shift: R: Negative L: Negative  PCL: Posterior Drawer: R: Negative L: Negative Reverse Lachman's: R: Negative L: Negative Posterior Sag Sign: R: Negative L: Not examined  MCL: Valgus Stress (30 degrees flexion): R: Positive for laxity and minimal pain L: Not examined  LCL: Varus Stress (30 degrees flexion): R: Positive for laxity and minimal pain L: Not examined  Meniscus Tests McMurray's Test:  Medial Meniscus (Tibial ER): R: Not examined L: Not  examined Lateral Meniscus (Tibial IR): R: Not examined L: Not examined Thessaly: R: Not examined L: Not examined Steinmann Sign I: R: Not examined L: Not examined Steinmann Sign II: R: Not examined L: Not examined  Patellofemoral Pain Syndrome Patellar Tilt (Lateral): R: Negative L: Not examined Patellar Apprehension: R: Negative L: Not examined Squatting pain: R: Not examined L: Not examined Stair climbing pain: R: Not examined L: Not examined Kneeling pain: R: Not examined L: Not examined Resisted knee extension pain: R: Negative L: Negative Compression: R: Negative L: Not examined Clarke's sign: R: Not examined L: Not examined Lateral Pull: R: Not examined L: Not examined  Patellar Tendinopathy Inferior pole palpation with anterior tilt: R: Negative L: Not examined   Beighton Scale: Deferred  [place] Knee Rules for Acute Knee injury  Yes/No         1. Aged 55 years or over No   2. Tenderness at the head of the fibula No   3. Isolated tenderness of the patella  No   4. Inability to flex knee to 90 degrees No   5. Inability to bear weight (defined as an inability to take 4 steps, ie two steps on each leg, regardless of limping) immediately and at presentation No  TODAY'S TREATMENT   SUBJECTIVE: Pt reports that she is doing alright today. No significant changes since the last therapy session. 2/10 R knee pain upon arrival. She has struggled to be consistent with her HEP. No specific questions or concerns currently.  PAIN: 2/10 R knee pain   Ther-ex  NuStep L1-2 x 10 minutes BLE only for strengthening, no significant pain today, interval history obtained and therapist monitoring/adjusting resistance as appropriate; Supine RLE quad set 5s hold x 15; Supine RLE SLR with 1# ankle weight (AW) 2 x 10, fatigue noted at rep 10 during both sets; Supine RLE straight leg hip abduction 2 x 15 BLE, with manual resistance;  Supine RLE straight leg hip adduction 2 x 15 BLE, with manual  resistance; Supine RLE heel slides with manually resisted extension 2 x 15; Hooklying RLE SAQ over bolster 2 x 15 BLE, with manual resistance; Hooklying clams 2 x 15 BLE, with manual resistance; Hooklying adductor squeeze 2 x 15 BLE, with manual resistance; Hooklying bridges with arms at side 2 x 10; Seated heel raises with manual resistance by patient 2 x 15;   PATIENT EDUCATION:  Education details: Pt educated throughout session about proper posture and technique with exercises. Improved exercise technique, movement at target joints, use of target muscles after min to mod verbal, visual, tactile cues. Person educated: Patient Education method: Explanation, verbal cues, tactile cues Education comprehension: verbalized understanding and returned demonstration   HOME EXERCISE PROGRAM: Access Code: [email or web address] URL: [email or web address] Date: 11/11/2021 Prepared by: Ria Comment  Exercises - Supine Active Straight Leg Raise  - 1 x daily - 7 x weekly - 2 sets - 10 reps - 3s hold - Supine Quad Set (Mirrored)  - 1 x daily - 7 x weekly - 2 sets - 10 reps - 3s hold - Supine Bridge  - 1 x daily - 7 x weekly - 2 sets - 5 reps - 3s hold - Seated Hip Abduction with Resistance  - 1 x daily - 7 x weekly - 2 sets - 10 reps - 3s hold   ASSESSMENT:  CLINICAL IMPRESSION: Continued pain-free RLE strengthening during session today. Utilized mostly nonweightbearing positions through the R knee joint to minimize discomfort. She is able to perform SLR with added resistance today. During additional exercises care taken to provide resistance above the knee with lateral motions to avoid worsening medial to lateral instability. No HEP updates on this date as pt has struggled performed consistently at home. Plan is to continue progressive RLE strengthening at future visits. Patient will benefit from skilled PT to address above impairments and improve overall function.  REHAB POTENTIAL: Fair     CLINICAL DECISION MAKING: Unstable/unpredictable  EVALUATION COMPLEXITY: High   GOALS: Goals reviewed with patient? No  SHORT TERM GOALS: Target date: 12/02/2021  Pt will be independent with HEP to improve strength and decrease knee pain to improve pain-free function at home. Baseline:  Goal status: INITIAL   LONG TERM GOALS: Target date: 12/30/2021  Pt will increase FOTO to at least 53 to demonstrate significant improvement in function at home and work related to knee pain  Baseline: 11/04/21: 43 Goal status: INITIAL  2.  Pt will decrease worst knee pain by at least 3 points on the NPRS in order to demonstrate clinically significant reduction in knee pain. Baseline: 11/04/21: worst: 4/10 Goal status: INITIAL  3.  Pt will report no further R knee buckling or falls in order to demonstrate improvement in strength and function and  decrease risk for injury related to falls Baseline:  Goal status: INITIAL   PLAN: PT FREQUENCY: 1-2x/week  PT DURATION: 8 weeks  PLANNED INTERVENTIONS: Therapeutic exercises, Therapeutic activity, Neuromuscular re-education, Balance training, Gait training, Patient/Family education, Joint manipulation, Joint mobilization, Canalith repositioning, Aquatic Therapy, Dry Needling, Electrical stimulation, Spinal manipulation, Spinal mobilization, Cryotherapy, Moist heat, Taping, Traction, Ultrasound, Ionotophoresis 4mg /ml Dexamethasone, and Manual therapy  PLAN FOR NEXT SESSION: Progress strengthening for RLE, review and modify HEP as necessary  Lyndel Safe Nickcole Bralley PT, DPT, GCS  Sumaiya Arruda 11/14/2021, 10:01 AM

## 2021-11-25 ENCOUNTER — Ambulatory Visit: Payer: Medicare Other

## 2021-11-25 DIAGNOSIS — M25561 Pain in right knee: Secondary | ICD-10-CM | POA: Diagnosis not present

## 2021-11-25 DIAGNOSIS — M6281 Muscle weakness (generalized): Secondary | ICD-10-CM

## 2021-11-25 DIAGNOSIS — G8929 Other chronic pain: Secondary | ICD-10-CM

## 2021-11-25 NOTE — Therapy (Signed)
OUTPATIENT PHYSICAL THERAPY KNEE TREATMENT  Patient Name: Marilyn Keith MRN: [id number] DOB:1945/03/02, 76 y.o., female Today's Date: 11/25/2021   PT End of Session - 11/25/21 1012     Visit Number 4    Number of Visits 17    Date for PT Re-Evaluation 12/30/21    Authorization Type eval: 11/04/21;    PT Start Time 0855    PT Stop Time 0930    PT Time Calculation (min) 35 min    Equipment Utilized During Treatment Gait belt    Activity Tolerance Patient tolerated treatment well    Behavior During Therapy WFL for tasks assessed/performed             Past Medical History:  Diagnosis Date   Chronic back pain    GERD (gastroesophageal reflux disease)    Hypertension    Past Surgical History:  Procedure Laterality Date   BACK SURGERY     EYE SURGERY     MASTECTOMY     REPLACEMENT TOTAL KNEE BILATERAL     SHOULDER SURGERY Right    Patient Active Problem List   Diagnosis Date Noted   UTI (urinary tract infection) 08/03/2020   Chronic back pain    Hypertension    GERD (gastroesophageal reflux disease)    PCP: Jenell Milliner, MD  REFERRING PROVIDER: Paul Half, PA*  REFERRING DIAGNOSIS: Pain due to total right knee replacement, initial encounter (CMS-HCC) T84.84XA  Z96.651   THERAPY DIAG: Chronic pain of right knee  Muscle weakness (generalized)  RATIONALE FOR EVALUATION AND TREATMENT: Rehabilitation  ONSET DATE: 08/04/21 (acute on chronic, approximate)  FOLLOW UP APPT WITH PROVIDER: Yes, MD recommended follow-up after at least 4 weeks of PT.   FROM INITIAL EVALUATION SUBJECTIVE:                                                                                                                                                                                         Chief Complaint: R knee instability  Pertinent History Pt reports that she fell a few months ago and states "I think I tore the other ligament in my right knee." She fell down her driveway  when taking her recycling can down to the street. She underwent a right total knee arthroplasty about 10-12 years ago at an outside facility in [facility]. Patient relates that surgeon had a difficult time with her surgery as result of the severe valgus osteoarthritis in addition to poor bone quality due to treatments for breast cancer (hx of bilateral mastectomy). 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  She complains of significant instability in her right knee for at least the past 5 years but symptoms have worsened recently. She has been experiencing repeated dislocations in her R knee recently and has had 3-4 falls in the last 6 months. 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 She arrives wearing a hinged R knee brace and ambulating with a single point cane in RUE. She is also wearing a lace up R ankle brace due to recent severe foot pain for which she saw podiatry. She is using a rollator at all times at home. Pt has a history of chronic neck, low back, and bilateral shoulder pain with significant R shoulder weakness (hx of 3 RTC surgeries RUE).  Pain:  Pain Intensity: Present: 0/10, Best: 0/10, Worst: 4/10 Pain location: Medial and lateral joint line Pain quality:  "Intolerable"   Radiating pain: Yes down the RLE Swelling: No  Popping, catching, locking: Yes  Numbness/Tingling: Yes, bilateral feet and fingers Focal weakness or buckling: Yes Aggravating factors: resting on knees, "going too far sideways with the knee" Relieving factors: resting, pt reports she has to relax her muscles for her knee to reduce when it dislocates 24-hour pain behavior: varies depending on activity History of prior back, hip, or knee injury, pain, surgery, or therapy: Yes Falls: Has patient fallen in last 6 months? Yes, Number of  falls: 3-4 falls Dominant hand: right Imaging: No, pt denies any recent imaging of R knee  Prior level of function: Independent with basic ADLs, assist with IADLs Occupational demands: retired  Precautions: None  Weight Bearing Restrictions: No  Living Environment Lives with: lives alone Lives in: House/apartment, lives on one level, adjustable bed, WI shower with grab bars with seat, single point cane and rollator, ramp to enter  Patient Goals: Improve RLE stability   OBJECTIVE:   Patient Surveys  FOTO 43, predicted improvement 75  Cognition Patient is oriented to person, place, and time.  Recent memory is intact.  Remote memory is intact.  Attention span and concentration are intact.  Expressive speech is intact.  Patient's fund of knowledge is within normal limits for educational level.    Gross Musculoskeletal Assessment Tremor: None Bulk: Normal Tone: Normal  GAIT: Distance walked: 100' to enter/exit clinic Assistive device utilized: Single point cane Level of assistance: Modified independence Comments: Pt ambulates with single point cane in RUE.  Decreased self-selected gait speed and decreased step length bilaterally. Decreased stance time on RLE  Posture: Deferred full posture assessment. Pt with limited spinal mobility related to multiple spinal fusions.  AROM AROM (Normal range in degrees) AROM  11/04/2021  Lumbar   Flexion (65)   Extension (30)   Right lateral flexion (25)   Left lateral flexion (25)   Right rotation (30)   Left rotation (30)       Hip Right Left  Flexion (125) WNL WNL  Extension (15)    Abduction (40)    Adduction     Internal Rotation (45)    External Rotation (45)        Knee    Flexion (135) 129 125   Extension (0) 14 hyperextension 11 hyperextension      Ankle    Dorsiflexion (20)    Plantarflexion (50)    Inversion (35)    Eversion (15    (* = pain; Blank rows = not  tested)  LE MMT: MMT (out of 5)  Right 11/25/2021 Left 11/25/2021  Hip flexion 5 5  Hip extension    Hip abduction    Hip adduction    Hip internal rotation    Hip external rotation    Knee flexion 5 5  Knee extension 5 5  Ankle dorsiflexion 5 5  Ankle plantarflexion    Ankle inversion    Ankle eversion    (* = pain; Blank rows = not tested)  Sensation Deferred on this date.  Reflexes Deferred  Muscle Length Hamstrings: R: Not examined L: Not examined Quadriceps Michela Pitcher(Ely): R: Not examined L: Not examined Hip flexors Maisie Fus(Thomas): R: Not examined L: Not examined IT band Claiborne Rigg(Ober): R: Not examined L: Not examined  Palpation Location LEFT  RIGHT           Quadriceps 0   Medial Hamstrings 0   Lateral Hamstrings 0   Lateral Hamstring tendon 0   Medial Hamstring tendon 0   Quadriceps tendon 0   Patella 0   Patellar Tendon 0   Tibial Tuberosity 0   Medial joint line 1   Lateral joint line 1   MCL 1   LCL 1   Adductor Tubercle    Pes Anserine tendon    Infrapatellar fat pad    Fibular head 0   Popliteal fossa    (Blank rows = not tested) Graded on 0-4 scale (0 = no pain, 1 = pain, 2 = pain with wincing/grimacing/flinching, 3 = pain with withdrawal, 4 = unwilling to allow palpation), (Blank rows = not tested)  Passive Accessory Motion Deferred  VASCULAR: Deferred  SPECIAL TESTS  Ligamentous Stability  ACL: Lachman's: R: Positive L: Negative Active Lachman's: R: Not examined L: Not examined Anterior Drawer: R: Negative L: Negative Pivot Shift: R: Negative L: Negative  PCL: Posterior Drawer: R: Negative L: Negative Reverse Lachman's: R: Negative L: Negative Posterior Sag Sign: R: Negative L: Not examined  MCL: Valgus Stress (30 degrees flexion): R: Positive for laxity and minimal pain L: Not examined  LCL: Varus Stress (30 degrees flexion): R: Positive for laxity and minimal pain L: Not examined  Meniscus Tests McMurray's Test:  Medial Meniscus (Tibial ER): R: Not examined L: Not  examined Lateral Meniscus (Tibial IR): R: Not examined L: Not examined Thessaly: R: Not examined L: Not examined Steinmann Sign I: R: Not examined L: Not examined Steinmann Sign II: R: Not examined L: Not examined  Patellofemoral Pain Syndrome Patellar Tilt (Lateral): R: Negative L: Not examined Patellar Apprehension: R: Negative L: Not examined Squatting pain: R: Not examined L: Not examined Stair climbing pain: R: Not examined L: Not examined Kneeling pain: R: Not examined L: Not examined Resisted knee extension pain: R: Negative L: Negative Compression: R: Negative L: Not examined Clarke's sign: R: Not examined L: Not examined Lateral Pull: R: Not examined L: Not examined  Patellar Tendinopathy Inferior pole palpation with anterior tilt: R: Negative L: Not examined   Beighton Scale: Deferred  [place] Knee Rules for Acute Knee injury  Yes/No         1. Aged 55 years or over No   2. Tenderness at the head of the fibula No   3. Isolated tenderness of the patella  No   4. Inability to flex knee to 90 degrees No   5. Inability to bear weight (defined as an inability to take 4 steps, ie two steps on each leg, regardless of limping) immediately and at  presentation No     TODAY'S TREATMENT   SUBJECTIVE: Pt reports that she has not been feeling well recently. Her coughing has been getting worse and is causing significant fatigue. She complains of 3/10 medial R knee pain upon arrival. She performed her resistance band exercises last night. No specific questions currently.   PAIN: 3/10 medial R knee pain   Ther-ex  Vitals: BP: 175/104 mmHg, HR: 100, SpO2: 98% Repeat vitals: BP: 169/89 mmHg, HR: 101, SpO2: 98% NuStep L1 x 10 minutes BLE only for strengthening, no significant pain today, interval history obtained and therapist monitoring response to challenge (5 minutes unbilled); Supine RLE quad set 5s hold x 10; Supine RLE SLR with 1# ankle weight (AW) x 10, fatigue noted at rep 10  and pt struggling with coughing while in recumbent position; Seated RLE LAQ with 1# AW 2 x 10; Seated RLE hamstring curls with green tband 2 x 10; Seated clams with green tband resistance 2 x 10; Seated adductor ball squeezes 2 x 10; Seated heel raises with manual resistance by therapist 2 x 10;   Not performed: Supine RLE straight leg hip abduction 2 x 15 BLE, with manual resistance;  Supine RLE straight leg hip adduction 2 x 15 BLE, with manual resistance; Supine RLE heel slides with manually resisted extension 2 x 15; Hooklying bridges with arms at side 2 x 10;   PATIENT EDUCATION:  Education details: Pt educated throughout session about proper posture and technique with exercises. Improved exercise technique, movement at target joints, use of target muscles after min to mod verbal, visual, tactile cues. Person educated: Patient Education method: Explanation, verbal cues, tactile cues Education comprehension: verbalized understanding and returned demonstration   HOME EXERCISE PROGRAM: Access Code: [email or web address] URL: [email or web address] Date: 11/11/2021 Prepared by: Ria Comment  Exercises - Supine Active Straight Leg Raise  - 1 x daily - 7 x weekly - 2 sets - 10 reps - 3s hold - Supine Quad Set (Mirrored)  - 1 x daily - 7 x weekly - 2 sets - 10 reps - 3s hold - Supine Bridge  - 1 x daily - 7 x weekly - 2 sets - 5 reps - 3s hold - Seated Hip Abduction with Resistance  - 1 x daily - 7 x weekly - 2 sets - 10 reps - 3s hold   ASSESSMENT:  CLINICAL IMPRESSION: Continued pain-free RLE strengthening during session today. She is struggling more with her coughing over the last week. She has difficulty while in semirecumbent position so after the first few exercises transitioned to sitting She is fatigued easily and rest breaks provided between exercises/sets. No HEP updates on this date. Pt encouraged to follow up with PCP if breathing continues to be challenging. Plan is  to continue progressive RLE strengthening at future visits as pt is able to tolerate. Patient will benefit from skilled PT to address above impairments and improve overall function.  REHAB POTENTIAL: Fair    CLINICAL DECISION MAKING: Unstable/unpredictable  EVALUATION COMPLEXITY: High   GOALS: Goals reviewed with patient? No  SHORT TERM GOALS: Target date: 12/02/2021  Pt will be independent with HEP to improve strength and decrease knee pain to improve pain-free function at home. Baseline:  Goal status: INITIAL   LONG TERM GOALS: Target date: 12/30/2021  Pt will increase FOTO to at least 53 to demonstrate significant improvement in function at home and work related to knee pain  Baseline: 11/04/21: 43 Goal status: INITIAL  2.  Pt will  decrease worst knee pain by at least 3 points on the NPRS in order to demonstrate clinically significant reduction in knee pain. Baseline: 11/04/21: worst: 4/10 Goal status: INITIAL  3.  Pt will report no further R knee buckling or falls in order to demonstrate improvement in strength and function and decrease risk for injury related to falls Baseline:  Goal status: INITIAL   PLAN: PT FREQUENCY: 1-2x/week  PT DURATION: 8 weeks  PLANNED INTERVENTIONS: Therapeutic exercises, Therapeutic activity, Neuromuscular re-education, Balance training, Gait training, Patient/Family education, Joint manipulation, Joint mobilization, Canalith repositioning, Aquatic Therapy, Dry Needling, Electrical stimulation, Spinal manipulation, Spinal mobilization, Cryotherapy, Moist heat, Taping, Traction, Ultrasound, Ionotophoresis 4mg /ml Dexamethasone, and Manual therapy  PLAN FOR NEXT SESSION: Progress strengthening for RLE, review and modify HEP as necessary  Zivah Mayr PT, DPT, GCS  Simra Fiebig 11/25/2021, 10:16 AM

## 2021-11-27 ENCOUNTER — Ambulatory Visit: Payer: Medicare Other

## 2021-12-01 NOTE — Therapy (Unsigned)
OUTPATIENT PHYSICAL THERAPY KNEE TREATMENT  Patient Name: Marilyn Keith MRN: [id number] DOB:[birth year], 76 y.o., female Today's Date: 12/02/2021   PT End of Session - 12/02/21 0850     Visit Number 5    Number of Visits 17    Date for PT Re-Evaluation 12/30/21    Authorization Type eval: 11/04/21;    PT Start Time 0845    PT Stop Time 0930    PT Time Calculation (min) 45 min    Equipment Utilized During Treatment Gait belt    Activity Tolerance Patient tolerated treatment well    Behavior During Therapy WFL for tasks assessed/performed            Past Medical History:  Diagnosis Date   Chronic back pain    GERD (gastroesophageal reflux disease)    Hypertension    Past Surgical History:  Procedure Laterality Date   BACK SURGERY     EYE SURGERY     MASTECTOMY     REPLACEMENT TOTAL KNEE BILATERAL     SHOULDER SURGERY Right    Patient Active Problem List   Diagnosis Date Noted   UTI (urinary tract infection) 08/03/2020   Chronic back pain    Hypertension    GERD (gastroesophageal reflux disease)    PCP: Jenell Milliner, MD  REFERRING PROVIDER: Paul Half, PA*  REFERRING DIAGNOSIS: Pain due to total right knee replacement, initial encounter (CMS-HCC) T84.84XA  Z96.651   THERAPY DIAG: Chronic pain of right knee  Muscle weakness (generalized)  RATIONALE FOR EVALUATION AND TREATMENT: Rehabilitation  ONSET DATE: 08/04/21 (acute on chronic, approximate)  FOLLOW UP APPT WITH PROVIDER: Yes, MD recommended follow-up after at least 4 weeks of PT.   FROM INITIAL EVALUATION SUBJECTIVE:                                                                                                                                                                                         Chief Complaint: R knee instability  Pertinent History Pt reports that she fell a few months ago and states "I think I tore the other ligament in my right knee." She fell down her driveway  when taking her recycling can down to the street. She underwent a right total knee arthroplasty about 10-12 years ago at an outside facility in [facility]. Patient relates that surgeon had a difficult time with her surgery as result of the severe valgus osteoarthritis in addition to poor bone quality due to treatments for breast cancer (hx of bilateral mastectomy). 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  She complains of significant instability in her right knee for at least the past 5 years but symptoms have worsened recently. She has been experiencing repeated dislocations in her R knee recently and has had 3-4 falls in the last 6 months. 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 She arrives wearing a hinged R knee brace and ambulating with a single point cane in RUE. She is also wearing a lace up R ankle brace due to recent severe foot pain for which she saw podiatry. She is using a rollator at all times at home. Pt has a history of chronic neck, low back, and bilateral shoulder pain with significant R shoulder weakness (hx of 3 RTC surgeries RUE).  Pain:  Pain Intensity: Present: 0/10, Best: 0/10, Worst: 4/10 Pain location: Medial and lateral joint line Pain quality:  "Intolerable"   Radiating pain: Yes down the RLE Swelling: No  Popping, catching, locking: Yes  Numbness/Tingling: Yes, bilateral feet and fingers Focal weakness or buckling: Yes Aggravating factors: resting on knees, "going too far sideways with the knee" Relieving factors: resting, pt reports she has to relax her muscles for her knee to reduce when it dislocates 24-hour pain behavior: varies depending on activity History of prior back, hip, or knee injury, pain, surgery, or therapy: Yes Falls: Has patient fallen in last 6 months? Yes, Number of  falls: 3-4 falls Dominant hand: right Imaging: No, pt denies any recent imaging of R knee  Prior level of function: Independent with basic ADLs, assist with IADLs Occupational demands: retired  Precautions: None  Weight Bearing Restrictions: No  Living Environment Lives with: lives alone Lives in: House/apartment, lives on one level, adjustable bed, WI shower with grab bars with seat, single point cane and rollator, ramp to enter  Patient Goals: Improve RLE stability   OBJECTIVE:   Patient Surveys  FOTO 43, predicted improvement 56  Cognition Patient is oriented to person, place, and time.  Recent memory is intact.  Remote memory is intact.  Attention span and concentration are intact.  Expressive speech is intact.  Patient's fund of knowledge is within normal limits for educational level.    Gross Musculoskeletal Assessment Tremor: None Bulk: Normal Tone: Normal  GAIT: Distance walked: 100' to enter/exit clinic Assistive device utilized: Single point cane Level of assistance: Modified independence Comments: Pt ambulates with single point cane in RUE.  Decreased self-selected gait speed and decreased step length bilaterally. Decreased stance time on RLE  Posture: Deferred full posture assessment. Pt with limited spinal mobility related to multiple spinal fusions.  AROM AROM (Normal range in degrees) AROM  11/04/2021  Lumbar   Flexion (65)   Extension (30)   Right lateral flexion (25)   Left lateral flexion (25)   Right rotation (30)   Left rotation (30)       Hip Right Left  Flexion (125) WNL WNL  Extension (15)    Abduction (40)    Adduction     Internal Rotation (45)    External Rotation (45)        Knee    Flexion (135) 129 125   Extension (0) 14 hyperextension 11 hyperextension      Ankle    Dorsiflexion (20)    Plantarflexion (50)    Inversion (35)    Eversion (15    (* = pain; Blank rows = not tested)  LE MMT: MMT (out of 5)  Right 12/02/2021 Left 12/02/2021  Hip flexion 5 5  Hip extension    Hip abduction    Hip adduction    Hip internal rotation    Hip external rotation    Knee flexion 5 5  Knee extension 5 5  Ankle dorsiflexion 5 5  Ankle plantarflexion    Ankle inversion    Ankle eversion    (* = pain; Blank rows = not tested)  Sensation Deferred on this date.  Reflexes Deferred  Muscle Length Hamstrings: R: Not examined L: Not examined Quadriceps Michela Pitcher(Ely): R: Not examined L: Not examined Hip flexors Maisie Fus(Thomas): R: Not examined L: Not examined IT band Claiborne Rigg(Ober): R: Not examined L: Not examined  Palpation Location LEFT  RIGHT           Quadriceps 0   Medial Hamstrings 0   Lateral Hamstrings 0   Lateral Hamstring tendon 0   Medial Hamstring tendon 0   Quadriceps tendon 0   Patella 0   Patellar Tendon 0   Tibial Tuberosity 0   Medial joint line 1   Lateral joint line 1   MCL 1   LCL 1   Adductor Tubercle    Pes Anserine tendon    Infrapatellar fat pad    Fibular head 0   Popliteal fossa    (Blank rows = not tested) Graded on 0-4 scale (0 = no pain, 1 = pain, 2 = pain with wincing/grimacing/flinching, 3 = pain with withdrawal, 4 = unwilling to allow palpation), (Blank rows = not tested)  Passive Accessory Motion Deferred  VASCULAR: Deferred  SPECIAL TESTS  Ligamentous Stability  ACL: Lachman's: R: Positive L: Negative Active Lachman's: R: Not examined L: Not examined Anterior Drawer: R: Negative L: Negative Pivot Shift: R: Negative L: Negative  PCL: Posterior Drawer: R: Negative L: Negative Reverse Lachman's: R: Negative L: Negative Posterior Sag Sign: R: Negative L: Not examined  MCL: Valgus Stress (30 degrees flexion): R: Positive for laxity and minimal pain L: Not examined  LCL: Varus Stress (30 degrees flexion): R: Positive for laxity and minimal pain L: Not examined  Meniscus Tests McMurray's Test:  Medial Meniscus (Tibial ER): R: Not examined L: Not  examined Lateral Meniscus (Tibial IR): R: Not examined L: Not examined Thessaly: R: Not examined L: Not examined Steinmann Sign I: R: Not examined L: Not examined Steinmann Sign II: R: Not examined L: Not examined  Patellofemoral Pain Syndrome Patellar Tilt (Lateral): R: Negative L: Not examined Patellar Apprehension: R: Negative L: Not examined Squatting pain: R: Not examined L: Not examined Stair climbing pain: R: Not examined L: Not examined Kneeling pain: R: Not examined L: Not examined Resisted knee extension pain: R: Negative L: Negative Compression: R: Negative L: Not examined Clarke's sign: R: Not examined L: Not examined Lateral Pull: R: Not examined L: Not examined  Patellar Tendinopathy Inferior pole palpation with anterior tilt: R: Negative L: Not examined   Beighton Scale: Deferred  [place] Knee Rules for Acute Knee injury  Yes/No         1. Aged 55 years or over No   2. Tenderness at the head of the fibula No   3. Isolated tenderness of the patella  No   4. Inability to flex knee to 90 degrees No   5. Inability to bear weight (defined as an inability to take 4 steps, ie two steps on each leg, regardless of limping) immediately and at presentation No  TODAY'S TREATMENT   SUBJECTIVE: Pt reports that her coughing has been slightly better recently but it has not resolved. She denies any resting R knee pain upon arrival today. She has been able to perform her HEP, particularly the ones where she is lying down. No specific questions currently.   PAIN: Denies resting R knee pain   Ther-ex  NuStep L1-3 x 10 minutes BLE only for strengthening, no significant pain today, interval history obtained and therapist monitoring response to challenge and adjusting the resistance (4 minutes unbilled); Seated clams with green tband resistance 2 x 15; Seated adductor ball squeezes 2 x 15; Seated marches with 2# ankle weights (AW) 2 x 15 BLE; Seated LAQ with 2# AW 2 x 15  BLE; Seated hamstring curls with green tband 2 x 15 BLE; Seated heel raises with manual resistance by therapist 2 x 15 BLE;   Not performed: Supine RLE straight leg hip abduction 2 x 15 BLE, with manual resistance;  Supine RLE straight leg hip adduction 2 x 15 BLE, with manual resistance; Supine RLE heel slides with manually resisted extension 2 x 15; Hooklying bridges with arms at side 2 x 10; Supine RLE quad set 5s hold x 10; Supine RLE SLR with 1# ankle weight (AW) x 10   PATIENT EDUCATION:  Education details: Pt educated throughout session about proper posture and technique with exercises. Improved exercise technique, movement at target joints, use of target muscles after min to mod verbal, visual, tactile cues. Person educated: Patient Education method: Explanation, verbal cues, tactile cues Education comprehension: verbalized understanding and returned demonstration   HOME EXERCISE PROGRAM: Access Code: [email or web address] URL: [email or web address] Date: 11/11/2021 Prepared by: Ria Comment  Exercises - Supine Active Straight Leg Raise  - 1 x daily - 7 x weekly - 2 sets - 10 reps - 3s hold - Supine Quad Set (Mirrored)  - 1 x daily - 7 x weekly - 2 sets - 10 reps - 3s hold - Supine Bridge  - 1 x daily - 7 x weekly - 2 sets - 5 reps - 3s hold - Seated Hip Abduction with Resistance  - 1 x daily - 7 x weekly - 2 sets - 10 reps - 3s hold   ASSESSMENT:  CLINICAL IMPRESSION: Continued pain-free RLE strengthening during session today. Her coughing is slightly improved during session today. Due to cough performed entire session in seated position. She is fatigued easily and rest breaks provided between exercises/sets but improved from last therapy session. No HEP updates on this date. Plan is to continue progressive RLE strengthening at future visits as pt is able to tolerate. Patient will benefit from skilled PT to address above impairments and improve overall  function.  REHAB POTENTIAL: Fair    CLINICAL DECISION MAKING: Unstable/unpredictable  EVALUATION COMPLEXITY: High   GOALS: Goals reviewed with patient? No  SHORT TERM GOALS: Target date: 12/02/2021  Pt will be independent with HEP to improve strength and decrease knee pain to improve pain-free function at home. Baseline:  Goal status: INITIAL   LONG TERM GOALS: Target date: 12/30/2021  Pt will increase FOTO to at least 53 to demonstrate significant improvement in function at home and work related to knee pain  Baseline: 11/04/21: 43 Goal status: INITIAL  2.  Pt will decrease worst knee pain by at least 3 points on the NPRS in order to demonstrate clinically significant reduction in knee pain. Baseline: 11/04/21: worst: 4/10 Goal status: INITIAL  3.  Pt will report no  further R knee buckling or falls in order to demonstrate improvement in strength and function and decrease risk for injury related to falls Baseline:  Goal status: INITIAL   PLAN: PT FREQUENCY: 1-2x/week  PT DURATION: 8 weeks  PLANNED INTERVENTIONS: Therapeutic exercises, Therapeutic activity, Neuromuscular re-education, Balance training, Gait training, Patient/Family education, Joint manipulation, Joint mobilization, Canalith repositioning, Aquatic Therapy, Dry Needling, Electrical stimulation, Spinal manipulation, Spinal mobilization, Cryotherapy, Moist heat, Taping, Traction, Ultrasound, Ionotophoresis 4mg /ml Dexamethasone, and Manual therapy  PLAN FOR NEXT SESSION: Progress strengthening for RLE, review and modify HEP as necessary  Christinamarie Tall PT, DPT, GCS  Zaynab Chipman 12/02/2021, 12:46 PM

## 2021-12-02 ENCOUNTER — Ambulatory Visit: Payer: Medicare Other

## 2021-12-02 DIAGNOSIS — G8929 Other chronic pain: Secondary | ICD-10-CM

## 2021-12-02 DIAGNOSIS — M25561 Pain in right knee: Secondary | ICD-10-CM | POA: Diagnosis not present

## 2021-12-02 DIAGNOSIS — M6281 Muscle weakness (generalized): Secondary | ICD-10-CM

## 2021-12-03 NOTE — Therapy (Signed)
OUTPATIENT PHYSICAL THERAPY KNEE TREATMENT  Patient Name: Marilyn Keith MRN: [id number] DOB:1945/02/20, 76 y.o., female Today's Date: 12/04/2021   PT End of Session - 12/04/21 0900     Visit Number 6    Number of Visits 17    Date for PT Re-Evaluation 12/30/21    Authorization Type eval: 11/04/21;    PT Start Time [phone]    PT Stop Time 0930    PT Time Calculation (min) 38 min    Equipment Utilized During Treatment Gait belt    Activity Tolerance Patient tolerated treatment well    Behavior During Therapy WFL for tasks assessed/performed            Past Medical History:  Diagnosis Date   Chronic back pain    GERD (gastroesophageal reflux disease)    Hypertension    Past Surgical History:  Procedure Laterality Date   BACK SURGERY     EYE SURGERY     MASTECTOMY     REPLACEMENT TOTAL KNEE BILATERAL     SHOULDER SURGERY Right    Patient Active Problem List   Diagnosis Date Noted   UTI (urinary tract infection) 08/03/2020   Chronic back pain    Hypertension    GERD (gastroesophageal reflux disease)    PCP: Jenell Milliner, MD  REFERRING PROVIDER: Paul Half, PA*  REFERRING DIAGNOSIS: Pain due to total right knee replacement, initial encounter (CMS-HCC) T84.84XA  Z96.651   THERAPY DIAG: Chronic pain of right knee  Muscle weakness (generalized)  RATIONALE FOR EVALUATION AND TREATMENT: Rehabilitation  ONSET DATE: 08/04/21 (acute on chronic, approximate)  FOLLOW UP APPT WITH PROVIDER: Yes, MD recommended follow-up after at least 4 weeks of PT.   FROM INITIAL EVALUATION SUBJECTIVE:                                                                                                                                                                                         Chief Complaint: R knee instability  Pertinent History Pt reports that she fell a few months ago and states "I think I tore the other ligament in my right knee." She fell down her driveway  when taking her recycling can down to the street. She underwent a right total knee arthroplasty about 10-12 years ago at an outside facility in [facility]. Patient relates that surgeon had a difficult time with her surgery as result of the severe valgus osteoarthritis in addition to poor bone quality due to treatments for breast cancer (hx of bilateral mastectomy). 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  She complains of significant instability in her right knee for at least the past 5 years but symptoms have worsened recently. She has been experiencing repeated dislocations in her R knee recently and has had 3-4 falls in the last 6 months. 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 She arrives wearing a hinged R knee brace and ambulating with a single point cane in RUE. She is also wearing a lace up R ankle brace due to recent severe foot pain for which she saw podiatry. She is using a rollator at all times at home. Pt has a history of chronic neck, low back, and bilateral shoulder pain with significant R shoulder weakness (hx of 3 RTC surgeries RUE).  Pain:  Pain Intensity: Present: 0/10, Best: 0/10, Worst: 4/10 Pain location: Medial and lateral joint line Pain quality:  "Intolerable"   Radiating pain: Yes down the RLE Swelling: No  Popping, catching, locking: Yes  Numbness/Tingling: Yes, bilateral feet and fingers Focal weakness or buckling: Yes Aggravating factors: resting on knees, "going too far sideways with the knee" Relieving factors: resting, pt reports she has to relax her muscles for her knee to reduce when it dislocates 24-hour pain behavior: varies depending on activity History of prior back, hip, or knee injury, pain, surgery, or therapy: Yes Falls: Has patient fallen in last 6 months? Yes, Number of  falls: 3-4 falls Dominant hand: right Imaging: No, pt denies any recent imaging of R knee  Prior level of function: Independent with basic ADLs, assist with IADLs Occupational demands: retired  Precautions: None  Weight Bearing Restrictions: No  Living Environment Lives with: lives alone Lives in: House/apartment, lives on one level, adjustable bed, WI shower with grab bars with seat, single point cane and rollator, ramp to enter  Patient Goals: Improve RLE stability   OBJECTIVE:   Patient Surveys  FOTO 43, predicted improvement 56  Cognition Patient is oriented to person, place, and time.  Recent memory is intact.  Remote memory is intact.  Attention span and concentration are intact.  Expressive speech is intact.  Patient's fund of knowledge is within normal limits for educational level.    Gross Musculoskeletal Assessment Tremor: None Bulk: Normal Tone: Normal  GAIT: Distance walked: 100' to enter/exit clinic Assistive device utilized: Single point cane Level of assistance: Modified independence Comments: Pt ambulates with single point cane in RUE.  Decreased self-selected gait speed and decreased step length bilaterally. Decreased stance time on RLE  Posture: Deferred full posture assessment. Pt with limited spinal mobility related to multiple spinal fusions.  AROM AROM (Normal range in degrees) AROM  11/04/2021  Lumbar   Flexion (65)   Extension (30)   Right lateral flexion (25)   Left lateral flexion (25)   Right rotation (30)   Left rotation (30)       Hip Right Left  Flexion (125) WNL WNL  Extension (15)    Abduction (40)    Adduction     Internal Rotation (45)    External Rotation (45)        Knee    Flexion (135) 129 125   Extension (0) 14 hyperextension 11 hyperextension      Ankle    Dorsiflexion (20)    Plantarflexion (50)    Inversion (35)    Eversion (15    (* = pain; Blank rows = not tested)  LE MMT: MMT (out of 5)  Right 12/04/2021 Left 12/04/2021  Hip flexion 5 5  Hip extension    Hip abduction    Hip adduction    Hip internal rotation    Hip external rotation    Knee flexion 5 5  Knee extension 5 5  Ankle dorsiflexion 5 5  Ankle plantarflexion    Ankle inversion    Ankle eversion    (* = pain; Blank rows = not tested)  Sensation Deferred on this date.  Reflexes Deferred  Muscle Length Hamstrings: R: Not examined L: Not examined Quadriceps Pat Patrick): R: Not examined L: Not examined Hip flexors Marcello Moores): R: Not examined L: Not examined IT band Nicoletta Dress): R: Not examined L: Not examined  Palpation Location LEFT  RIGHT           Quadriceps 0   Medial Hamstrings 0   Lateral Hamstrings 0   Lateral Hamstring tendon 0   Medial Hamstring tendon 0   Quadriceps tendon 0   Patella 0   Patellar Tendon 0   Tibial Tuberosity 0   Medial joint line 1   Lateral joint line 1   MCL 1   LCL 1   Adductor Tubercle    Pes Anserine tendon    Infrapatellar fat pad    Fibular head 0   Popliteal fossa    (Blank rows = not tested) Graded on 0-4 scale (0 = no pain, 1 = pain, 2 = pain with wincing/grimacing/flinching, 3 = pain with withdrawal, 4 = unwilling to allow palpation), (Blank rows = not tested)  Passive Accessory Motion Deferred  VASCULAR: Deferred  SPECIAL TESTS  Ligamentous Stability  ACL: Lachman's: R: Positive L: Negative Active Lachman's: R: Not examined L: Not examined Anterior Drawer: R: Negative L: Negative Pivot Shift: R: Negative L: Negative  PCL: Posterior Drawer: R: Negative L: Negative Reverse Lachman's: R: Negative L: Negative Posterior Sag Sign: R: Negative L: Not examined  MCL: Valgus Stress (30 degrees flexion): R: Positive for laxity and minimal pain L: Not examined  LCL: Varus Stress (30 degrees flexion): R: Positive for laxity and minimal pain L: Not examined  Meniscus Tests McMurray's Test:  Medial Meniscus (Tibial ER): R: Not examined L: Not  examined Lateral Meniscus (Tibial IR): R: Not examined L: Not examined Thessaly: R: Not examined L: Not examined Steinmann Sign I: R: Not examined L: Not examined Steinmann Sign II: R: Not examined L: Not examined  Patellofemoral Pain Syndrome Patellar Tilt (Lateral): R: Negative L: Not examined Patellar Apprehension: R: Negative L: Not examined Squatting pain: R: Not examined L: Not examined Stair climbing pain: R: Not examined L: Not examined Kneeling pain: R: Not examined L: Not examined Resisted knee extension pain: R: Negative L: Negative Compression: R: Negative L: Not examined Clarke's sign: R: Not examined L: Not examined Lateral Pull: R: Not examined L: Not examined  Patellar Tendinopathy Inferior pole palpation with anterior tilt: R: Negative L: Not examined   Beighton Scale: Deferred  [place] Knee Rules for Acute Knee injury  Yes/No         1. Aged 55 years or over No   2. Tenderness at the head of the fibula No   3. Isolated tenderness of the patella  No   4. Inability to flex knee to 90 degrees No   5. Inability to bear weight (defined as an inability to take 4 steps, ie two steps on each leg, regardless of limping) immediately and at presentation No  TODAY'S TREATMENT   SUBJECTIVE: Pt reports that her coughing has been slowly improving but it has not resolved. She reports 3/10 resting R knee pain upon arrival today. No specific questions currently.   PAIN: 3/10 R knee pain   Ther-ex  NuStep L1-3 x 10 minutes BLE only for strengthening, no significant pain today, interval history obtained and therapist monitoring response to challenge and adjusting the resistance; Seated clams with green tband resistance 2 x 15; Seated adductor ball squeezes 2 x 15; Seated marches with 3# ankle weights (AW) 2 x 15 BLE; Seated LAQ with 3# AW 2 x 15 BLE; Seated R hamstring curls with green tband 2 x 15; Seated heel raises with manual resistance by patient 2 x 20  BLE;   Not performed: Supine RLE straight leg hip abduction 2 x 15 BLE, with manual resistance;  Supine RLE straight leg hip adduction 2 x 15 BLE, with manual resistance; Supine RLE heel slides with manually resisted extension 2 x 15; Hooklying bridges with arms at side 2 x 10; Supine RLE quad set 5s hold x 10; Supine RLE SLR with 1# ankle weight (AW) x 10   PATIENT EDUCATION:  Education details: Pt educated throughout session about proper posture and technique with exercises. Improved exercise technique, movement at target joints, use of target muscles after min to mod verbal, visual, tactile cues. Person educated: Patient Education method: Explanation, verbal cues, tactile cues Education comprehension: verbalized understanding and returned demonstration   HOME EXERCISE PROGRAM: Access Code: [email or web address] URL: [email or web address] Date: 11/11/2021 Prepared by: Roxana Hires  Exercises - Supine Active Straight Leg Raise  - 1 x daily - 7 x weekly - 2 sets - 10 reps - 3s hold - Supine Quad Set (Mirrored)  - 1 x daily - 7 x weekly - 2 sets - 10 reps - 3s hold - Supine Bridge  - 1 x daily - 7 x weekly - 2 sets - 5 reps - 3s hold - Seated Hip Abduction with Resistance  - 1 x daily - 7 x weekly - 2 sets - 10 reps - 3s hold   ASSESSMENT:  CLINICAL IMPRESSION: Continued pain-free RLE strengthening during session today. Her coughing is slightly improved during session today. Due to cough repeated entire session in seated position again today. She is fatigued easily and rest breaks provided between exercises/sets but improved from last therapy session. No HEP updates on this date. Plan is to continue progressive RLE strengthening at future visits as pt is able to tolerate. Patient will benefit from skilled PT to address above impairments and improve overall function.  REHAB POTENTIAL: Fair    CLINICAL DECISION MAKING: Unstable/unpredictable  EVALUATION COMPLEXITY:  High   GOALS: Goals reviewed with patient? No  SHORT TERM GOALS: Target date: 12/02/2021  Pt will be independent with HEP to improve strength and decrease knee pain to improve pain-free function at home. Baseline:  Goal status: INITIAL   LONG TERM GOALS: Target date: 12/30/2021  Pt will increase FOTO to at least 53 to demonstrate significant improvement in function at home and work related to knee pain  Baseline: 11/04/21: 43 Goal status: INITIAL  2.  Pt will decrease worst knee pain by at least 3 points on the NPRS in order to demonstrate clinically significant reduction in knee pain. Baseline: 11/04/21: worst: 4/10 Goal status: INITIAL  3.  Pt will report no further R knee buckling or falls in order to demonstrate improvement in strength and function and decrease risk for  injury related to falls Baseline:  Goal status: INITIAL   PLAN: PT FREQUENCY: 1-2x/week  PT DURATION: 8 weeks  PLANNED INTERVENTIONS: Therapeutic exercises, Therapeutic activity, Neuromuscular re-education, Balance training, Gait training, Patient/Family education, Joint manipulation, Joint mobilization, Canalith repositioning, Aquatic Therapy, Dry Needling, Electrical stimulation, Spinal manipulation, Spinal mobilization, Cryotherapy, Moist heat, Taping, Traction, Ultrasound, Ionotophoresis 4mg /ml Dexamethasone, and Manual therapy  PLAN FOR NEXT SESSION: Progress strengthening for RLE, review and modify HEP as necessary  Lyndel Safe Lejuan Botto PT, DPT, GCS  Liani Caris 12/04/2021, 1:33 PM

## 2021-12-04 ENCOUNTER — Ambulatory Visit: Payer: Medicare Other

## 2021-12-04 DIAGNOSIS — G8929 Other chronic pain: Secondary | ICD-10-CM

## 2021-12-04 DIAGNOSIS — M25561 Pain in right knee: Secondary | ICD-10-CM | POA: Diagnosis not present

## 2021-12-04 DIAGNOSIS — M6281 Muscle weakness (generalized): Secondary | ICD-10-CM

## 2021-12-09 ENCOUNTER — Ambulatory Visit: Payer: Medicare Other

## 2021-12-09 DIAGNOSIS — M25561 Pain in right knee: Secondary | ICD-10-CM | POA: Diagnosis not present

## 2021-12-09 DIAGNOSIS — M6281 Muscle weakness (generalized): Secondary | ICD-10-CM

## 2021-12-09 DIAGNOSIS — G8929 Other chronic pain: Secondary | ICD-10-CM

## 2021-12-09 NOTE — Therapy (Signed)
OUTPATIENT PHYSICAL THERAPY KNEE TREATMENT  Patient Name: Marilyn Keith MRN: [id number] DOB:Jul 28, 1945, 76 y.o., female Today's Date: 12/09/2021   PT End of Session - 12/09/21 1320     Visit Number 7    Number of Visits 17    Date for PT Re-Evaluation 12/30/21    Authorization Type eval: 11/04/21;    PT Start Time 0855    PT Stop Time 0930    PT Time Calculation (min) 35 min    Equipment Utilized During Treatment Gait belt    Activity Tolerance Patient tolerated treatment well    Behavior During Therapy WFL for tasks assessed/performed            Past Medical History:  Diagnosis Date   Chronic back pain    GERD (gastroesophageal reflux disease)    Hypertension    Past Surgical History:  Procedure Laterality Date   BACK SURGERY     EYE SURGERY     MASTECTOMY     REPLACEMENT TOTAL KNEE BILATERAL     SHOULDER SURGERY Right    Patient Active Problem List   Diagnosis Date Noted   UTI (urinary tract infection) 08/03/2020   Chronic back pain    Hypertension    GERD (gastroesophageal reflux disease)    PCP: Jenell Milliner, MD  REFERRING PROVIDER: Paul Half, PA*  REFERRING DIAGNOSIS: Pain due to total right knee replacement, initial encounter (CMS-HCC) T84.84XA  Z96.651   THERAPY DIAG: Chronic pain of right knee  Muscle weakness (generalized)  RATIONALE FOR EVALUATION AND TREATMENT: Rehabilitation  ONSET DATE: 08/04/21 (acute on chronic, approximate)  FOLLOW UP APPT WITH PROVIDER: Yes, MD recommended follow-up after at least 4 weeks of PT.   FROM INITIAL EVALUATION SUBJECTIVE:                                                                                                                                                                                         Chief Complaint: R knee instability  Pertinent History Pt reports that she fell a few months ago and states "I think I tore the other ligament in my right knee." She fell down her driveway  when taking her recycling can down to the street. She underwent a right total knee arthroplasty about 10-12 years ago at an outside facility in [facility]. Patient relates that surgeon had a difficult time with her surgery as result of the severe valgus osteoarthritis in addition to poor bone quality due to treatments for breast cancer (hx of bilateral mastectomy). 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  She complains of significant instability in her right knee for at least the past 5 years but symptoms have worsened recently. She has been experiencing repeated dislocations in her R knee recently and has had 3-4 falls in the last 6 months. 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 She arrives wearing a hinged R knee brace and ambulating with a single point cane in RUE. She is also wearing a lace up R ankle brace due to recent severe foot pain for which she saw podiatry. She is using a rollator at all times at home. Pt has a history of chronic neck, low back, and bilateral shoulder pain with significant R shoulder weakness (hx of 3 RTC surgeries RUE).  Pain:  Pain Intensity: Present: 0/10, Best: 0/10, Worst: 4/10 Pain location: Medial and lateral joint line Pain quality:  "Intolerable"   Radiating pain: Yes down the RLE Swelling: No  Popping, catching, locking: Yes  Numbness/Tingling: Yes, bilateral feet and fingers Focal weakness or buckling: Yes Aggravating factors: resting on knees, "going too far sideways with the knee" Relieving factors: resting, pt reports she has to relax her muscles for her knee to reduce when it dislocates 24-hour pain behavior: varies depending on activity History of prior back, hip, or knee injury, pain, surgery, or therapy: Yes Falls: Has patient fallen in last 6 months? Yes, Number of  falls: 3-4 falls Dominant hand: right Imaging: No, pt denies any recent imaging of R knee  Prior level of function: Independent with basic ADLs, assist with IADLs Occupational demands: retired  Precautions: None  Weight Bearing Restrictions: No  Living Environment Lives with: lives alone Lives in: House/apartment, lives on one level, adjustable bed, WI shower with grab bars with seat, single point cane and rollator, ramp to enter  Patient Goals: Improve RLE stability   OBJECTIVE:   Patient Surveys  FOTO 43, predicted improvement 56  Cognition Patient is oriented to person, place, and time.  Recent memory is intact.  Remote memory is intact.  Attention span and concentration are intact.  Expressive speech is intact.  Patient's fund of knowledge is within normal limits for educational level.    Gross Musculoskeletal Assessment Tremor: None Bulk: Normal Tone: Normal  GAIT: Distance walked: 100' to enter/exit clinic Assistive device utilized: Single point cane Level of assistance: Modified independence Comments: Pt ambulates with single point cane in RUE.  Decreased self-selected gait speed and decreased step length bilaterally. Decreased stance time on RLE  Posture: Deferred full posture assessment. Pt with limited spinal mobility related to multiple spinal fusions.  AROM AROM (Normal range in degrees) AROM  11/04/2021  Lumbar   Flexion (65)   Extension (30)   Right lateral flexion (25)   Left lateral flexion (25)   Right rotation (30)   Left rotation (30)       Hip Right Left  Flexion (125) WNL WNL  Extension (15)    Abduction (40)    Adduction     Internal Rotation (45)    External Rotation (45)        Knee    Flexion (135) 129 125   Extension (0) 14 hyperextension 11 hyperextension      Ankle    Dorsiflexion (20)    Plantarflexion (50)    Inversion (35)    Eversion (15    (* = pain; Blank rows = not tested)  LE MMT: MMT (out of 5)  Right 12/09/2021 Left 12/09/2021  Hip flexion 5 5  Hip extension    Hip abduction    Hip adduction    Hip internal rotation    Hip external rotation    Knee flexion 5 5  Knee extension 5 5  Ankle dorsiflexion 5 5  Ankle plantarflexion    Ankle inversion    Ankle eversion    (* = pain; Blank rows = not tested)  Sensation Deferred on this date.  Reflexes Deferred  Muscle Length Hamstrings: R: Not examined L: Not examined Quadriceps Michela Pitcher): R: Not examined L: Not examined Hip flexors Maisie Fus): R: Not examined L: Not examined IT band Claiborne Rigg): R: Not examined L: Not examined  Palpation Location LEFT  RIGHT           Quadriceps 0   Medial Hamstrings 0   Lateral Hamstrings 0   Lateral Hamstring tendon 0   Medial Hamstring tendon 0   Quadriceps tendon 0   Patella 0   Patellar Tendon 0   Tibial Tuberosity 0   Medial joint line 1   Lateral joint line 1   MCL 1   LCL 1   Adductor Tubercle    Pes Anserine tendon    Infrapatellar fat pad    Fibular head 0   Popliteal fossa    (Blank rows = not tested) Graded on 0-4 scale (0 = no pain, 1 = pain, 2 = pain with wincing/grimacing/flinching, 3 = pain with withdrawal, 4 = unwilling to allow palpation), (Blank rows = not tested)  Passive Accessory Motion Deferred  VASCULAR: Deferred  SPECIAL TESTS  Ligamentous Stability  ACL: Lachman's: R: Positive L: Negative Active Lachman's: R: Not examined L: Not examined Anterior Drawer: R: Negative L: Negative Pivot Shift: R: Negative L: Negative  PCL: Posterior Drawer: R: Negative L: Negative Reverse Lachman's: R: Negative L: Negative Posterior Sag Sign: R: Negative L: Not examined  MCL: Valgus Stress (30 degrees flexion): R: Positive for laxity and minimal pain L: Not examined  LCL: Varus Stress (30 degrees flexion): R: Positive for laxity and minimal pain L: Not examined  Meniscus Tests McMurray's Test:  Medial Meniscus (Tibial ER): R: Not examined L: Not  examined Lateral Meniscus (Tibial IR): R: Not examined L: Not examined Thessaly: R: Not examined L: Not examined Steinmann Sign I: R: Not examined L: Not examined Steinmann Sign II: R: Not examined L: Not examined  Patellofemoral Pain Syndrome Patellar Tilt (Lateral): R: Negative L: Not examined Patellar Apprehension: R: Negative L: Not examined Squatting pain: R: Not examined L: Not examined Stair climbing pain: R: Not examined L: Not examined Kneeling pain: R: Not examined L: Not examined Resisted knee extension pain: R: Negative L: Negative Compression: R: Negative L: Not examined Clarke's sign: R: Not examined L: Not examined Lateral Pull: R: Not examined L: Not examined  Patellar Tendinopathy Inferior pole palpation with anterior tilt: R: Negative L: Not examined   Beighton Scale: Deferred  [place] Knee Rules for Acute Knee injury  Yes/No         1. Aged 55 years or over No   2. Tenderness at the head of the fibula No   3. Isolated tenderness of the patella  No   4. Inability to flex knee to 90 degrees No   5. Inability to bear weight (defined as an inability to take 4 steps, ie two steps on each leg, regardless of limping) immediately and at presentation No  TODAY'S TREATMENT   SUBJECTIVE: Pt reports that she continues coughing and struggles with a dry throat after using her CPAP machine at night. She has noticed some improved strength/stability in her RLE since starting with therapy. Pt reports 3/10 resting R knee pain upon arrival today. No specific questions currently.   PAIN: 3/10 R knee pain   Ther-ex  Seated: BP: 125/77 mmHg, HR: 101 bpm, SpO2: 99% NuStep L1-3 x 10 minutes BLE only for strengthening, no significant pain today, interval history obtained and therapist monitoring response to challenge and adjusting the resistance; Seated clams with green tband resistance 2 x 15; Seated adductor ball squeezes 2 x 15; Seated marches with 4# ankle weights (AW) 2 x  15 BLE; Seated LAQ with 4# AW 2 x 15 BLE; Seated R hamstring curls with green tband 2 x 15; Seated heel raises with manual resistance by patient x 20 BLE;   Not performed: Supine RLE straight leg hip abduction 2 x 15 BLE, with manual resistance;  Supine RLE straight leg hip adduction 2 x 15 BLE, with manual resistance; Supine RLE heel slides with manually resisted extension 2 x 15; Hooklying bridges with arms at side 2 x 10; Supine RLE quad set 5s hold x 10; Supine RLE SLR with 1# ankle weight (AW) x 10   PATIENT EDUCATION:  Education details: Pt educated throughout session about proper posture and technique with exercises. Improved exercise technique, movement at target joints, use of target muscles after min to mod verbal, visual, tactile cues. Person educated: Patient Education method: Explanation, verbal cues, tactile cues Education comprehension: verbalized understanding and returned demonstration   HOME EXERCISE PROGRAM: Access Code: [email or web address] URL: [email or web address] Date: 11/11/2021 Prepared by: Ria CommentJason Maximus Hoffert  Exercises - Supine Active Straight Leg Raise  - 1 x daily - 7 x weekly - 2 sets - 10 reps - 3s hold - Supine Quad Set (Mirrored)  - 1 x daily - 7 x weekly - 2 sets - 10 reps - 3s hold - Supine Bridge  - 1 x daily - 7 x weekly - 2 sets - 5 reps - 3s hold - Seated Hip Abduction with Resistance  - 1 x daily - 7 x weekly - 2 sets - 10 reps - 3s hold   ASSESSMENT:  CLINICAL IMPRESSION: Continued pain-free RLE strengthening during session today. Due to worsening cough repeated entire session in seated position again today. She is fatigued easily and rest breaks provided between exercises/sets. No HEP updates on this date. Plan is to continue progressive RLE strengthening at future visits as pt is able to tolerate. Patient will benefit from skilled PT to address above impairments and improve overall function.  REHAB POTENTIAL: Fair    CLINICAL DECISION  MAKING: Unstable/unpredictable  EVALUATION COMPLEXITY: High   GOALS: Goals reviewed with patient? No  SHORT TERM GOALS: Target date: 12/02/2021  Pt will be independent with HEP to improve strength and decrease knee pain to improve pain-free function at home. Baseline:  Goal status: INITIAL   LONG TERM GOALS: Target date: 12/30/2021  Pt will increase FOTO to at least 53 to demonstrate significant improvement in function at home and work related to knee pain  Baseline: 11/04/21: 43 Goal status: INITIAL  2.  Pt will decrease worst knee pain by at least 3 points on the NPRS in order to demonstrate clinically significant reduction in knee pain. Baseline: 11/04/21: worst: 4/10 Goal status: INITIAL  3.  Pt will report no further R knee buckling or falls  in order to demonstrate improvement in strength and function and decrease risk for injury related to falls Baseline:  Goal status: INITIAL   PLAN: PT FREQUENCY: 1-2x/week  PT DURATION: 8 weeks  PLANNED INTERVENTIONS: Therapeutic exercises, Therapeutic activity, Neuromuscular re-education, Balance training, Gait training, Patient/Family education, Joint manipulation, Joint mobilization, Canalith repositioning, Aquatic Therapy, Dry Needling, Electrical stimulation, Spinal manipulation, Spinal mobilization, Cryotherapy, Moist heat, Taping, Traction, Ultrasound, Ionotophoresis 4mg /ml Dexamethasone, and Manual therapy  PLAN FOR NEXT SESSION: Progress strengthening for RLE, review and modify HEP as necessary  Clif Serio PT, DPT, G[facility]  Zaira Iacovelli 12/09/2021, 1:23 PM

## 2021-12-10 NOTE — Therapy (Incomplete)
OUTPATIENT PHYSICAL THERAPY KNEE TREATMENT  Patient Name: Marilyn Keith MRN: [id number] DOB:[birth year], 76 y.o., female Today's Date: 12/10/2021    Past Medical History:  Diagnosis Date   Chronic back pain    GERD (gastroesophageal reflux disease)    Hypertension    Past Surgical History:  Procedure Laterality Date   BACK SURGERY     EYE SURGERY     MASTECTOMY     REPLACEMENT TOTAL KNEE BILATERAL     SHOULDER SURGERY Right    Patient Active Problem List   Diagnosis Date Noted   UTI (urinary tract infection) 08/03/2020   Chronic back pain    Hypertension    GERD (gastroesophageal reflux disease)    PCP: Jenell Milliner, MD  REFERRING PROVIDER: Jenell Milliner, MD  REFERRING DIAGNOSIS: Pain due to total right knee replacement, initial encounter (CMS-HCC) T84.84XA  Z96.651   THERAPY DIAG: Chronic pain of right knee  Muscle weakness (generalized)  RATIONALE FOR EVALUATION AND TREATMENT: Rehabilitation  ONSET DATE: 08/04/21 (acute on chronic, approximate)  FOLLOW UP APPT WITH PROVIDER: Yes, MD recommended follow-up after at least 4 weeks of PT.   FROM INITIAL EVALUATION SUBJECTIVE:                                                                                                                                                                                         Chief Complaint: R knee instability  Pertinent History Pt reports that she fell a few months ago and states "I think I tore the other ligament in my right knee." She fell down her driveway when taking her recycling can down to the street. She underwent a right total knee arthroplasty about 10-12 years ago at an outside facility in [facility]. Patient relates that surgeon had a difficult time with her surgery as result of the severe valgus osteoarthritis in addition to poor bone quality due to treatments for breast cancer (hx of bilateral mastectomy). 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  She complains of significant instability in her right knee for at least the past 5 years but symptoms have worsened recently. She has been experiencing repeated dislocations in her R knee recently and has had 3-4 falls in the last 6 months. 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 She arrives wearing a hinged R knee brace and ambulating with a single point cane in RUE. She is also wearing a lace up R ankle brace  due to recent severe foot pain for which she saw podiatry. She is using a rollator at all times at home. Pt has a history of chronic neck, low back, and bilateral shoulder pain with significant R shoulder weakness (hx of 3 RTC surgeries RUE).  Pain:  Pain Intensity: Present: 0/10, Best: 0/10, Worst: 4/10 Pain location: Medial and lateral joint line Pain quality:  "Intolerable"   Radiating pain: Yes down the RLE Swelling: No  Popping, catching, locking: Yes  Numbness/Tingling: Yes, bilateral feet and fingers Focal weakness or buckling: Yes Aggravating factors: resting on knees, "going too far sideways with the knee" Relieving factors: resting, pt reports she has to relax her muscles for her knee to reduce when it dislocates 24-hour pain behavior: varies depending on activity History of prior back, hip, or knee injury, pain, surgery, or therapy: Yes Falls: Has patient fallen in last 6 months? Yes, Number of falls: 3-4 falls Dominant hand: right Imaging: No, pt denies any recent imaging of R knee  Prior level of function: Independent with basic ADLs, assist with IADLs Occupational demands: retired  Precautions: None  Weight Bearing Restrictions: No  Living Environment Lives with: lives alone Lives in: House/apartment, lives on one level, adjustable bed, WI shower with grab bars with seat, single point cane and rollator,  ramp to enter  Patient Goals: Improve RLE stability   OBJECTIVE:   Patient Surveys  FOTO 43, predicted improvement 5253  Cognition Patient is oriented to person, place, and time.  Recent memory is intact.  Remote memory is intact.  Attention span and concentration are intact.  Expressive speech is intact.  Patient's fund of knowledge is within normal limits for educational level.    Gross Musculoskeletal Assessment Tremor: None Bulk: Normal Tone: Normal  GAIT: Distance walked: 100' to enter/exit clinic Assistive device utilized: Single point cane Level of assistance: Modified independence Comments: Pt ambulates with single point cane in RUE.  Decreased self-selected gait speed and decreased step length bilaterally. Decreased stance time on RLE  Posture: Deferred full posture assessment. Pt with limited spinal mobility related to multiple spinal fusions.  AROM AROM (Normal range in degrees) AROM  11/04/2021  Lumbar   Flexion (65)   Extension (30)   Right lateral flexion (25)   Left lateral flexion (25)   Right rotation (30)   Left rotation (30)       Hip Right Left  Flexion (125) WNL WNL  Extension (15)    Abduction (40)    Adduction     Internal Rotation (45)    External Rotation (45)        Knee    Flexion (135) 129 125   Extension (0) 14 hyperextension 11 hyperextension      Ankle    Dorsiflexion (20)    Plantarflexion (50)    Inversion (35)    Eversion (15    (* = pain; Blank rows = not tested)  LE MMT: MMT (out of 5) Right 12/10/2021 Left 12/10/2021  Hip flexion 5 5  Hip extension    Hip abduction    Hip adduction    Hip internal rotation    Hip external rotation    Knee flexion 5 5  Knee extension 5 5  Ankle dorsiflexion 5 5  Ankle plantarflexion    Ankle inversion    Ankle eversion    (* = pain; Blank rows = not tested)  Sensation Deferred on this date.  Reflexes Deferred  Muscle Length Hamstrings: R: Not examined  L: Not  examined Quadriceps Michela Pitcher): R: Not examined L: Not examined Hip flexors Maisie Fus): R: Not examined L: Not examined IT band Claiborne Rigg): R: Not examined L: Not examined  Palpation Location LEFT  RIGHT           Quadriceps 0   Medial Hamstrings 0   Lateral Hamstrings 0   Lateral Hamstring tendon 0   Medial Hamstring tendon 0   Quadriceps tendon 0   Patella 0   Patellar Tendon 0   Tibial Tuberosity 0   Medial joint line 1   Lateral joint line 1   MCL 1   LCL 1   Adductor Tubercle    Pes Anserine tendon    Infrapatellar fat pad    Fibular head 0   Popliteal fossa    (Blank rows = not tested) Graded on 0-4 scale (0 = no pain, 1 = pain, 2 = pain with wincing/grimacing/flinching, 3 = pain with withdrawal, 4 = unwilling to allow palpation), (Blank rows = not tested)  Passive Accessory Motion Deferred  VASCULAR: Deferred  SPECIAL TESTS  Ligamentous Stability  ACL: Lachman's: R: Positive L: Negative Active Lachman's: R: Not examined L: Not examined Anterior Drawer: R: Negative L: Negative Pivot Shift: R: Negative L: Negative  PCL: Posterior Drawer: R: Negative L: Negative Reverse Lachman's: R: Negative L: Negative Posterior Sag Sign: R: Negative L: Not examined  MCL: Valgus Stress (30 degrees flexion): R: Positive for laxity and minimal pain L: Not examined  LCL: Varus Stress (30 degrees flexion): R: Positive for laxity and minimal pain L: Not examined  Meniscus Tests McMurray's Test:  Medial Meniscus (Tibial ER): R: Not examined L: Not examined Lateral Meniscus (Tibial IR): R: Not examined L: Not examined Thessaly: R: Not examined L: Not examined Steinmann Sign I: R: Not examined L: Not examined Steinmann Sign II: R: Not examined L: Not examined  Patellofemoral Pain Syndrome Patellar Tilt (Lateral): R: Negative L: Not examined Patellar Apprehension: R: Negative L: Not examined Squatting pain: R: Not examined L: Not examined Stair climbing pain: R: Not examined L: Not  examined Kneeling pain: R: Not examined L: Not examined Resisted knee extension pain: R: Negative L: Negative Compression: R: Negative L: Not examined Clarke's sign: R: Not examined L: Not examined Lateral Pull: R: Not examined L: Not examined  Patellar Tendinopathy Inferior pole palpation with anterior tilt: R: Negative L: Not examined   Beighton Scale: Deferred  [place] Knee Rules for Acute Knee injury  Yes/No         1. Aged 55 years or over No   2. Tenderness at the head of the fibula No   3. Isolated tenderness of the patella  No   4. Inability to flex knee to 90 degrees No   5. Inability to bear weight (defined as an inability to take 4 steps, ie two steps on each leg, regardless of limping) immediately and at presentation No     TODAY'S TREATMENT   SUBJECTIVE: Pt reports that she continues coughing and struggles with a dry throat after using her CPAP machine at night. She has noticed some improved strength/stability in her RLE since starting with therapy. Pt reports 3/10 resting R knee pain upon arrival today. No specific questions currently.   PAIN: 3/10 R knee pain   Ther-ex  Seated: BP: 125/77 mmHg, HR: 101 bpm, SpO2: 99% NuStep L1-3 x 10 minutes BLE only for strengthening, no significant pain today, interval history obtained and therapist monitoring response to challenge  and adjusting the resistance; Seated clams with green tband resistance 2 x 15; Seated adductor ball squeezes 2 x 15; Seated marches with 4# ankle weights (AW) 2 x 15 BLE; Seated LAQ with 4# AW 2 x 15 BLE; Seated R hamstring curls with green tband 2 x 15; Seated heel raises with manual resistance by patient x 20 BLE;   Not performed: Supine RLE straight leg hip abduction 2 x 15 BLE, with manual resistance;  Supine RLE straight leg hip adduction 2 x 15 BLE, with manual resistance; Supine RLE heel slides with manually resisted extension 2 x 15; Hooklying bridges with arms at side 2 x 10; Supine RLE  quad set 5s hold x 10; Supine RLE SLR with 1# ankle weight (AW) x 10   PATIENT EDUCATION:  Education details: Pt educated throughout session about proper posture and technique with exercises. Improved exercise technique, movement at target joints, use of target muscles after min to mod verbal, visual, tactile cues. Person educated: Patient Education method: Explanation, verbal cues, tactile cues Education comprehension: verbalized understanding and returned demonstration   HOME EXERCISE PROGRAM: Access Code: [email or web address] URL: [email or web address] Date: 11/11/2021 Prepared by: Ria Comment  Exercises - Supine Active Straight Leg Raise  - 1 x daily - 7 x weekly - 2 sets - 10 reps - 3s hold - Supine Quad Set (Mirrored)  - 1 x daily - 7 x weekly - 2 sets - 10 reps - 3s hold - Supine Bridge  - 1 x daily - 7 x weekly - 2 sets - 5 reps - 3s hold - Seated Hip Abduction with Resistance  - 1 x daily - 7 x weekly - 2 sets - 10 reps - 3s hold   ASSESSMENT:  CLINICAL IMPRESSION: Continued pain-free RLE strengthening during session today. Due to worsening cough repeated entire session in seated position again today. She is fatigued easily and rest breaks provided between exercises/sets. No HEP updates on this date. Plan is to continue progressive RLE strengthening at future visits as pt is able to tolerate. Patient will benefit from skilled PT to address above impairments and improve overall function.  REHAB POTENTIAL: Fair    CLINICAL DECISION MAKING: Unstable/unpredictable  EVALUATION COMPLEXITY: High   GOALS: Goals reviewed with patient? No  SHORT TERM GOALS: Target date: 12/02/2021  Pt will be independent with HEP to improve strength and decrease knee pain to improve pain-free function at home. Baseline:  Goal status: INITIAL   LONG TERM GOALS: Target date: 12/30/2021  Pt will increase FOTO to at least 53 to demonstrate significant improvement in function at home and  work related to knee pain  Baseline: 11/04/21: 43 Goal status: INITIAL  2.  Pt will decrease worst knee pain by at least 3 points on the NPRS in order to demonstrate clinically significant reduction in knee pain. Baseline: 11/04/21: worst: 4/10 Goal status: INITIAL  3.  Pt will report no further R knee buckling or falls in order to demonstrate improvement in strength and function and decrease risk for injury related to falls Baseline:  Goal status: INITIAL   PLAN: PT FREQUENCY: 1-2x/week  PT DURATION: 8 weeks  PLANNED INTERVENTIONS: Therapeutic exercises, Therapeutic activity, Neuromuscular re-education, Balance training, Gait training, Patient/Family education, Joint manipulation, Joint mobilization, Canalith repositioning, Aquatic Therapy, Dry Needling, Electrical stimulation, Spinal manipulation, Spinal mobilization, Cryotherapy, Moist heat, Taping, Traction, Ultrasound, Ionotophoresis /ml Dexamethasone, and Manual therapy  PLAN FOR NEXT SESSION: Progress strengthening for RLE, review and modify HEP as necessary  [employer]  D Devonna Oboyle PT, DPT, GCS  Markon Jares 12/10/2021, 12:38 PM

## 2021-12-11 ENCOUNTER — Ambulatory Visit: Payer: Medicare Other

## 2021-12-11 DIAGNOSIS — G8929 Other chronic pain: Secondary | ICD-10-CM

## 2021-12-11 DIAGNOSIS — M25561 Pain in right knee: Secondary | ICD-10-CM | POA: Diagnosis not present

## 2021-12-11 DIAGNOSIS — M6281 Muscle weakness (generalized): Secondary | ICD-10-CM

## 2021-12-11 NOTE — Therapy (Unsigned)
OUTPATIENT PHYSICAL THERAPY KNEE TREATMENT   Patient Name: Marilyn Keith MRN: [id number] DOB:06-13-1945, 76 y.o., female Today's Date: 12/11/2021   PT End of Session - 12/11/21 0851     Visit Number 8    Number of Visits 17    Date for PT Re-Evaluation 12/30/21    Authorization Type Medicare primary; med Pay    Authorization Time Period 11/04/2021-12/30/2021    Progress Note Due on Visit 10    PT Start Time 0845    PT Stop Time 0925    PT Time Calculation (min) 40 min    Equipment Utilized During Treatment --    Activity Tolerance Patient tolerated treatment well;No increased pain    Behavior During Therapy WFL for tasks assessed/performed            Past Medical History:  Diagnosis Date   Chronic back pain    GERD (gastroesophageal reflux disease)    Hypertension    Past Surgical History:  Procedure Laterality Date   BACK SURGERY     EYE SURGERY     MASTECTOMY     REPLACEMENT TOTAL KNEE BILATERAL     SHOULDER SURGERY Right    Patient Active Problem List   Diagnosis Date Noted   UTI (urinary tract infection) 08/03/2020   Chronic back pain    Hypertension    GERD (gastroesophageal reflux disease)    PCP: Jenell Milliner, MD  REFERRING PROVIDER: Paul Half, PA*  REFERRING DIAGNOSIS: Pain due to total right knee replacement, initial encounter (CMS-HCC) T84.84XA  Z96.651   THERAPY DIAG: Chronic pain of right knee  Muscle weakness (generalized)  RATIONALE FOR EVALUATION AND TREATMENT: Rehabilitation  ONSET DATE: 08/04/21 (acute on chronic, approximate)  FOLLOW UP APPT WITH PROVIDER: Yes, MD recommended follow-up after at least 4 weeks of PT.   FROM INITIAL EVALUATION SUBJECTIVE:        SUBJECTIVE: Still coughing without progression. Pain remains similar. Pt works on [employer] when she has time throughout the day.                                                                                                                                                                Chief Complaint: Rt knee instability  Pertinent History Pt reports that she fell a few months ago and states "I think I tore the other ligament in my right knee." She fell down her driveway when taking her recycling can down to the street. She underwent a right total knee arthroplasty about 10-12 years ago at an outside facility in [facility]. Patient relates that surgeon had a difficult time with her surgery as result of the severe valgus osteoarthritis in addition to poor bone quality due to treatments for breast cancer (hx of bilateral mastectomy). She  also underwent a L TKR and now has a leg length difference for which she wears a lift in her R shoe.  She complains of significant instability in her right knee for at least the past 5 years but symptoms have worsened recently. She has been experiencing repeated dislocations in her R knee recently and has had 3-4 falls in the last 6 months. 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 She arrives wearing a hinged R knee brace and ambulating with a single point cane in RUE. She is also wearing a lace up R ankle brace due to recent severe foot pain for which she saw podiatry. She is using a rollator at all times at home. Pt has a history of chronic neck, low back, and bilateral shoulder pain with significant R shoulder weakness (hx of 3 RTC surgeries RUE).  Precautions: None  Patient Goals: Improve RLE stability  OBJECTIVE:   Patient Surveys  FOTO: 47 (12/11/21)    TODAY'S TREATMENT   PAIN: 4/10 R knee pain     Ther-ex  AA/ROM on NuStep L1-3 x 10 minutes BLE; seat 9.5  Seated clams with green tband resistance 2 x 15; Seated adductor ball squeezes 2 x 15; Seated marches with 4# ankle weights (AW) 2 x 15 BLE; Seated LAQ with 4# AW 2 x 15 BLE; Seated R hamstring curls with green tband 2 x 15; Seated heel  raises c 4lb AW x 20 BLE;  PATIENT EDUCATION:  Education details: need to conitnue to monitor cough and FU with MD if progressing  Person educated: Patient Education method: Explanation, verbal cues, tactile cues Education comprehension: verbalized understanding and returned demonstration   HOME EXERCISE PROGRAM: Access Code: [email or web address] URL: [email or web address] Date: 11/11/2021 Prepared by: Ria Comment  Exercises - Supine Active Straight Leg Raise  - 1 x daily - 7 x weekly - 2 sets - 10 reps - 3s hold - Supine Quad Set (Mirrored)  - 1 x daily - 7 x weekly - 2 sets - 10 reps - 3s hold - Supine Bridge  - 1 x daily - 7 x weekly - 2 sets - 5 reps - 3s hold - Seated Hip Abduction with Resistance  - 1 x daily - 7 x weekly - 2 sets - 10 reps - 3s hold   ASSESSMENT:  CLINICAL IMPRESSION: Initiated some assessment of goals and repeat of some tests/measures today given >4 weeks into cert period. Pt continues to adhere to plan of treatment set forth. No pain exacerbation in session. Good motor control of resistance during exercises. No HEP updates on this date. Plan is to continue progressive RLE strengthening at future visits as pt is able to tolerate. Patient will benefit from skilled PT to address above impairments and improve overall function.  REHAB POTENTIAL: Fair    CLINICAL DECISION MAKING: Unstable/unpredictable  EVALUATION COMPLEXITY: High   GOALS: Goals reviewed with patient? No  SHORT TERM GOALS: Target date: 12/02/2021  Pt will be independent with HEP to improve strength and decrease knee pain to improve pain-free function at home. Baseline: 12/11/21: achieved  Goal status: achieved   LONG TERM GOALS: Target date: 12/30/2021  Pt will increase FOTO to at least 53 to demonstrate significant improvement in function at home and work related to knee pain  Baseline: 11/04/21: 43; 12/11/21:  Goal status: on-going   2.  Pt will decrease worst knee  pain by  at least 3 points on the NPRS in order to demonstrate clinically significant reduction in knee pain. Baseline: 11/04/21: worst: 4/10; 11/29: 5/10 Goal status: Progressing  3.  Pt will report no further R knee buckling or falls in order to demonstrate improvement in strength and function and decrease risk for injury related to falls Baseline: 11/29: 1 episode in past week  Goal status: Progressing   PLAN: PT FREQUENCY: 1-2x/week  PT DURATION: 8 weeks  PLANNED INTERVENTIONS: Therapeutic exercises, Therapeutic activity, Neuromuscular re-education, Balance training, Gait training, Patient/Family education, Joint manipulation, Joint mobilization, Canalith repositioning, Aquatic Therapy, Dry Needling, Electrical stimulation, Spinal manipulation, Spinal mobilization, Cryotherapy, Moist heat, Taping, Traction, Ultrasound, Ionotophoresis 4mg /ml Dexamethasone, and Manual therapy  PLAN FOR NEXT SESSION: Progress strengthening for RLE, review and modify HEP as necessary    Daviyon Widmayer C 12/11/2021, 8:55 AM   8:56 AM, 12/11/21 12/13/21, PT, DPT Physical Therapist - [facility] in [place]  [phone] (Office)

## 2021-12-13 NOTE — Therapy (Signed)
OUTPATIENT PHYSICAL THERAPY KNEE TREATMENT   Patient Name: Marilyn Keith MRN: [id number] DOB:10-01-1945, 76 y.o., female Today's Date: 12/16/2021   PT End of Session - 12/16/21 0908     Visit Number 9    Number of Visits 17    Date for PT Re-Evaluation 12/30/21    Authorization Type Medicare primary; med Pay    Authorization Time Period 11/04/2021-12/30/2021    PT Start Time 0849    PT Stop Time 0930    PT Time Calculation (min) 41 min    Activity Tolerance Patient tolerated treatment well;No increased pain    Behavior During Therapy WFL for tasks assessed/performed            Past Medical History:  Diagnosis Date   Chronic back pain    GERD (gastroesophageal reflux disease)    Hypertension    Past Surgical History:  Procedure Laterality Date   BACK SURGERY     EYE SURGERY     MASTECTOMY     REPLACEMENT TOTAL KNEE BILATERAL     SHOULDER SURGERY Right    Patient Active Problem List   Diagnosis Date Noted   UTI (urinary tract infection) 08/03/2020   Chronic back pain    Hypertension    GERD (gastroesophageal reflux disease)    PCP: Jenell Milliner, MD  REFERRING PROVIDER: Paul Half, PA*  REFERRING DIAGNOSIS: Pain due to total right knee replacement, initial encounter (CMS-HCC) T84.84XA  Z96.651   THERAPY DIAG: Chronic pain of right knee  Muscle weakness (generalized)  RATIONALE FOR EVALUATION AND TREATMENT: Rehabilitation  ONSET DATE: 08/04/21 (acute on chronic, approximate)  FOLLOW UP APPT WITH PROVIDER: Yes, MD recommended follow-up after at least 4 weeks of PT.   FROM INITIAL EVALUATION SUBJECTIVE:        SUBJECTIVE: Still coughing without progression. Pain remains similar. Pt works on [employer] when she has time throughout the day.                                                                                                                                                               Chief Complaint: Rt knee instability  Pertinent  History Pt reports that she fell a few months ago and states "I think I tore the other ligament in my right knee." She fell down her driveway when taking her recycling can down to the street. She underwent a right total knee arthroplasty about 10-12 years ago at an outside facility in [facility]. Patient relates that surgeon had a difficult time with her surgery as result of the severe valgus osteoarthritis in addition to poor bone quality due to treatments for breast cancer (hx of bilateral mastectomy). 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  She complains of significant instability in her right knee for at least the past 5 years but symptoms have worsened recently. She has been experiencing repeated dislocations in her R knee recently and has had 3-4 falls in the last 6 months. 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 She arrives wearing a hinged R knee brace and ambulating with a single point cane in RUE. She is also wearing a lace up R ankle brace due to recent severe foot pain for which she saw podiatry. She is using a rollator at all times at home. Pt has a history of chronic neck, low back, and bilateral shoulder pain with significant R shoulder weakness (hx of 3 RTC surgeries RUE).  Precautions: None  Patient Goals: Improve RLE stability  OBJECTIVE:   Patient Surveys  FOTO: 47 (12/11/21)    TODAY'S TREATMENT    SUBJECTIVE: Pt reports that she is doing well today. She denies any resting pain upon arrival. Overall she reports continued improvement in her RLE strength. She would like for the therapist to check her BP because she reports feeling lightheaded recently. She has a nurse visit scheduled today for a BP check.    PAIN: Denies R knee pain    Ther-ex  Checked vitals: initial BP reading was elevated  so pt rested for 5 minutes and then rechecked. BP reading is 131/74 mmHg; AA/ROM on NuStep L1-3 x 10 minutes BLE;  Seated clams with green tband resistance 2 x 20; Seated adductor ball squeezes 2 x 20; Seated marches with 4# ankle weights (AW) 2 x 20 BLE; Seated LAQ with 4# AW 2 x 20 BLE; Seated R hamstring curls with green tband 2 x 20; Seated heel raises c 4lb AW 2 x 20 BLE;   PATIENT EDUCATION:  Education details: Pt educated throughout session about proper posture and technique with exercises. Improved exercise technique, movement at target joints, use of target muscles after min to mod verbal, visual, tactile cues. Blood pressure monitoring Person educated: Patient Education method: Explanation, verbal cues, tactile cues Education comprehension: verbalized understanding and returned demonstration   HOME EXERCISE PROGRAM: Access Code: [email or web address] URL: [email or web address] Date: 11/11/2021 Prepared by: Ria Comment  Exercises - Supine Active Straight Leg Raise  - 1 x daily - 7 x weekly - 2 sets - 10 reps - 3s hold - Supine Quad Set (Mirrored)  - 1 x daily - 7 x weekly - 2 sets - 10 reps - 3s hold - Supine Bridge  - 1 x daily - 7 x weekly - 2 sets - 5 reps - 3s hold - Seated Hip Abduction with Resistance  - 1 x daily - 7 x weekly - 2 sets - 10 reps - 3s hold   ASSESSMENT:  CLINICAL IMPRESSION: Blood pressure readings are slightly elevated today but not significantly. She has a BP check at her PCP office after she leaves therapy. Pt is able to progress exercise reps to 20 during session today. Ankle weights maintained at 4 pounds. No HEP updates on this date. She will need a progress note at next visit. Plan is to continue progressive RLE strengthening at future visits as pt is able to tolerate. Patient will benefit from skilled PT to address above impairments and improve overall function.  REHAB POTENTIAL: Fair    CLINICAL DECISION MAKING:  Unstable/unpredictable  EVALUATION COMPLEXITY: High  GOALS: Goals reviewed with patient? No  SHORT TERM GOALS: Target date: 12/02/2021  Pt will be independent with HEP to improve strength and decrease knee pain to improve pain-free function at home. Baseline: 12/11/21: achieved  Goal status: achieved   LONG TERM GOALS: Target date: 12/30/2021  Pt will increase FOTO to at least 53 to demonstrate significant improvement in function at home and work related to knee pain  Baseline: 11/04/21: 43;  Goal status: on-going   2.  Pt will decrease worst knee pain by at least 3 points on the NPRS in order to demonstrate clinically significant reduction in knee pain. Baseline: 11/04/21: worst: 4/10; 11/29: 5/10 Goal status: Progressing  3.  Pt will report no further R knee buckling or falls in order to demonstrate improvement in strength and function and decrease risk for injury related to falls Baseline: 11/29: 1 episode in past week  Goal status: Progressing   PLAN: PT FREQUENCY: 1-2x/week  PT DURATION: 8 weeks  PLANNED INTERVENTIONS: Therapeutic exercises, Therapeutic activity, Neuromuscular re-education, Balance training, Gait training, Patient/Family education, Joint manipulation, Joint mobilization, Canalith repositioning, Aquatic Therapy, Dry Needling, Electrical stimulation, Spinal manipulation, Spinal mobilization, Cryotherapy, Moist heat, Taping, Traction, Ultrasound, Ionotophoresis 4mg /ml Dexamethasone, and Manual therapy  PLAN FOR NEXT SESSION: Progress strengthening for RLE, review and modify HEP as necessary   Antinette Keough PT, DPT, GCS  Jamen Loiseau 12/16/2021, 10:38 AM

## 2021-12-16 ENCOUNTER — Ambulatory Visit: Payer: Medicare Other | Attending: Physician Assistant

## 2021-12-16 DIAGNOSIS — M25561 Pain in right knee: Secondary | ICD-10-CM | POA: Insufficient documentation

## 2021-12-16 DIAGNOSIS — M6281 Muscle weakness (generalized): Secondary | ICD-10-CM | POA: Insufficient documentation

## 2021-12-16 DIAGNOSIS — G8929 Other chronic pain: Secondary | ICD-10-CM | POA: Diagnosis present

## 2021-12-23 ENCOUNTER — Ambulatory Visit: Payer: Medicare Other

## 2021-12-23 DIAGNOSIS — M25561 Pain in right knee: Secondary | ICD-10-CM | POA: Diagnosis not present

## 2021-12-23 DIAGNOSIS — G8929 Other chronic pain: Secondary | ICD-10-CM

## 2021-12-23 DIAGNOSIS — M6281 Muscle weakness (generalized): Secondary | ICD-10-CM

## 2021-12-23 NOTE — Therapy (Unsigned)
OUTPATIENT PHYSICAL THERAPY KNEE TREATMENT/PROGRESS NOTE  Dates of reporting period  11/04/21   to   12/23/21   Patient Name: Marilyn Keith MRN: [id number] DOB:06/09/1945, 76 y.o., female Today's Date: 12/24/2021   PT End of Session - 12/23/21 0923     Visit Number 10    Number of Visits 17    Date for PT Re-Evaluation 12/30/21    Authorization Type Medicare primary; med Pay    Authorization Time Period 11/04/2021-12/30/2021    PT Start Time 0930    PT Stop Time 1015    PT Time Calculation (min) 45 min    Activity Tolerance Patient tolerated treatment well;No increased pain    Behavior During Therapy WFL for tasks assessed/performed            Past Medical History:  Diagnosis Date   Chronic back pain    GERD (gastroesophageal reflux disease)    Hypertension    Past Surgical History:  Procedure Laterality Date   BACK SURGERY     EYE SURGERY     MASTECTOMY     REPLACEMENT TOTAL KNEE BILATERAL     SHOULDER SURGERY Right    Patient Active Problem List   Diagnosis Date Noted   UTI (urinary tract infection) 08/03/2020   Chronic back pain    Hypertension    GERD (gastroesophageal reflux disease)    PCP: Jenell Milliner, MD  REFERRING PROVIDER: Paul Half, PA*  REFERRING DIAGNOSIS: Pain due to total right knee replacement, initial encounter (CMS-HCC) T84.84XA  Z96.651   THERAPY DIAG: Chronic pain of right knee  Muscle weakness (generalized)  RATIONALE FOR EVALUATION AND TREATMENT: Rehabilitation  ONSET DATE: 08/04/21 (acute on chronic, approximate)  FOLLOW UP APPT WITH PROVIDER: Yes, MD recommended follow-up after at least 4 weeks of PT.   FROM INITIAL EVALUATION SUBJECTIVE:        SUBJECTIVE: Still coughing without progression. Pain remains similar. Pt works on [employer] when she has time throughout the day.                                                                                                                                                                Chief Complaint: Rt knee instability  Pertinent History Pt reports that she fell a few months ago and states "I think I tore the other ligament in my right knee." She fell down her driveway when taking her recycling can down to the street. She underwent a right total knee arthroplasty about 10-12 years ago at an outside facility in [facility]. Patient relates that surgeon had a difficult time with her surgery as result of the severe valgus osteoarthritis in addition to poor bone quality due to treatments for breast cancer (hx of bilateral mastectomy). 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  She complains of significant instability in her right knee for at least the past 5 years but symptoms have worsened recently. She has been experiencing repeated dislocations in her R knee recently and has had 3-4 falls in the last 6 months. 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 She arrives wearing a hinged R knee brace and ambulating with a single point cane in RUE. She is also wearing a lace up R ankle brace due to recent severe foot pain for which she saw podiatry. She is using a rollator at all times at home. Pt has a history of chronic neck, low back, and bilateral shoulder pain with significant R shoulder weakness (hx of 3 RTC surgeries RUE).  Precautions: None  Patient Goals: Improve RLE stability  OBJECTIVE:   Patient Surveys  FOTO: 47 (12/11/21)    TODAY'S TREATMENT    SUBJECTIVE: Pt reports that she is doing well today. She denies any resting pain upon arrival. Overall she reports continued improvement in her RLE strength. MD restarted her on 1/2 dose of her BP medication.   PAIN: Denies R knee pain    Ther-ex  Checked vitals: initial BP reading was elevated so pt rested for 5 minutes and then  rechecked. BP reading is 145/77 mmHg, HR: 95, SpO2: 99%; AA/ROM on NuStep L1-3 BLE only for strengthening x 10 minutes during interval history with therapist adjusting resistance;  Supine SLR 2 x 15 BLE; Hooklying bridges 2 x 15; Hooklying clams with manual resistance 2 x 15; Hooklying adductor squeeze with manual resistance 2 x 15; Supine straight leg hip abduction and adduction with manual resistance x 15 each; Seated marches with 5# ankle weights (AW) 2 x 20 BLE; Seated LAQ with 5# AW 2 x 20 BLE; Seated hamstring curls with green tband 2 x 20 BLE; Seated heel raises with 5# AW and manual resistance 2 x 20 BLE;   PATIENT EDUCATION:  Education details: Pt educated throughout session about proper posture and technique with exercises. Improved exercise technique, movement at target joints, use of target muscles after min to mod verbal, visual, tactile cues.  Person educated: Patient Education method: Explanation, verbal cues, tactile cues Education comprehension: verbalized understanding and returned demonstration   HOME EXERCISE PROGRAM: Access Code: [email or web address] URL: [email or web address] Date: 12/23/2021 Prepared by: Ria Comment  Exercises - Supine Active Straight Leg Raise  - 1 x daily - 7 x weekly - 2 sets - 10 reps - 3s hold - Supine Quad Set (Mirrored)  - 1 x daily - 7 x weekly - 2 sets - 10 reps - 3s hold - Supine Bridge  - 1 x daily - 7 x weekly - 2 sets - 5 reps - 3s hold - Seated Hip Abduction with Resistance  - 1 x daily - 7 x weekly - 2 sets - 10 reps - 3s hold - Seated Hip Adduction Isometrics with Ball  - 1 x daily - 7 x weekly - 2 sets - 10 reps - 3s hold - Seated Long Arc Quad with Ankle Weight  - 1 x daily - 7 x weekly - 2 sets - 10 reps - 3s hold - Seated Heel Raise  - 1 x daily - 7 x weekly - 2 sets - 10 reps - 3s  hold   ASSESSMENT:  CLINICAL IMPRESSION: Updated outcome measures and goals with patient during visit today. Her FOTO score improved  to 53 compared to 43 at the initial evaluation. She reports her worst knee pain has decreased now to 1-2/10 however she continues to have 1-2 episodes of knee buckling each week. Overall she reports increased confidence in her RLE strength and stability and has returned to walking with a single point cane. Updated HEP with patient during session today. Plan is to continue progressive RLE strengthening at future visits as pt is able to tolerate. Patient will benefit from skilled PT to address above impairments and improve overall function.  REHAB POTENTIAL: Fair    CLINICAL DECISION MAKING: Unstable/unpredictable  EVALUATION COMPLEXITY: High   GOALS: Goals reviewed with patient? No  SHORT TERM GOALS: Target date: 12/02/2021  Pt will be independent with HEP to improve strength and decrease knee pain to improve pain-free function at home. Baseline: 12/11/21: achieved  Goal status: ACHIEVED   LONG TERM GOALS: Target date: 12/30/2021  Pt will increase FOTO to at least 53 to demonstrate significant improvement in function at home and work related to knee pain  Baseline: 11/04/21: 43; 12/11/21: 47; 12/23/21: 53  Goal status: ACHIEVED  2.  Pt will decrease worst knee pain by at least 3 points on the NPRS in order to demonstrate clinically significant reduction in knee pain. Baseline: 11/04/21: worst: 4/10; 11/29: 5/10; 12/23/21: 1-2/10; Goal status: ACHIEVED  3.  Pt will report no further R knee buckling or falls in order to demonstrate improvement in strength and function and decrease risk for injury related to falls Baseline: 11/29: 1 episode in past week; 12/23/21: 1-2 episodes in the past week Goal status: ONGOING   PLAN: PT FREQUENCY: 1-2x/week  PT DURATION: 8 weeks  PLANNED INTERVENTIONS: Therapeutic exercises, Therapeutic activity, Neuromuscular re-education, Balance training, Gait training, Patient/Family education, Joint manipulation, Joint mobilization, Canalith  repositioning, Aquatic Therapy, Dry Needling, Electrical stimulation, Spinal manipulation, Spinal mobilization, Cryotherapy, Moist heat, Taping, Traction, Ultrasound, Ionotophoresis 4mg /ml Dexamethasone, and Manual therapy  PLAN FOR NEXT SESSION: Progress strengthening for RLE, review and modify HEP as necessary   Lowell Mcgurk PT, DPT, GCS  Letrell Attwood 12/24/2021, 9:20 AM

## 2021-12-27 ENCOUNTER — Ambulatory Visit: Payer: Medicare Other

## 2021-12-27 DIAGNOSIS — M6281 Muscle weakness (generalized): Secondary | ICD-10-CM

## 2021-12-27 DIAGNOSIS — M25561 Pain in right knee: Secondary | ICD-10-CM | POA: Diagnosis not present

## 2021-12-27 DIAGNOSIS — G8929 Other chronic pain: Secondary | ICD-10-CM

## 2021-12-27 NOTE — Therapy (Signed)
OUTPATIENT PHYSICAL THERAPY KNEE TREATMENT  Patient Name: Marilyn Keith MRN: [id number] DOB:Jul 13, 1945, 76 y.o., female Today's Date: 12/27/2021   PT End of Session - 12/27/21 1103     Visit Number 11    Number of Visits 17    Date for PT Re-Evaluation 12/30/21    Authorization Type Medicare primary; med Pay    Authorization Time Period 11/04/2021-12/30/2021    PT Start Time 1100    PT Stop Time 1145    PT Time Calculation (min) 45 min    Activity Tolerance Patient tolerated treatment well;No increased pain    Behavior During Therapy WFL for tasks assessed/performed            Past Medical History:  Diagnosis Date   Chronic back pain    GERD (gastroesophageal reflux disease)    Hypertension    Past Surgical History:  Procedure Laterality Date   BACK SURGERY     EYE SURGERY     MASTECTOMY     REPLACEMENT TOTAL KNEE BILATERAL     SHOULDER SURGERY Right    Patient Active Problem List   Diagnosis Date Noted   UTI (urinary tract infection) 08/03/2020   Chronic back pain    Hypertension    GERD (gastroesophageal reflux disease)    PCP: Jenell Milliner, MD  REFERRING PROVIDER: Paul Half, PA*  REFERRING DIAGNOSIS: Pain due to total right knee replacement, initial encounter (CMS-HCC) T84.84XA  Z96.651   THERAPY DIAG: Chronic pain of right knee  Muscle weakness (generalized)  RATIONALE FOR EVALUATION AND TREATMENT: Rehabilitation  ONSET DATE: 08/04/21 (acute on chronic, approximate)  FOLLOW UP APPT WITH PROVIDER: Yes, MD recommended follow-up after at least 4 weeks of PT.   FROM INITIAL EVALUATION SUBJECTIVE:        SUBJECTIVE: Still coughing without progression. Pain remains similar. Pt works on [employer] when she has time throughout the day.                                                                                                                                                              Chief Complaint: Rt knee instability  Pertinent  History Pt reports that she fell a few months ago and states "I think I tore the other ligament in my right knee." She fell down her driveway when taking her recycling can down to the street. She underwent a right total knee arthroplasty about 10-12 years ago at an outside facility in [facility]. Patient relates that surgeon had a difficult time with her surgery as result of the severe valgus osteoarthritis in addition to poor bone quality due to treatments for breast cancer (hx of bilateral mastectomy). 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  She complains of significant instability in her right knee for at least the past 5 years but symptoms have worsened recently. She has been experiencing repeated dislocations in her R knee recently and has had 3-4 falls in the last 6 months. 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 She arrives wearing a hinged R knee brace and ambulating with a single point cane in RUE. She is also wearing a lace up R ankle brace due to recent severe foot pain for which she saw podiatry. She is using a rollator at all times at home. Pt has a history of chronic neck, low back, and bilateral shoulder pain with significant R shoulder weakness (hx of 3 RTC surgeries RUE).  Precautions: None  Patient Goals: Improve RLE stability  OBJECTIVE:   Patient Surveys  FOTO: 47 (12/11/21)    TODAY'S TREATMENT    SUBJECTIVE: Pt reports that she is doing well today. She denies any resting pain upon arrival. Overall she reports continued improvement in her RLE strength. She has a follow-up appointment with ortho next week.   PAIN: Denies R knee pain    Ther-ex  AA/ROM on NuStep L1-2 BLE only for strengthening x 10 minutes during interval history with therapist adjusting resistance; Seated marches with 5#  ankle weights (AW) x 20; Seated LAQ with 5# AW 2 x 20;  Standing exercises with 5# AW: Hip flexion marches x 20 BLE; Hamstring curls x 20 BLE; Hip abduction x 20 BLE;  Standing heel raises x 20 BLE; Side stepping in // bars with 5# AW x 6 lengths;   Neuromuscular Re-education  Walking in // bars across blue mat with ankle weights underneath to create an uneven surface. No UE support. Pt struggles to maintain balance.   PATIENT EDUCATION:  Education details: Pt educated throughout session about proper posture and technique with exercises. Improved exercise technique, movement at target joints, use of target muscles after min to mod verbal, visual, tactile cues.  Person educated: Patient Education method: Explanation, verbal cues, tactile cues Education comprehension: verbalized understanding and returned demonstration   HOME EXERCISE PROGRAM: Access Code: [email or web address] URL: [email or web address] Date: 12/23/2021 Prepared by: Ria Comment  Exercises - Supine Active Straight Leg Raise  - 1 x daily - 7 x weekly - 2 sets - 10 reps - 3s hold - Supine Quad Set (Mirrored)  - 1 x daily - 7 x weekly - 2 sets - 10 reps - 3s hold - Supine Bridge  - 1 x daily - 7 x weekly - 2 sets - 5 reps - 3s hold - Seated Hip Abduction with Resistance  - 1 x daily - 7 x weekly - 2 sets - 10 reps - 3s hold - Seated Hip Adduction Isometrics with Ball  - 1 x daily - 7 x weekly - 2 sets - 10 reps - 3s hold - Seated Long Arc Quad with Ankle Weight  - 1 x daily - 7 x weekly - 2 sets - 10 reps - 3s hold - Seated Heel Raise  - 1 x daily - 7 x weekly - 2 sets - 10 reps - 3s hold   ASSESSMENT:  CLINICAL IMPRESSION: Overall she reports increased confidence in her RLE strength and stability and has returned to walking with a single point cane. Progressed strengthening during session today and challenged pt with gait across a soft  uneven surface. She has a follow-up appointment with ortho next week and  will call if he would like for her to return for additional therapy. Otherwise plan is for discharge. Pt encouraged to continue HEP.  REHAB POTENTIAL: Fair    CLINICAL DECISION MAKING: Unstable/unpredictable  EVALUATION COMPLEXITY: High   GOALS: Goals reviewed with patient? No  SHORT TERM GOALS: Target date: 12/02/2021  Pt will be independent with HEP to improve strength and decrease knee pain to improve pain-free function at home. Baseline: 12/11/21: achieved  Goal status: ACHIEVED   LONG TERM GOALS: Target date: 12/30/2021  Pt will increase FOTO to at least 53 to demonstrate significant improvement in function at home and work related to knee pain  Baseline: 11/04/21: 43; 12/11/21: 47; 12/23/21: 53  Goal status: ACHIEVED  2.  Pt will decrease worst knee pain by at least 3 points on the NPRS in order to demonstrate clinically significant reduction in knee pain. Baseline: 11/04/21: worst: 4/10; 11/29: 5/10; 12/23/21: 1-2/10; Goal status: ACHIEVED  3.  Pt will report no further R knee buckling or falls in order to demonstrate improvement in strength and function and decrease risk for injury related to falls Baseline: 11/29: 1 episode in past week; 12/23/21: 1-2 episodes in the past week Goal status: ONGOING   PLAN: PT FREQUENCY: 1-2x/week  PT DURATION: 8 weeks  PLANNED INTERVENTIONS: Therapeutic exercises, Therapeutic activity, Neuromuscular re-education, Balance training, Gait training, Patient/Family education, Joint manipulation, Joint mobilization, Canalith repositioning, Aquatic Therapy, Dry Needling, Electrical stimulation, Spinal manipulation, Spinal mobilization, Cryotherapy, Moist heat, Taping, Traction, Ultrasound, Ionotophoresis 4mg /ml Dexamethasone, and Manual therapy  PLAN FOR NEXT SESSION: Progress strengthening for RLE, review and modify HEP as necessary   Tyrisha Benninger PT, DPT, GCS  Wylie Coon 12/27/2021, 10:03 PM

## 2021-12-30 ENCOUNTER — Ambulatory Visit: Payer: Medicare Other

## 2022-01-01 ENCOUNTER — Ambulatory Visit: Payer: Medicare Other

## 2022-01-16 ENCOUNTER — Ambulatory Visit: Payer: Medicare Other | Attending: Physician Assistant

## 2022-01-16 DIAGNOSIS — M25561 Pain in right knee: Secondary | ICD-10-CM | POA: Diagnosis not present

## 2022-01-16 DIAGNOSIS — M6281 Muscle weakness (generalized): Secondary | ICD-10-CM | POA: Insufficient documentation

## 2022-01-16 DIAGNOSIS — G8929 Other chronic pain: Secondary | ICD-10-CM | POA: Insufficient documentation

## 2022-01-16 NOTE — Therapy (Signed)
OUTPATIENT PHYSICAL THERAPY KNEE TREATMENT/RECERTIFICATION  Patient Name: Marilyn Keith MRN: [id number] DOB:[birth year], 77 y.o., female Today's Date: 01/16/2022   PT End of Session - 01/16/22 0906     Visit Number 12    Number of Visits 33    Date for PT Re-Evaluation 03/13/22    Authorization Type Medicare primary; med Pay    Authorization Time Period 11/04/2021-12/30/2021    PT Start Time 0850    PT Stop Time 0930    PT Time Calculation (min) 40 min    Activity Tolerance Patient tolerated treatment well;No increased pain    Behavior During Therapy WFL for tasks assessed/performed            Past Medical History:  Diagnosis Date   Chronic back pain    GERD (gastroesophageal reflux disease)    Hypertension    Past Surgical History:  Procedure Laterality Date   BACK SURGERY     EYE SURGERY     MASTECTOMY     REPLACEMENT TOTAL KNEE BILATERAL     SHOULDER SURGERY Right    Patient Active Problem List   Diagnosis Date Noted   UTI (urinary tract infection) 08/03/2020   Chronic back pain    Hypertension    GERD (gastroesophageal reflux disease)    PCP: Danae Orleans, MD  REFERRING PROVIDER: Rubbie Battiest, PA*  REFERRING DIAGNOSIS: Pain due to total right knee replacement, initial encounter (CMS-HCC) T84.84XA  Z96.651   THERAPY DIAG: Chronic pain of right knee  Muscle weakness (generalized)  RATIONALE FOR EVALUATION AND TREATMENT: Rehabilitation  ONSET DATE: 08/04/21 (acute on chronic, approximate)  FOLLOW UP APPT WITH PROVIDER: Yes, MD recommended follow-up after at least 4 weeks of PT.   FROM INITIAL EVALUATION SUBJECTIVE:        SUBJECTIVE: Still coughing without progression. Pain remains similar. Pt works on [employer] when she has time throughout the day.                                                                                                                                                              Chief Complaint: Rt knee  instability  Pertinent History Pt reports that she fell a few months ago and states "I think I tore the other ligament in my right knee." She fell down her driveway when taking her recycling can down to the street. She underwent a right total knee arthroplasty about 10-12 years ago at an outside facility in [facility]. Patient relates that surgeon had a difficult time with her surgery as result of the severe valgus osteoarthritis in addition to poor bone quality due to treatments for breast cancer (hx of bilateral mastectomy). 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  She complains of significant instability in her right knee for at least the past 5 years but symptoms have worsened recently. She has been experiencing repeated dislocations in her R knee recently and has had 3-4 falls in the last 6 months. 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 She arrives wearing a hinged R knee brace and ambulating with a single point cane in RUE. She is also wearing a lace up R ankle brace due to recent severe foot pain for which she saw podiatry. She is using a rollator at all times at home. Pt has a history of chronic neck, low back, and bilateral shoulder pain with significant R shoulder weakness (hx of 3 RTC surgeries RUE).  Precautions: None  Patient Goals: Improve RLE stability  OBJECTIVE:   Patient Surveys  FOTO: 47 (12/11/21)    TODAY'S TREATMENT    SUBJECTIVE: Pt reports that she is doing well today. She denies any resting pain upon arrival. She saw orthopedics who wanted her to continue physical therapy. Overall she reports continued improvement in her RLE strength. She has a follow-up appointment with ortho next week.   PAIN: Denies R knee pain    Ther-ex  AA/ROM on NuStep L1-2 BLE only for strengthening x 10  minutes during interval history with therapist adjusting resistance; Updated outcome measures with patient (see goal section); Seated marches with 5# ankle weights (AW) 2 x 20; Seated LAQ with 5# AW 2 x 20; Seated R hamstring curls with blue tband 2 x 20; Seated adductor ball squeeze 2 x 20; Seated clams with blue tband 2 x 20;  Standing exercises with 5# AW: Hip flexion marches x 20 BLE; Hamstring extension x 20 BLE; Hip abduction x 20 BLE;   PATIENT EDUCATION:  Education details: Pt educated throughout session about proper posture and technique with exercises. Improved exercise technique, movement at target joints, use of target muscles after min to mod verbal, visual, tactile cues.  Person educated: Patient Education method: Explanation, verbal cues, tactile cues Education comprehension: verbalized understanding and returned demonstration   HOME EXERCISE PROGRAM: Access Code: [email or web address] URL: [email or web address] Date: 12/23/2021 Prepared by: Ria Comment  Exercises - Supine Active Straight Leg Raise  - 1 x daily - 7 x weekly - 2 sets - 10 reps - 3s hold - Supine Quad Set (Mirrored)  - 1 x daily - 7 x weekly - 2 sets - 10 reps - 3s hold - Supine Bridge  - 1 x daily - 7 x weekly - 2 sets - 5 reps - 3s hold - Seated Hip Abduction with Resistance  - 1 x daily - 7 x weekly - 2 sets - 10 reps - 3s hold - Seated Hip Adduction Isometrics with Ball  - 1 x daily - 7 x weekly - 2 sets - 10 reps - 3s hold - Seated Long Arc Quad with Ankle Weight  - 1 x daily - 7 x weekly - 2 sets - 10 reps - 3s hold - Seated Heel Raise  - 1 x daily - 7 x weekly - 2 sets - 10 reps - 3s hold   ASSESSMENT:  CLINICAL IMPRESSION: Updated outcome measures and goals with patient during visit today. Her FOTO score declined from 53 at last visit to 13 today which is what she scored at the initial evaluation. She reports her worst knee pain  has decreased now to "0.5/10" compared to 4/10 at the  initial evaluation. She has not had any episodes of buckling over the last week. Overall she reports increased confidence in her RLE strength and stability and has returned to walking with a single point cane. Plan is to continue progressive RLE strengthening at future visits as pt is able to tolerate. Patient will benefit from skilled PT to address above impairments and improve overall function.  REHAB POTENTIAL: Fair    CLINICAL DECISION MAKING: Unstable/unpredictable  EVALUATION COMPLEXITY: High   GOALS: Goals reviewed with patient? Yes  SHORT TERM GOALS:   Pt will be independent with HEP to improve strength and decrease knee pain to improve pain-free function at home. Baseline: 12/11/21: achieved  Goal status: ACHIEVED   LONG TERM GOALS: Target date: 03/13/2022   Pt will increase FOTO to at least 53 to demonstrate significant improvement in function at home and work related to knee pain  Baseline: 11/04/21: 43; 12/11/21: 47; 12/23/21: 53; 01/16/22: 43; Goal status: ONGOING  2.  Pt will decrease worst knee pain by at least 3 points on the NPRS in order to demonstrate clinically significant reduction in knee pain. Baseline: 11/04/21: worst: 4/10; 11/29: 5/10; 12/23/21: 1-2/10; 01/16/22: "0.5/10" Goal status: ACHIEVED  3.  Pt will report no further R knee buckling or falls in order to demonstrate improvement in strength and function and decrease risk for injury related to falls Baseline: 11/29: 1 episode in past week; 12/23/21: 1-2 episodes in the past week; 01/16/22: No episodes of buckling over the last week Goal status: ACHIEVED   PLAN: PT FREQUENCY: 1-2x/week  PT DURATION: 8 weeks  PLANNED INTERVENTIONS: Therapeutic exercises, Therapeutic activity, Neuromuscular re-education, Balance training, Gait training, Patient/Family education, Joint manipulation, Joint mobilization, Canalith repositioning, Aquatic Therapy, Dry Needling, Electrical stimulation, Spinal manipulation, Spinal  mobilization, Cryotherapy, Moist heat, Taping, Traction, Ultrasound, Ionotophoresis 4mg /ml Dexamethasone, and Manual therapy  PLAN FOR NEXT SESSION: Progress strengthening for RLE, review and modify HEP as necessary   Lyndel Safe Wyndell Cardiff PT, DPT, GCS  Marilyn Keith 01/16/2022, 10:50 AM

## 2022-01-20 ENCOUNTER — Ambulatory Visit: Payer: Medicare Other

## 2022-01-20 DIAGNOSIS — M6281 Muscle weakness (generalized): Secondary | ICD-10-CM

## 2022-01-20 DIAGNOSIS — M25561 Pain in right knee: Secondary | ICD-10-CM | POA: Diagnosis not present

## 2022-01-20 DIAGNOSIS — G8929 Other chronic pain: Secondary | ICD-10-CM

## 2022-01-20 NOTE — Therapy (Signed)
OUTPATIENT PHYSICAL THERAPY KNEE TREATMENT  Patient Name: Marilyn Keith MRN: [id number] DOB:Jun 17, 1945, 77 y.o., female Today's Date: 01/20/2022   PT End of Session - 01/20/22 0854     Visit Number 13    Number of Visits 33    Date for PT Re-Evaluation 03/13/22    Authorization Type Medicare primary; med Pay    Authorization Time Period 11/04/2021-12/30/2021    PT Start Time 0852    PT Stop Time 0930    PT Time Calculation (min) 38 min    Activity Tolerance Patient tolerated treatment well;No increased pain    Behavior During Therapy WFL for tasks assessed/performed            Past Medical History:  Diagnosis Date   Chronic back pain    GERD (gastroesophageal reflux disease)    Hypertension    Past Surgical History:  Procedure Laterality Date   BACK SURGERY     EYE SURGERY     MASTECTOMY     REPLACEMENT TOTAL KNEE BILATERAL     SHOULDER SURGERY Right    Patient Active Problem List   Diagnosis Date Noted   UTI (urinary tract infection) 08/03/2020   Chronic back pain    Hypertension    GERD (gastroesophageal reflux disease)    PCP: Danae Orleans, MD  REFERRING PROVIDER: Rubbie Battiest, PA*  REFERRING DIAGNOSIS: Pain due to total right knee replacement, initial encounter (CMS-HCC) T84.84XA  Z96.651   THERAPY DIAG: Chronic pain of right knee  Muscle weakness (generalized)  RATIONALE FOR EVALUATION AND TREATMENT: Rehabilitation  ONSET DATE: 08/04/21 (acute on chronic, approximate)  FOLLOW UP APPT WITH PROVIDER: Yes, MD recommended follow-up after at least 4 weeks of PT.   FROM INITIAL EVALUATION SUBJECTIVE:        SUBJECTIVE: Still coughing without progression. Pain remains similar. Pt works on [employer] when she has time throughout the day.                                                                                                                                                              Chief Complaint: Rt knee instability  Pertinent  History Pt reports that she fell a few months ago and states "I think I tore the other ligament in my right knee." She fell down her driveway when taking her recycling can down to the street. She underwent a right total knee arthroplasty about 10-12 years ago at an outside facility in [facility]. Patient relates that surgeon had a difficult time with her surgery as result of the severe valgus osteoarthritis in addition to poor bone quality due to treatments for breast cancer (hx of bilateral mastectomy). 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  She complains of significant instability in her right knee for at least the past 5 years but symptoms have worsened recently. She has been experiencing repeated dislocations in her R knee recently and has had 3-4 falls in the last 6 months. 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 She arrives wearing a hinged R knee brace and ambulating with a single point cane in RUE. She is also wearing a lace up R ankle brace due to recent severe foot pain for which she saw podiatry. She is using a rollator at all times at home. Pt has a history of chronic neck, low back, and bilateral shoulder pain with significant R shoulder weakness (hx of 3 RTC surgeries RUE).  Precautions: None  Patient Goals: Improve RLE stability  OBJECTIVE:   Patient Surveys  FOTO: 47 (12/11/21)    TODAY'S TREATMENT    SUBJECTIVE: Pt reports that she is doing well today. She denies any resting pain upon arrival. Overall she reports continued improvement in her RLE strength. No stumbles or falls since the last therapy session and no significant changes.   PAIN: Denies R knee pain    Ther-ex  Seated marches with 5# ankle weights (AW) 2 x 25; Seated LAQ with 5# AW 2 x 25; Seated R hamstring curls with blue  tband 2 x 25; Seated adductor ball squeeze 2 x 25; Seated clams with blue tband 2 x 25; Mini squats 2 x 15; Sit to stand from elevated chair with Airex pad on seat and no UE support 2 x 10;  Standing exercises with 5# AW: Hip flexion marches x 25 BLE; Hip abduction x 25 BLE; Hamstring extension x 25 BLE; Hip abduction x 25 BLE;  AA/ROM on NuStep L1-2 BLE only for strengthening x 10 minutes with therapist adjusting resistance as needed and monitoring challenge;   PATIENT EDUCATION:  Education details: Pt educated throughout session about proper posture and technique with exercises. Improved exercise technique, movement at target joints, use of target muscles after min to mod verbal, visual, tactile cues.  Person educated: Patient Education method: Explanation, verbal cues, tactile cues Education comprehension: verbalized understanding and returned demonstration   HOME EXERCISE PROGRAM: Access Code: [email or web address] URL: [email or web address] Date: 12/23/2021 Prepared by: Ria Comment  Exercises - Supine Active Straight Leg Raise  - 1 x daily - 7 x weekly - 2 sets - 10 reps - 3s hold - Supine Quad Set (Mirrored)  - 1 x daily - 7 x weekly - 2 sets - 10 reps - 3s hold - Supine Bridge  - 1 x daily - 7 x weekly - 2 sets - 5 reps - 3s hold - Seated Hip Abduction with Resistance  - 1 x daily - 7 x weekly - 2 sets - 10 reps - 3s hold - Seated Hip Adduction Isometrics with Ball  - 1 x daily - 7 x weekly - 2 sets - 10 reps - 3s hold - Seated Long Arc Quad with Ankle Weight  - 1 x daily - 7 x weekly - 2 sets - 10 reps - 3s hold - Seated Heel Raise  - 1 x daily - 7 x weekly - 2 sets - 10 reps - 3s hold   ASSESSMENT:  CLINICAL IMPRESSION: Pt demonstrates excellent motivation during session today. Progressed repetitions to 25 during most exercises today and maintained ankle weights at  5 pounds. She continues to require intermittent seated rest breaks. No HEP updates on this date.  Plan is to continue progressive RLE strengthening at future visits as pt is able to tolerate. Patient will benefit from skilled PT to address above impairments and improve overall function.Marland Kitchen  REHAB POTENTIAL: Fair    CLINICAL DECISION MAKING: Unstable/unpredictable  EVALUATION COMPLEXITY: High   GOALS: Goals reviewed with patient? Yes  SHORT TERM GOALS:   Pt will be independent with HEP to improve strength and decrease knee pain to improve pain-free function at home. Baseline: 12/11/21: achieved  Goal status: ACHIEVED   LONG TERM GOALS: Target date: 03/13/2022   Pt will increase FOTO to at least 53 to demonstrate significant improvement in function at home and work related to knee pain  Baseline: 11/04/21: 43; 12/11/21: 47; 12/23/21: 53; 01/16/22: 43; Goal status: ONGOING  2.  Pt will decrease worst knee pain by at least 3 points on the NPRS in order to demonstrate clinically significant reduction in knee pain. Baseline: 11/04/21: worst: 4/10; 11/29: 5/10; 12/23/21: 1-2/10; 01/16/22: "0.5/10" Goal status: ACHIEVED  3.  Pt will report no further R knee buckling or falls in order to demonstrate improvement in strength and function and decrease risk for injury related to falls Baseline: 11/29: 1 episode in past week; 12/23/21: 1-2 episodes in the past week; 01/16/22: No episodes of buckling over the last week Goal status: ACHIEVED   PLAN: PT FREQUENCY: 1-2x/week  PT DURATION: 8 weeks  PLANNED INTERVENTIONS: Therapeutic exercises, Therapeutic activity, Neuromuscular re-education, Balance training, Gait training, Patient/Family education, Joint manipulation, Joint mobilization, Canalith repositioning, Aquatic Therapy, Dry Needling, Electrical stimulation, Spinal manipulation, Spinal mobilization, Cryotherapy, Moist heat, Taping, Traction, Ultrasound, Ionotophoresis 4mg /ml Dexamethasone, and Manual therapy  PLAN FOR NEXT SESSION: Progress strengthening for RLE, review and modify HEP as  necessary   Sharelle Burditt PT, DPT, GCS  Stony Stegmann 01/20/2022, 1:03 PM

## 2022-01-21 NOTE — Therapy (Signed)
OUTPATIENT PHYSICAL THERAPY KNEE TREATMENT  Patient Name: Marilyn Keith MRN: [id number] DOB:[birth year], 77 y.o., female Today's Date: 01/21/2022    Past Medical History:  Diagnosis Date   Chronic back pain    GERD (gastroesophageal reflux disease)    Hypertension    Past Surgical History:  Procedure Laterality Date   BACK SURGERY     EYE SURGERY     MASTECTOMY     REPLACEMENT TOTAL KNEE BILATERAL     SHOULDER SURGERY Right    Patient Active Problem List   Diagnosis Date Noted   UTI (urinary tract infection) 08/03/2020   Chronic back pain    Hypertension    GERD (gastroesophageal reflux disease)    PCP: Jenell Milliner, MD  REFERRING PROVIDER: Jenell Milliner, MD  REFERRING DIAGNOSIS: Pain due to total right knee replacement, initial encounter (CMS-HCC) T84.84XA  Z96.651   THERAPY DIAG: Chronic pain of right knee  Muscle weakness (generalized)  RATIONALE FOR EVALUATION AND TREATMENT: Rehabilitation  ONSET DATE: 08/04/21 (acute on chronic, approximate)  FOLLOW UP APPT WITH PROVIDER: Yes, MD recommended follow-up after at least 4 weeks of PT.   FROM INITIAL EVALUATION SUBJECTIVE:        SUBJECTIVE: Still coughing without progression. Pain remains similar. Pt works on [employer] when she has time throughout the day.                                                                                                                                                              Chief Complaint: Rt knee instability  Pertinent History Pt reports that she fell a few months ago and states "I think I tore the other ligament in my right knee." She fell down her driveway when taking her recycling can down to the street. She underwent a right total knee arthroplasty about 10-12 years ago at an outside facility in [facility]. Patient relates that surgeon had a difficult time with her surgery as result of the severe valgus osteoarthritis in addition to poor bone quality due to  treatments for breast cancer (hx of bilateral mastectomy). 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  She complains of significant instability in her right knee for at least the past 5 years but symptoms have worsened recently. She has been experiencing repeated dislocations in her R knee recently and has had 3-4 falls in the last 6 months. 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 She arrives wearing a hinged R knee brace and ambulating with a single point cane in RUE. She is also wearing a lace up R ankle brace due  to recent severe foot pain for which she saw podiatry. She is using a rollator at all times at home. Pt has a history of chronic neck, low back, and bilateral shoulder pain with significant R shoulder weakness (hx of 3 RTC surgeries RUE).  Precautions: None  Patient Goals: Improve RLE stability  OBJECTIVE:   Patient Surveys  FOTO: 47 (12/11/21)    TODAY'S TREATMENT    SUBJECTIVE: Pt reports that she is doing well today. She denies any resting pain upon arrival. Overall she reports continued improvement in her RLE strength. No stumbles or falls since the last therapy session and no significant changes.   PAIN: Denies R knee pain    Ther-ex  Seated marches with 5# ankle weights (AW) 2 x 25; Seated LAQ with 5# AW 2 x 25; Seated R hamstring curls with blue tband 2 x 25; Seated adductor ball squeeze 2 x 25; Seated clams with blue tband 2 x 25; Mini squats 2 x 15; Sit to stand from elevated chair with Airex pad on seat and no UE support 2 x 10;  Standing exercises with 5# AW: Hip flexion marches x 25 BLE; Hip abduction x 25 BLE; Hamstring extension x 25 BLE; Hip abduction x 25 BLE;  AA/ROM on NuStep L1-2 BLE only for strengthening x 10 minutes with therapist adjusting resistance as needed and monitoring  challenge;   PATIENT EDUCATION:  Education details: Pt educated throughout session about proper posture and technique with exercises. Improved exercise technique, movement at target joints, use of target muscles after min to mod verbal, visual, tactile cues.  Person educated: Patient Education method: Explanation, verbal cues, tactile cues Education comprehension: verbalized understanding and returned demonstration   HOME EXERCISE PROGRAM: Access Code: [email or web address] URL: [email or web address] Date: 12/23/2021 Prepared by: Roxana Hires  Exercises - Supine Active Straight Leg Raise  - 1 x daily - 7 x weekly - 2 sets - 10 reps - 3s hold - Supine Quad Set (Mirrored)  - 1 x daily - 7 x weekly - 2 sets - 10 reps - 3s hold - Supine Bridge  - 1 x daily - 7 x weekly - 2 sets - 5 reps - 3s hold - Seated Hip Abduction with Resistance  - 1 x daily - 7 x weekly - 2 sets - 10 reps - 3s hold - Seated Hip Adduction Isometrics with Ball  - 1 x daily - 7 x weekly - 2 sets - 10 reps - 3s hold - Seated Long Arc Quad with Ankle Weight  - 1 x daily - 7 x weekly - 2 sets - 10 reps - 3s hold - Seated Heel Raise  - 1 x daily - 7 x weekly - 2 sets - 10 reps - 3s hold   ASSESSMENT:  CLINICAL IMPRESSION: Pt demonstrates excellent motivation during session today. Progressed repetitions to 25 during most exercises today and maintained ankle weights at 5 pounds. She continues to require intermittent seated rest breaks. No HEP updates on this date. Plan is to continue progressive RLE strengthening at future visits as pt is able to tolerate. Patient will benefit from skilled PT to address above impairments and improve overall function.Marland Kitchen  REHAB POTENTIAL: Fair    CLINICAL DECISION MAKING: Unstable/unpredictable  EVALUATION COMPLEXITY: High   GOALS: Goals reviewed with patient? Yes  SHORT TERM GOALS:   Pt will be independent with HEP to improve strength and decrease knee pain to improve pain-free  function at home. Baseline: 12/11/21: achieved  Goal  status: ACHIEVED   LONG TERM GOALS: Target date: 03/13/2022   Pt will increase FOTO to at least 53 to demonstrate significant improvement in function at home and work related to knee pain  Baseline: 11/04/21: 43; 12/11/21: 47; 12/23/21: 53; 01/16/22: 43; Goal status: ONGOING  2.  Pt will decrease worst knee pain by at least 3 points on the NPRS in order to demonstrate clinically significant reduction in knee pain. Baseline: 11/04/21: worst: 4/10; 11/29: 5/10; 12/23/21: 1-2/10; 01/16/22: "0.5/10" Goal status: ACHIEVED  3.  Pt will report no further R knee buckling or falls in order to demonstrate improvement in strength and function and decrease risk for injury related to falls Baseline: 11/29: 1 episode in past week; 12/23/21: 1-2 episodes in the past week; 01/16/22: No episodes of buckling over the last week Goal status: ACHIEVED   PLAN: PT FREQUENCY: 1-2x/week  PT DURATION: 8 weeks  PLANNED INTERVENTIONS: Therapeutic exercises, Therapeutic activity, Neuromuscular re-education, Balance training, Gait training, Patient/Family education, Joint manipulation, Joint mobilization, Canalith repositioning, Aquatic Therapy, Dry Needling, Electrical stimulation, Spinal manipulation, Spinal mobilization, Cryotherapy, Moist heat, Taping, Traction, Ultrasound, Ionotophoresis 4mg /ml Dexamethasone, and Manual therapy  PLAN FOR NEXT SESSION: Progress strengthening for RLE, review and modify HEP as necessary   Yosiah Jasmin PT, DPT, GCS  Lilo Wallington 01/21/2022, 2:30 PM

## 2022-01-22 ENCOUNTER — Ambulatory Visit: Payer: Medicare Other

## 2022-01-22 DIAGNOSIS — M6281 Muscle weakness (generalized): Secondary | ICD-10-CM

## 2022-01-22 DIAGNOSIS — G8929 Other chronic pain: Secondary | ICD-10-CM

## 2022-01-22 DIAGNOSIS — M25561 Pain in right knee: Secondary | ICD-10-CM | POA: Diagnosis not present

## 2022-01-28 NOTE — Therapy (Signed)
OUTPATIENT PHYSICAL THERAPY KNEE TREATMENT  Patient Name: Alexiana Laverdure MRN: [id number] DOB:1945-12-19, 77 y.o., female Today's Date: 01/29/2022   PT End of Session - 01/29/22 0921     Visit Number 15    Number of Visits 33    Date for PT Re-Evaluation 03/13/22    Authorization Type Medicare primary; med Pay    Authorization Time Period 11/04/2021-12/30/2021    PT Start Time 0930    PT Stop Time 1015    PT Time Calculation (min) 45 min    Activity Tolerance Patient tolerated treatment well;No increased pain    Behavior During Therapy WFL for tasks assessed/performed            Past Medical History:  Diagnosis Date   Chronic back pain    GERD (gastroesophageal reflux disease)    Hypertension    Past Surgical History:  Procedure Laterality Date   BACK SURGERY     EYE SURGERY     MASTECTOMY     REPLACEMENT TOTAL KNEE BILATERAL     SHOULDER SURGERY Right    Patient Active Problem List   Diagnosis Date Noted   UTI (urinary tract infection) 08/03/2020   Chronic back pain    Hypertension    GERD (gastroesophageal reflux disease)    PCP: Jenell Milliner, MD  REFERRING PROVIDER: Paul Half, PA*  REFERRING DIAGNOSIS: Pain due to total right knee replacement, initial encounter (CMS-HCC) T84.84XA  Z96.651   THERAPY DIAG: Chronic pain of right knee  Muscle weakness (generalized)  RATIONALE FOR EVALUATION AND TREATMENT: Rehabilitation  ONSET DATE: 08/04/21 (acute on chronic, approximate)  FOLLOW UP APPT WITH PROVIDER: Yes, MD recommended follow-up after at least 4 weeks of PT.   FROM INITIAL EVALUATION SUBJECTIVE:        SUBJECTIVE: Still coughing without progression. Pain remains similar. Pt works on [employer] when she has time throughout the day.                                                                                                                                                              Chief Complaint: Rt knee instability  Pertinent  History Pt reports that she fell a few months ago and states "I think I tore the other ligament in my right knee." She fell down her driveway when taking her recycling can down to the street. She underwent a right total knee arthroplasty about 10-12 years ago at an outside facility in [facility]. Patient relates that surgeon had a difficult time with her surgery as result of the severe valgus osteoarthritis in addition to poor bone quality due to treatments for breast cancer (hx of bilateral mastectomy). 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  She complains of significant instability in her right knee for at least the past 5 years but symptoms have worsened recently. She has been experiencing repeated dislocations in her R knee recently and has had 3-4 falls in the last 6 months. 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 She arrives wearing a hinged R knee brace and ambulating with a single point cane in RUE. She is also wearing a lace up R ankle brace due to recent severe foot pain for which she saw podiatry. She is using a rollator at all times at home. Pt has a history of chronic neck, low back, and bilateral shoulder pain with significant R shoulder weakness (hx of 3 RTC surgeries RUE).  Precautions: None  Patient Goals: Improve RLE stability  OBJECTIVE:   Patient Surveys  FOTO: 47 (12/11/21)    TODAY'S TREATMENT    SUBJECTIVE: Pt reports that she is doing alright today. She reports 1/10 R knee pain upon arrival. No stumbles or falls since the last therapy session. No significant changes recently and no specific questions or concerns.   PAIN: 1/10 R knee pain    Ther-ex  Seated marches with 5# ankle weights (AW) 2 x 30 BLE; Seated LAQ with 5# AW 2 x 30 BLE; Seated hamstring curls with blue tband 2 x 30  BLE; Seated clams with blue tband 2 x 30; Seated adductor ball squeeze 2 x 30;  Standing exercises with 5# AW: Hip flexion marches x 30 BLE; Hip abduction x 30 BLE; Hamstring extension x 30 BLE; Hip abduction x 30 BLE;  AA/ROM on NuStep L1-3 BLE only for strengthening x 10 minutes with therapist monitoring challenge;   Not performed: Sit to stand from elevated chair with Airex pad on seat and no UE support 2 x 10; Supine RLE quad set 5s hold 2 x 10; Supine RLE SLR with 2# ankle weight (AW) 2 x 10 Supine RLE straight leg hip abduction 2 x 10, with manual resistance;  Supine RLE straight leg hip adduction 2 x 10, with manual resistance; Supine RLE heel slides with manually resisted extension 2 x 10; Hooklying bridges with arms at side 2 x 10; Hooklying adductor squeeze with manual resistance 2 x 10; Hooklying clams with manual resistance 2 x 10; Mini squats 2 x 15;   PATIENT EDUCATION:  Education details: Pt educated throughout session about proper posture and technique with exercises. Improved exercise technique, movement at target joints, use of target muscles after min to mod verbal, visual, tactile cues.  Person educated: Patient Education method: Explanation, verbal cues, tactile cues Education comprehension: verbalized understanding and returned demonstration   HOME EXERCISE PROGRAM: Access Code: [email or web address] URL: [email or web address] Date: 12/23/2021 Prepared by: Roxana Hires  Exercises - Supine Active Straight Leg Raise  - 1 x daily - 7 x weekly - 2 sets - 10 reps - 3s hold - Supine Quad Set (Mirrored)  - 1 x daily - 7 x weekly - 2 sets - 10 reps - 3s hold - Supine Bridge  - 1 x daily - 7 x weekly - 2 sets - 5 reps - 3s hold - Seated Hip Abduction with Resistance  - 1 x daily - 7 x weekly - 2 sets - 10 reps - 3s hold - Seated Hip Adduction Isometrics with Ball  - 1 x daily - 7 x weekly - 2  sets - 10 reps - 3s hold - Seated Long Arc Quad with Ankle Weight   - 1 x daily - 7 x weekly - 2 sets - 10 reps - 3s hold - Seated Heel Raise  - 1 x daily - 7 x weekly - 2 sets - 10 reps - 3s hold   ASSESSMENT:  CLINICAL IMPRESSION: Pt demonstrates excellent motivation during session today. Given mild knee pain today progressed to additional standing exercises. She continues to require intermittent breaks but in general her coughing is better again today. No HEP updates on this date. Plan is to continue progressive RLE strengthening at future visits as pt is able to tolerate. Patient will benefit from skilled PT to address above impairments and improve overall function.Marland Kitchen  REHAB POTENTIAL: Fair    CLINICAL DECISION MAKING: Unstable/unpredictable  EVALUATION COMPLEXITY: High   GOALS: Goals reviewed with patient? Yes  SHORT TERM GOALS:   Pt will be independent with HEP to improve strength and decrease knee pain to improve pain-free function at home. Baseline: 12/11/21: achieved  Goal status: ACHIEVED   LONG TERM GOALS: Target date: 03/13/2022   Pt will increase FOTO to at least 53 to demonstrate significant improvement in function at home and work related to knee pain  Baseline: 11/04/21: 43; 12/11/21: 47; 12/23/21: 53; 01/16/22: 43; Goal status: ONGOING  2.  Pt will decrease worst knee pain by at least 3 points on the NPRS in order to demonstrate clinically significant reduction in knee pain. Baseline: 11/04/21: worst: 4/10; 11/29: 5/10; 12/23/21: 1-2/10; 01/16/22: "0.5/10" Goal status: ACHIEVED  3.  Pt will report no further R knee buckling or falls in order to demonstrate improvement in strength and function and decrease risk for injury related to falls Baseline: 11/29: 1 episode in past week; 12/23/21: 1-2 episodes in the past week; 01/16/22: No episodes of buckling over the last week Goal status: ACHIEVED  PLAN: PT FREQUENCY: 1-2x/week  PT DURATION: 8 weeks  PLANNED INTERVENTIONS: Therapeutic exercises, Therapeutic activity, Neuromuscular  re-education, Balance training, Gait training, Patient/Family education, Joint manipulation, Joint mobilization, Canalith repositioning, Aquatic Therapy, Dry Needling, Electrical stimulation, Spinal manipulation, Spinal mobilization, Cryotherapy, Moist heat, Taping, Traction, Ultrasound, Ionotophoresis 4mg /ml Dexamethasone, and Manual therapy  PLAN FOR NEXT SESSION: Progress strengthening for RLE, review and modify HEP as necessary  Lyndel Safe Marciel Offenberger PT, DPT, GCS  Garret Teale 01/29/2022, 10:36 AM

## 2022-01-29 ENCOUNTER — Ambulatory Visit: Payer: Medicare Other

## 2022-01-29 DIAGNOSIS — M25561 Pain in right knee: Secondary | ICD-10-CM | POA: Diagnosis not present

## 2022-01-29 DIAGNOSIS — M6281 Muscle weakness (generalized): Secondary | ICD-10-CM

## 2022-01-29 DIAGNOSIS — G8929 Other chronic pain: Secondary | ICD-10-CM

## 2022-01-30 NOTE — Therapy (Signed)
OUTPATIENT PHYSICAL THERAPY KNEE TREATMENT  Patient Name: Marilyn Keith MRN: [id number] DOB:05-05-1945, 77 y.o., female Today's Date: 01/31/2022   PT End of Session - 01/31/22 1015     Visit Number 16    Number of Visits 33    Date for PT Re-Evaluation 03/13/22    Authorization Type Medicare primary; med Pay    Authorization Time Period 11/04/2021-12/30/2021    PT Start Time 1015    PT Stop Time 1100    PT Time Calculation (min) 45 min    Activity Tolerance Patient tolerated treatment well;No increased pain    Behavior During Therapy WFL for tasks assessed/performed            Past Medical History:  Diagnosis Date   Chronic back pain    GERD (gastroesophageal reflux disease)    Hypertension    Past Surgical History:  Procedure Laterality Date   BACK SURGERY     EYE SURGERY     MASTECTOMY     REPLACEMENT TOTAL KNEE BILATERAL     SHOULDER SURGERY Right    Patient Active Problem List   Diagnosis Date Noted   UTI (urinary tract infection) 08/03/2020   Chronic back pain    Hypertension    GERD (gastroesophageal reflux disease)    PCP: Danae Orleans, MD  REFERRING PROVIDER: Rubbie Battiest, PA*  REFERRING DIAGNOSIS: Pain due to total right knee replacement, initial encounter (CMS-HCC) T84.84XA  Z96.651   THERAPY DIAG: Chronic pain of right knee  Muscle weakness (generalized)  RATIONALE FOR EVALUATION AND TREATMENT: Rehabilitation  ONSET DATE: 08/04/21 (acute on chronic, approximate)  FOLLOW UP APPT WITH PROVIDER: Yes, MD recommended follow-up after at least 4 weeks of PT.   FROM INITIAL EVALUATION SUBJECTIVE:        SUBJECTIVE: Still coughing without progression. Pain remains similar. Pt works on [employer] when she has time throughout the day.                                                                                                                                                              Chief Complaint: Rt knee instability  Pertinent  History Pt reports that she fell a few months ago and states "I think I tore the other ligament in my right knee." She fell down her driveway when taking her recycling can down to the street. She underwent a right total knee arthroplasty about 10-12 years ago at an outside facility in [facility]. Patient relates that surgeon had a difficult time with her surgery as result of the severe valgus osteoarthritis in addition to poor bone quality due to treatments for breast cancer (hx of bilateral mastectomy). 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  She complains of significant instability in her right knee for at least the past 5 years but symptoms have worsened recently. She has been experiencing repeated dislocations in her R knee recently and has had 3-4 falls in the last 6 months. 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 She arrives wearing a hinged R knee brace and ambulating with a single point cane in RUE. She is also wearing a lace up R ankle brace due to recent severe foot pain for which she saw podiatry. She is using a rollator at all times at home. Pt has a history of chronic neck, low back, and bilateral shoulder pain with significant R shoulder weakness (hx of 3 RTC surgeries RUE).  Precautions: None  Patient Goals: Improve RLE stability  OBJECTIVE:   Patient Surveys  FOTO: 47 (12/11/21)    TODAY'S TREATMENT    SUBJECTIVE: Pt reports that she is doing alright today. She reports 1-2/10 bilateral knee pain upon arrival. No stumbles or falls since the last therapy session. No specific questions or concerns currently .   PAIN: 1-2/10 bilateral knee pain    Ther-ex  AA/ROM on NuStep L1-3 BLE only for strengthening x 10 minutes with therapist monitoring and adjusting difficulty as appropriate; Sit to stand  from elevated chair with Airex pad on seat and no UE support 2 x 10;  Seated marches with 7.5# ankle weights (AW) 2 x 20 BLE; Seated LAQ with 7.5# AW 2 x 20 BLE; Seated clams with blue tband 2 x 30; Seated adductor ball squeeze 2 x 30;  Standing exercises with 7.5# AW: Hip flexion marches x 20 BLE; Hip abduction x 20 BLE; Hip extension x 20 BLE; Hip abduction x 20 BLE;   Not performed: Supine RLE quad set 5s hold 2 x 10; Supine RLE SLR with 2# ankle weight (AW) 2 x 10 Supine RLE straight leg hip abduction 2 x 10, with manual resistance;  Supine RLE straight leg hip adduction 2 x 10, with manual resistance; Supine RLE heel slides with manually resisted extension 2 x 10; Hooklying bridges with arms at side 2 x 10; Hooklying adductor squeeze with manual resistance 2 x 10; Hooklying clams with manual resistance 2 x 10;   PATIENT EDUCATION:  Education details: Pt educated throughout session about proper posture and technique with exercises. Improved exercise technique, movement at target joints, use of target muscles after min to mod verbal, visual, tactile cues.  Person educated: Patient Education method: Explanation, verbal cues, tactile cues Education comprehension: verbalized understanding and returned demonstration   HOME EXERCISE PROGRAM: Access Code: [email or web address] URL: [email or web address] Date: 12/23/2021 Prepared by: Roxana Hires  Exercises - Supine Active Straight Leg Raise  - 1 x daily - 7 x weekly - 2 sets - 10 reps - 3s hold - Supine Quad Set (Mirrored)  - 1 x daily - 7 x weekly - 2 sets - 10 reps - 3s hold - Supine Bridge  - 1 x daily - 7 x weekly - 2 sets - 5 reps - 3s hold - Seated Hip Abduction with Resistance  - 1 x daily - 7 x weekly - 2 sets - 10 reps - 3s hold - Seated Hip Adduction Isometrics with Ball  - 1 x daily - 7 x weekly - 2 sets - 10 reps - 3s hold - Seated Long Arc Quad with Ankle  Weight  - 1 x daily - 7 x weekly - 2 sets - 10 reps - 3s  hold - Seated Heel Raise  - 1 x daily - 7 x weekly - 2 sets - 10 reps - 3s hold   ASSESSMENT:  CLINICAL IMPRESSION: Pt demonstrates excellent motivation during session today. Progressed ankle weights during session today. She continues to require intermittent breaks due to fatigue but is able to complete all exercises as instructed. No HEP updates on this date. Plan is to continue progressive RLE strengthening at future visits as pt is able to tolerate. Patient will benefit from skilled PT to address above impairments and improve overall function.Marland Kitchen  REHAB POTENTIAL: Fair    CLINICAL DECISION MAKING: Unstable/unpredictable  EVALUATION COMPLEXITY: High   GOALS: Goals reviewed with patient? Yes  SHORT TERM GOALS:   Pt will be independent with HEP to improve strength and decrease knee pain to improve pain-free function at home. Baseline: 12/11/21: achieved  Goal status: ACHIEVED   LONG TERM GOALS: Target date: 03/13/2022   Pt will increase FOTO to at least 53 to demonstrate significant improvement in function at home and work related to knee pain  Baseline: 11/04/21: 43; 12/11/21: 47; 12/23/21: 53; 01/16/22: 43; Goal status: ONGOING  2.  Pt will decrease worst knee pain by at least 3 points on the NPRS in order to demonstrate clinically significant reduction in knee pain. Baseline: 11/04/21: worst: 4/10; 11/29: 5/10; 12/23/21: 1-2/10; 01/16/22: "0.5/10" Goal status: ACHIEVED  3.  Pt will report no further R knee buckling or falls in order to demonstrate improvement in strength and function and decrease risk for injury related to falls Baseline: 11/29: 1 episode in past week; 12/23/21: 1-2 episodes in the past week; 01/16/22: No episodes of buckling over the last week Goal status: ACHIEVED  PLAN: PT FREQUENCY: 1-2x/week  PT DURATION: 8 weeks  PLANNED INTERVENTIONS: Therapeutic exercises, Therapeutic activity, Neuromuscular re-education, Balance training, Gait training, Patient/Family  education, Joint manipulation, Joint mobilization, Canalith repositioning, Aquatic Therapy, Dry Needling, Electrical stimulation, Spinal manipulation, Spinal mobilization, Cryotherapy, Moist heat, Taping, Traction, Ultrasound, Ionotophoresis 4mg /ml Dexamethasone, and Manual therapy  PLAN FOR NEXT SESSION: Progress strengthening for RLE, review and modify HEP as necessary  Tempie Gibeault PT, DPT, GCS  Sheikh Leverich 01/31/2022, 10:59 AM

## 2022-01-31 ENCOUNTER — Ambulatory Visit: Payer: Medicare Other

## 2022-01-31 DIAGNOSIS — M6281 Muscle weakness (generalized): Secondary | ICD-10-CM

## 2022-01-31 DIAGNOSIS — G8929 Other chronic pain: Secondary | ICD-10-CM

## 2022-01-31 DIAGNOSIS — M25561 Pain in right knee: Secondary | ICD-10-CM | POA: Diagnosis not present

## 2022-01-31 NOTE — Therapy (Signed)
OUTPATIENT PHYSICAL THERAPY KNEE TREATMENT  Patient Name: Marilyn Keith MRN: [id number] DOB:12-Jul-1945, 77 y.o., female Today's Date: 02/03/2022   PT End of Session - 02/03/22 0934     Visit Number 17    Number of Visits 33    Date for PT Re-Evaluation 03/13/22    Authorization Type Medicare primary; med Pay    Authorization Time Period 11/04/2021-12/30/2021    PT Start Time 0935    PT Stop Time 1015    PT Time Calculation (min) 40 min    Activity Tolerance Patient tolerated treatment well;No increased pain    Behavior During Therapy WFL for tasks assessed/performed            Past Medical History:  Diagnosis Date   Chronic back pain    GERD (gastroesophageal reflux disease)    Hypertension    Past Surgical History:  Procedure Laterality Date   BACK SURGERY     EYE SURGERY     MASTECTOMY     REPLACEMENT TOTAL KNEE BILATERAL     SHOULDER SURGERY Right    Patient Active Problem List   Diagnosis Date Noted   UTI (urinary tract infection) 08/03/2020   Chronic back pain    Hypertension    GERD (gastroesophageal reflux disease)    PCP: Danae Orleans, MD  REFERRING PROVIDER: Rubbie Battiest, PA*  REFERRING DIAGNOSIS: Pain due to total right knee replacement, initial encounter (CMS-HCC) T84.84XA  Z96.651   THERAPY DIAG: Chronic pain of right knee  Muscle weakness (generalized)  RATIONALE FOR EVALUATION AND TREATMENT: Rehabilitation  ONSET DATE: 08/04/21 (acute on chronic, approximate)  FOLLOW UP APPT WITH PROVIDER: Yes, MD recommended follow-up after at least 4 weeks of PT.   FROM INITIAL EVALUATION SUBJECTIVE:        SUBJECTIVE: Still coughing without progression. Pain remains similar. Pt works on [employer] when she has time throughout the day.                                                                                                                                                              Chief Complaint: Rt knee instability  Pertinent  History Pt reports that she fell a few months ago and states "I think I tore the other ligament in my right knee." She fell down her driveway when taking her recycling can down to the street. She underwent a right total knee arthroplasty about 10-12 years ago at an outside facility in [facility]. Patient relates that surgeon had a difficult time with her surgery as result of the severe valgus osteoarthritis in addition to poor bone quality due to treatments for breast cancer (hx of bilateral mastectomy). 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  She complains of significant instability in her right knee for at least the past 5 years but symptoms have worsened recently. She has been experiencing repeated dislocations in her R knee recently and has had 3-4 falls in the last 6 months. 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 She arrives wearing a hinged R knee brace and ambulating with a single point cane in RUE. She is also wearing a lace up R ankle brace due to recent severe foot pain for which she saw podiatry. She is using a rollator at all times at home. Pt has a history of chronic neck, low back, and bilateral shoulder pain with significant R shoulder weakness (hx of 3 RTC surgeries RUE).  Precautions: None  Patient Goals: Improve RLE stability  OBJECTIVE:   Patient Surveys  FOTO: 47 (12/11/21)    TODAY'S TREATMENT    SUBJECTIVE: Pt reports she is having GI upset upon arrival today. No chills, fevers, nausea, or vomiting. She believes it is related to a new medicine that she is taking. She also reports like her heart occasionally "skips a beat." 2/10 bilateral knee pain upon arrival. No stumbles or falls since the last therapy session. No specific questions currently .   PAIN: 2/10 bilateral knee pain     Ther-ex  Seated: BP: 136/83 mmHg, HR: 102 bpm, SpO2: 99% AA/ROM on NuStep L1-2 BLE only for strengthening x 8 minutes with therapist monitoring and adjusting difficulty as appropriate;  Seated marches with blue tband 2 x 30 BLE; Seated LAQ with 5# ankle weights (AW) 2 x 30 BLE; Seated clams with blue tband 2 x 30; Seated adductor ball squeeze 2 x 30; Seated LAQ with 5# AW 2 x 30; Seated heel raises 2 x 30;   Not performed: Supine RLE quad set 5s hold 2 x 10; Supine RLE SLR with 2# ankle weight (AW) 2 x 10 Supine RLE straight leg hip abduction 2 x 10, with manual resistance;  Supine RLE straight leg hip adduction 2 x 10, with manual resistance; Supine RLE heel slides with manually resisted extension 2 x 10; Hooklying bridges with arms at side 2 x 10; Hooklying adductor squeeze with manual resistance 2 x 10; Hooklying clams with manual resistance 2 x 10; Sit to stand from elevated chair with Airex pad on seat and no UE support 2 x 10; Standing exercises with 7.5# AW: Hip flexion marches x 20 BLE; Hip abduction x 20 BLE; Hip extension x 20 BLE; Hip abduction x 20 BLE;   PATIENT EDUCATION:  Education details: Pt educated throughout session about proper posture and technique with exercises. Improved exercise technique, movement at target joints, use of target muscles after min to mod verbal, visual, tactile cues.  Person educated: Patient Education method: Explanation, verbal cues, tactile cues Education comprehension: verbalized understanding and returned demonstration   HOME EXERCISE PROGRAM: Access Code: [email or web address] URL: [email or web address] Date: 12/23/2021 Prepared by: Roxana Hires  Exercises - Supine Active Straight Leg Raise  - 1 x daily - 7 x weekly - 2 sets - 10 reps - 3s hold - Supine Quad Set (Mirrored)  - 1 x daily - 7 x weekly - 2 sets - 10 reps - 3s hold - Supine Bridge  - 1 x daily - 7 x weekly - 2 sets - 5 reps - 3s hold - Seated Hip Abduction  with Resistance  -  1 x daily - 7 x weekly - 2 sets - 10 reps - 3s hold - Seated Hip Adduction Isometrics with Ball  - 1 x daily - 7 x weekly - 2 sets - 10 reps - 3s hold - Seated Long Arc Quad with Ankle Weight  - 1 x daily - 7 x weekly - 2 sets - 10 reps - 3s hold - Seated Heel Raise  - 1 x daily - 7 x weekly - 2 sets - 10 reps - 3s hold   ASSESSMENT:  CLINICAL IMPRESSION: Pt demonstrates excellent motivation during session today but is notably more fatigued. Session is abbreviated slightly due to an extended restroom break. Regressed exercises slightly today due to fatigue. Pt encouraged to contact MD regarding feelings of her heart "skipping a beat." Upon palpation pulse is regularly irregular with 15-20s of normal beats followed by one pause. No HEP updates on this date. Plan is to continue progressive RLE strengthening at future visits as pt is able to tolerate. Patient will benefit from skilled PT to address above impairments and improve overall function.Marland Kitchen  REHAB POTENTIAL: Fair    CLINICAL DECISION MAKING: Unstable/unpredictable  EVALUATION COMPLEXITY: High   GOALS: Goals reviewed with patient? Yes  SHORT TERM GOALS:   Pt will be independent with HEP to improve strength and decrease knee pain to improve pain-free function at home. Baseline: 12/11/21: achieved  Goal status: ACHIEVED   LONG TERM GOALS: Target date: 03/13/2022   Pt will increase FOTO to at least 53 to demonstrate significant improvement in function at home and work related to knee pain  Baseline: 11/04/21: 43; 12/11/21: 47; 12/23/21: 53; 01/16/22: 43; Goal status: ONGOING  2.  Pt will decrease worst knee pain by at least 3 points on the NPRS in order to demonstrate clinically significant reduction in knee pain. Baseline: 11/04/21: worst: 4/10; 11/29: 5/10; 12/23/21: 1-2/10; 01/16/22: "0.5/10" Goal status: ACHIEVED  3.  Pt will report no further R knee buckling or falls in order to demonstrate improvement in  strength and function and decrease risk for injury related to falls Baseline: 11/29: 1 episode in past week; 12/23/21: 1-2 episodes in the past week; 01/16/22: No episodes of buckling over the last week Goal status: ACHIEVED  PLAN: PT FREQUENCY: 1-2x/week  PT DURATION: 8 weeks  PLANNED INTERVENTIONS: Therapeutic exercises, Therapeutic activity, Neuromuscular re-education, Balance training, Gait training, Patient/Family education, Joint manipulation, Joint mobilization, Canalith repositioning, Aquatic Therapy, Dry Needling, Electrical stimulation, Spinal manipulation, Spinal mobilization, Cryotherapy, Moist heat, Taping, Traction, Ultrasound, Ionotophoresis 4mg /ml Dexamethasone, and Manual therapy  PLAN FOR NEXT SESSION: Progress strengthening for RLE, review and modify HEP as necessary  Sherita Decoste PT, DPT, GCS  Marilyn Keith 02/03/2022, 11:34 AM

## 2022-02-03 ENCOUNTER — Ambulatory Visit: Payer: Medicare Other

## 2022-02-03 DIAGNOSIS — M6281 Muscle weakness (generalized): Secondary | ICD-10-CM

## 2022-02-03 DIAGNOSIS — G8929 Other chronic pain: Secondary | ICD-10-CM

## 2022-02-03 DIAGNOSIS — M25561 Pain in right knee: Secondary | ICD-10-CM | POA: Diagnosis not present

## 2022-02-05 ENCOUNTER — Ambulatory Visit: Payer: Medicare Other

## 2022-02-05 DIAGNOSIS — M6281 Muscle weakness (generalized): Secondary | ICD-10-CM

## 2022-02-05 DIAGNOSIS — G8929 Other chronic pain: Secondary | ICD-10-CM

## 2022-02-05 DIAGNOSIS — M25561 Pain in right knee: Secondary | ICD-10-CM | POA: Diagnosis not present

## 2022-02-05 NOTE — Therapy (Signed)
OUTPATIENT PHYSICAL THERAPY KNEE TREATMENT  Patient Name: Marilyn Keith MRN: [id number] DOB:[birth year], 77 y.o., female Today's Date: 02/05/2022   PT End of Session - 02/05/22 0916     Visit Number 18    Number of Visits 33    Date for PT Re-Evaluation 03/13/22    Authorization Type Medicare primary; med Pay    Authorization Time Period 11/04/2021-12/30/2021    PT Start Time 0920    PT Stop Time 1005    PT Time Calculation (min) 45 min    Activity Tolerance Patient tolerated treatment well;No increased pain    Behavior During Therapy WFL for tasks assessed/performed            Past Medical History:  Diagnosis Date   Chronic back pain    GERD (gastroesophageal reflux disease)    Hypertension    Past Surgical History:  Procedure Laterality Date   BACK SURGERY     EYE SURGERY     MASTECTOMY     REPLACEMENT TOTAL KNEE BILATERAL     SHOULDER SURGERY Right    Patient Active Problem List   Diagnosis Date Noted   UTI (urinary tract infection) 08/03/2020   Chronic back pain    Hypertension    GERD (gastroesophageal reflux disease)    PCP: Danae Orleans, MD  REFERRING PROVIDER: Rubbie Battiest, PA*  REFERRING DIAGNOSIS: Pain due to total right knee replacement, initial encounter (CMS-HCC) T84.84XA  Z96.651   THERAPY DIAG: Chronic pain of right knee  Muscle weakness (generalized)  RATIONALE FOR EVALUATION AND TREATMENT: Rehabilitation  ONSET DATE: 08/04/21 (acute on chronic, approximate)  FOLLOW UP APPT WITH PROVIDER: Yes, MD recommended follow-up after at least 4 weeks of PT.   FROM INITIAL EVALUATION SUBJECTIVE:        SUBJECTIVE: Still coughing without progression. Pain remains similar. Pt works on [employer] when she has time throughout the day.                                                                                                                                                              Chief Complaint: Rt knee instability  Pertinent  History Pt reports that she fell a few months ago and states "I think I tore the other ligament in my right knee." She fell down her driveway when taking her recycling can down to the street. She underwent a right total knee arthroplasty about 10-12 years ago at an outside facility in [facility]. Patient relates that surgeon had a difficult time with her surgery as result of the severe valgus osteoarthritis in addition to poor bone quality due to treatments for breast cancer (hx of bilateral mastectomy). 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  She complains of significant instability in her right knee for at least the past 5 years but symptoms have worsened recently. She has been experiencing repeated dislocations in her R knee recently and has had 3-4 falls in the last 6 months. 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 She arrives wearing a hinged R knee brace and ambulating with a single point cane in RUE. She is also wearing a lace up R ankle brace due to recent severe foot pain for which she saw podiatry. She is using a rollator at all times at home. Pt has a history of chronic neck, low back, and bilateral shoulder pain with significant R shoulder weakness (hx of 3 RTC surgeries RUE).  Precautions: None  Patient Goals: Improve RLE stability  OBJECTIVE:   Patient Surveys  FOTO: 47 (12/11/21)    TODAY'S TREATMENT    SUBJECTIVE: Pt reports she is feeling much better than she did on Monday. No stumbles or falls since the last therapy session. No specific questions currently .   PAIN: Chronic bilateral knee pain    Ther-ex  AA/ROM on NuStep L1-3 BLE only for strengthening x 10 minutes with therapist monitoring and adjusting difficulty as appropriate;  Mini squats x 15; Resisted side stepping with blue tband  around knees x 6 lengths in // bars; Seated LAQ with 7.5# ankle weights (AW) x 35 BLE; Seated clams with blue tband x 35; Seated adductor ball squeeze x 35; Seated LAQ with 5# AW x 35; Standing heel raises x 35; Sit to stand from elevated chair with Airex pad on seat and no UE support x 10;  Standing exercises with 7.5# AW: Hip flexion marches x 35 BLE; Hip abduction x 35 BLE; Hip extension x 35 BLE; Hip abduction x 35 BLE;   Not performed: Supine RLE quad set 5s hold 2 x 10; Supine RLE SLR with 2# ankle weight (AW) 2 x 10 Supine RLE straight leg hip abduction 2 x 10, with manual resistance;  Supine RLE straight leg hip adduction 2 x 10, with manual resistance; Supine RLE heel slides with manually resisted extension 2 x 10; Hooklying bridges with arms at side 2 x 10; Hooklying adductor squeeze with manual resistance 2 x 10; Hooklying clams with manual resistance 2 x 10;    PATIENT EDUCATION:  Education details: Pt educated throughout session about proper posture and technique with exercises. Improved exercise technique, movement at target joints, use of target muscles after min to mod verbal, visual, tactile cues.  Person educated: Patient Education method: Explanation, verbal cues, tactile cues Education comprehension: verbalized understanding and returned demonstration   HOME EXERCISE PROGRAM: Access Code: [email or web address] URL: [email or web address] Date: 12/23/2021 Prepared by: Roxana Hires  Exercises - Supine Active Straight Leg Raise  - 1 x daily - 7 x weekly - 2 sets - 10 reps - 3s hold - Supine Quad Set (Mirrored)  - 1 x daily - 7 x weekly - 2 sets - 10 reps - 3s hold - Supine Bridge  - 1 x daily - 7 x weekly - 2 sets - 5 reps - 3s hold - Seated Hip Abduction with Resistance  - 1 x daily - 7 x weekly - 2 sets - 10 reps - 3s hold - Seated Hip Adduction Isometrics with Ball  - 1 x daily - 7 x weekly - 2 sets -  10 reps - 3s hold - Seated Long Arc Quad with  Ankle Weight  - 1 x daily - 7 x weekly - 2 sets - 10 reps - 3s hold - Seated Heel Raise  - 1 x daily - 7 x weekly - 2 sets - 10 reps - 3s hold   ASSESSMENT:  CLINICAL IMPRESSION: Pt demonstrates excellent motivation during session today and is feeling considerably better during session. Progressed exercises again today and returned to 7.5# ankle weights. Also returned to standing exercises. Pt continues to require intermittent seated rest breaks. No HEP updates on this date. Plan is to continue progressive RLE strengthening at future visits as pt is able to tolerate. Patient will benefit from skilled PT to address above impairments and improve overall function.Marland Kitchen  REHAB POTENTIAL: Fair    CLINICAL DECISION MAKING: Unstable/unpredictable  EVALUATION COMPLEXITY: High   GOALS: Goals reviewed with patient? Yes  SHORT TERM GOALS:   Pt will be independent with HEP to improve strength and decrease knee pain to improve pain-free function at home. Baseline: 12/11/21: achieved  Goal status: ACHIEVED   LONG TERM GOALS: Target date: 03/13/2022   Pt will increase FOTO to at least 53 to demonstrate significant improvement in function at home and work related to knee pain  Baseline: 11/04/21: 43; 12/11/21: 47; 12/23/21: 53; 01/16/22: 43; Goal status: ONGOING  2.  Pt will decrease worst knee pain by at least 3 points on the NPRS in order to demonstrate clinically significant reduction in knee pain. Baseline: 11/04/21: worst: 4/10; 11/29: 5/10; 12/23/21: 1-2/10; 01/16/22: "0.5/10" Goal status: ACHIEVED  3.  Pt will report no further R knee buckling or falls in order to demonstrate improvement in strength and function and decrease risk for injury related to falls Baseline: 11/29: 1 episode in past week; 12/23/21: 1-2 episodes in the past week; 01/16/22: No episodes of buckling over the last week Goal status: ACHIEVED  PLAN: PT FREQUENCY: 1-2x/week  PT DURATION: 8 weeks  PLANNED INTERVENTIONS:  Therapeutic exercises, Therapeutic activity, Neuromuscular re-education, Balance training, Gait training, Patient/Family education, Joint manipulation, Joint mobilization, Canalith repositioning, Aquatic Therapy, Dry Needling, Electrical stimulation, Spinal manipulation, Spinal mobilization, Cryotherapy, Moist heat, Taping, Traction, Ultrasound, Ionotophoresis 4mg /ml Dexamethasone, and Manual therapy  PLAN FOR NEXT SESSION: Progress strengthening for RLE, review and modify HEP as necessary  Brynn Reznik D Kween Bacorn PT, DPT, GCS  Trysta Showman 02/05/2022, 10:12 AM

## 2022-02-10 NOTE — Therapy (Incomplete)
OUTPATIENT PHYSICAL THERAPY KNEE TREATMENT  Patient Name: Marilyn Keith MRN: [id number] DOB:1945-08-06, 77 y.o., female Today's Date: 02/10/2022    Past Medical History:  Diagnosis Date   Chronic back pain    GERD (gastroesophageal reflux disease)    Hypertension    Past Surgical History:  Procedure Laterality Date   BACK SURGERY     EYE SURGERY     MASTECTOMY     REPLACEMENT TOTAL KNEE BILATERAL     SHOULDER SURGERY Right    Patient Active Problem List   Diagnosis Date Noted   UTI (urinary tract infection) 08/03/2020   Chronic back pain    Hypertension    GERD (gastroesophageal reflux disease)    PCP: Danae Orleans, MD  REFERRING PROVIDER: Danae Orleans, MD  REFERRING DIAGNOSIS: Pain due to total right knee replacement, initial encounter (CMS-HCC) T84.84XA  Z96.651   THERAPY DIAG: Chronic pain of right knee  Muscle weakness (generalized)  RATIONALE FOR EVALUATION AND TREATMENT: Rehabilitation  ONSET DATE: 08/04/21 (acute on chronic, approximate)  FOLLOW UP APPT WITH PROVIDER: Yes, MD recommended follow-up after at least 4 weeks of PT.   FROM INITIAL EVALUATION SUBJECTIVE:        SUBJECTIVE: Still coughing without progression. Pain remains similar. Pt works on [employer] when she has time throughout the day.                                                                                                                                                              Chief Complaint: Rt knee instability  Pertinent History Pt reports that she fell a few months ago and states "I think I tore the other ligament in my right knee." She fell down her driveway when taking her recycling can down to the street. She underwent a right total knee arthroplasty about 10-12 years ago at an outside facility in [facility]. Patient relates that surgeon had a difficult time with her surgery as result of the severe valgus osteoarthritis in addition to poor bone quality due to  treatments for breast cancer (hx of bilateral mastectomy). 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  She complains of significant instability in her right knee for at least the past 5 years but symptoms have worsened recently. She has been experiencing repeated dislocations in her R knee recently and has had 3-4 falls in the last 6 months. 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 She arrives wearing a hinged R knee brace and ambulating with a single point cane in RUE. She is also wearing a lace up R ankle brace due  to recent severe foot pain for which she saw podiatry. She is using a rollator at all times at home. Pt has a history of chronic neck, low back, and bilateral shoulder pain with significant R shoulder weakness (hx of 3 RTC surgeries RUE).  Precautions: None  Patient Goals: Improve RLE stability  OBJECTIVE:   Patient Surveys  FOTO: 47 (12/11/21)    TODAY'S TREATMENT    SUBJECTIVE: Pt reports she is feeling much better than she did on Monday. No stumbles or falls since the last therapy session. No specific questions currently .   PAIN: Chronic bilateral knee pain    Ther-ex  AA/ROM on NuStep L1-3 BLE only for strengthening x 10 minutes with therapist monitoring and adjusting difficulty as appropriate;  Mini squats x 15; Resisted side stepping with blue tband around knees x 6 lengths in // bars; Seated LAQ with 7.5# ankle weights (AW) x 35 BLE; Seated clams with blue tband x 35; Seated adductor ball squeeze x 35; Seated LAQ with 5# AW x 35; Standing heel raises x 35; Sit to stand from elevated chair with Airex pad on seat and no UE support x 10;  Standing exercises with 7.5# AW: Hip flexion marches x 35 BLE; Hip abduction x 35 BLE; Hip extension x 35 BLE; Hip abduction x 35 BLE;   Not  performed: Supine RLE quad set 5s hold 2 x 10; Supine RLE SLR with 2# ankle weight (AW) 2 x 10 Supine RLE straight leg hip abduction 2 x 10, with manual resistance;  Supine RLE straight leg hip adduction 2 x 10, with manual resistance; Supine RLE heel slides with manually resisted extension 2 x 10; Hooklying bridges with arms at side 2 x 10; Hooklying adductor squeeze with manual resistance 2 x 10; Hooklying clams with manual resistance 2 x 10;    PATIENT EDUCATION:  Education details: Pt educated throughout session about proper posture and technique with exercises. Improved exercise technique, movement at target joints, use of target muscles after min to mod verbal, visual, tactile cues.  Person educated: Patient Education method: Explanation, verbal cues, tactile cues Education comprehension: verbalized understanding and returned demonstration   HOME EXERCISE PROGRAM: Access Code: [email or web address] URL: [email or web address] Date: 12/23/2021 Prepared by: Ria Comment  Exercises - Supine Active Straight Leg Raise  - 1 x daily - 7 x weekly - 2 sets - 10 reps - 3s hold - Supine Quad Set (Mirrored)  - 1 x daily - 7 x weekly - 2 sets - 10 reps - 3s hold - Supine Bridge  - 1 x daily - 7 x weekly - 2 sets - 5 reps - 3s hold - Seated Hip Abduction with Resistance  - 1 x daily - 7 x weekly - 2 sets - 10 reps - 3s hold - Seated Hip Adduction Isometrics with Ball  - 1 x daily - 7 x weekly - 2 sets - 10 reps - 3s hold - Seated Long Arc Quad with Ankle Weight  - 1 x daily - 7 x weekly - 2 sets - 10 reps - 3s hold - Seated Heel Raise  - 1 x daily - 7 x weekly - 2 sets - 10 reps - 3s hold   ASSESSMENT:  CLINICAL IMPRESSION: Pt demonstrates excellent motivation during session today and is feeling considerably better during session. Progressed exercises again today and returned to 7.5# ankle weights. Also returned to standing exercises. Pt continues to require intermittent seated rest  breaks. No HEP updates  on this date. Plan is to continue progressive RLE strengthening at future visits as pt is able to tolerate. Patient will benefit from skilled PT to address above impairments and improve overall function.Marland Kitchen  REHAB POTENTIAL: Fair    CLINICAL DECISION MAKING: Unstable/unpredictable  EVALUATION COMPLEXITY: High   GOALS: Goals reviewed with patient? Yes  SHORT TERM GOALS:   Pt will be independent with HEP to improve strength and decrease knee pain to improve pain-free function at home. Baseline: 12/11/21: achieved  Goal status: ACHIEVED   LONG TERM GOALS: Target date: 03/13/2022   Pt will increase FOTO to at least 53 to demonstrate significant improvement in function at home and work related to knee pain  Baseline: 11/04/21: 43; 12/11/21: 47; 12/23/21: 53; 01/16/22: 43; Goal status: ONGOING  2.  Pt will decrease worst knee pain by at least 3 points on the NPRS in order to demonstrate clinically significant reduction in knee pain. Baseline: 11/04/21: worst: 4/10; 11/29: 5/10; 12/23/21: 1-2/10; 01/16/22: "0.5/10" Goal status: ACHIEVED  3.  Pt will report no further R knee buckling or falls in order to demonstrate improvement in strength and function and decrease risk for injury related to falls Baseline: 11/29: 1 episode in past week; 12/23/21: 1-2 episodes in the past week; 01/16/22: No episodes of buckling over the last week Goal status: ACHIEVED  PLAN: PT FREQUENCY: 1-2x/week  PT DURATION: 8 weeks  PLANNED INTERVENTIONS: Therapeutic exercises, Therapeutic activity, Neuromuscular re-education, Balance training, Gait training, Patient/Family education, Joint manipulation, Joint mobilization, Canalith repositioning, Aquatic Therapy, Dry Needling, Electrical stimulation, Spinal manipulation, Spinal mobilization, Cryotherapy, Moist heat, Taping, Traction, Ultrasound, Ionotophoresis 4mg /ml Dexamethasone, and Manual therapy  PLAN FOR NEXT SESSION: Progress strengthening  for RLE, review and modify HEP as necessary  Lyndel Safe Jarmaine Ehrler PT, DPT, GCS  Marilyn Keith 02/10/2022, 8:59 AM

## 2022-02-12 ENCOUNTER — Ambulatory Visit: Payer: Medicare Other

## 2022-02-12 DIAGNOSIS — M6281 Muscle weakness (generalized): Secondary | ICD-10-CM

## 2022-02-12 DIAGNOSIS — G8929 Other chronic pain: Secondary | ICD-10-CM

## 2022-02-14 NOTE — Therapy (Signed)
OUTPATIENT PHYSICAL THERAPY KNEE TREATMENT  Patient Name: Marilyn Keith MRN: [id number] DOB:November 01, 1945, 77 y.o., female Today's Date: 02/17/2022   PT End of Session - 02/17/22 0935     Visit Number 19    Number of Visits 33    Date for PT Re-Evaluation 03/13/22    Authorization Type Medicare primary; med Pay    Authorization Time Period --    PT Start Time 0930    PT Stop Time 1015    PT Time Calculation (min) 45 min    Activity Tolerance Patient tolerated treatment well;No increased pain    Behavior During Therapy WFL for tasks assessed/performed            Past Medical History:  Diagnosis Date   Chronic back pain    GERD (gastroesophageal reflux disease)    Hypertension    Past Surgical History:  Procedure Laterality Date   BACK SURGERY     EYE SURGERY     MASTECTOMY     REPLACEMENT TOTAL KNEE BILATERAL     SHOULDER SURGERY Right    Patient Active Problem List   Diagnosis Date Noted   UTI (urinary tract infection) 08/03/2020   Chronic back pain    Hypertension    GERD (gastroesophageal reflux disease)    PCP: Danae Orleans, MD  REFERRING PROVIDER: Rubbie Battiest, PA*  REFERRING DIAGNOSIS: Pain due to total right knee replacement, initial encounter (CMS-HCC) T84.84XA  Z96.651   THERAPY DIAG: Chronic pain of right knee  Muscle weakness (generalized)  RATIONALE FOR EVALUATION AND TREATMENT: Rehabilitation  ONSET DATE: 08/04/21 (acute on chronic, approximate)  FOLLOW UP APPT WITH PROVIDER: Yes, MD recommended follow-up after at least 4 weeks of PT.   FROM INITIAL EVALUATION SUBJECTIVE:        SUBJECTIVE: Still coughing without progression. Pain remains similar. Pt works on [employer] when she has time throughout the day.                                                                                                                                                              Chief Complaint: Rt knee instability  Pertinent History Pt reports that she  fell a few months ago and states "I think I tore the other ligament in my right knee." She fell down her driveway when taking her recycling can down to the street. She underwent a right total knee arthroplasty about 10-12 years ago at an outside facility in [facility]. Patient relates that surgeon had a difficult time with her surgery as result of the severe valgus osteoarthritis in addition to poor bone quality due to treatments for breast cancer (hx of bilateral mastectomy). 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  She complains of significant instability in her right knee for at least the past 5 years but symptoms have worsened recently. She has been experiencing repeated dislocations in her R knee recently and has had 3-4 falls in the last 6 months. 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 She arrives wearing a hinged R knee brace and ambulating with a single point cane in RUE. She is also wearing a lace up R ankle brace due to recent severe foot pain for which she saw podiatry. She is using a rollator at all times at home. Pt has a history of chronic neck, low back, and bilateral shoulder pain with significant R shoulder weakness (hx of 3 RTC surgeries RUE).  Precautions: None  Patient Goals: Improve RLE stability  OBJECTIVE:   Patient Surveys  FOTO: 47 (12/11/21)    TODAY'S TREATMENT    SUBJECTIVE: Pt reports she is feeling well today. No stumbles or falls since the last therapy session. Overall she has more confidence in her RLE. She returns to see the orthopedic PA around 2/20. No specific questions currently .   PAIN: Chronic bilateral knee pain    Ther-ex  AA/ROM on NuStep L1-3 BLE only for strengthening x 10 minutes with therapist monitoring and adjusting difficulty as appropriate;  Sit to stand  from elevated chair with Airex pad on seat and no UE support x 10; Resisted side stepping with blue tband around knees x 6 lengths in // bars; Seated LAQ with 7.5# ankle weights (AW) 2 x 35 BLE; Seated clams with blue tband x 35; Seated adductor ball squeeze x 35;  Standing exercises with 7.5# AW: Hip flexion marches x 35 BLE; Hip abduction x 35 BLE; Hip extension x 35 BLE; Hip abduction x 35 BLE;   Not performed: Supine RLE quad set 5s hold 2 x 10; Supine RLE SLR with 2# ankle weight (AW) 2 x 10 Supine RLE straight leg hip abduction 2 x 10, with manual resistance;  Supine RLE straight leg hip adduction 2 x 10, with manual resistance; Supine RLE heel slides with manually resisted extension 2 x 10; Hooklying bridges with arms at side 2 x 10; Hooklying adductor squeeze with manual resistance 2 x 10; Hooklying clams with manual resistance 2 x 10;    PATIENT EDUCATION:  Education details: Pt educated throughout session about proper posture and technique with exercises. Improved exercise technique, movement at target joints, use of target muscles after min to mod verbal, visual, tactile cues.  Person educated: Patient Education method: Explanation, verbal cues, tactile cues Education comprehension: verbalized understanding and returned demonstration   HOME EXERCISE PROGRAM: Access Code: [email or web address] URL: [email or web address] Date: 12/23/2021 Prepared by: Roxana Hires  Exercises - Supine Active Straight Leg Raise  - 1 x daily - 7 x weekly - 2 sets - 10 reps - 3s hold - Supine Quad Set (Mirrored)  - 1 x daily - 7 x weekly - 2 sets - 10 reps - 3s hold - Supine Bridge  - 1 x daily - 7 x weekly - 2 sets - 5 reps - 3s hold - Seated Hip Abduction with Resistance  - 1 x daily - 7 x weekly - 2 sets - 10 reps - 3s hold - Seated Hip Adduction Isometrics with Ball  - 1 x daily - 7 x weekly - 2 sets - 10 reps -  3s hold - Seated Long Arc Quad with Ankle Weight  - 1 x daily - 7 x  weekly - 2 sets - 10 reps - 3s hold - Seated Heel Raise  - 1 x daily - 7 x weekly - 2 sets - 10 reps - 3s hold   ASSESSMENT:  CLINICAL IMPRESSION: Pt demonstrates excellent motivation during session today.. Continued exercises today with 7.5# ankle weights. Extensive conversation with patient regarding the pros/cons of a knee fusion. Pt continues to require intermittent seated rest breaks however this has improved over the last couple sessions. No HEP updates on this date. Plan is to continue progressive RLE strengthening at future visits as pt is able to tolerate. Patient will benefit from skilled PT to address above impairments and improve overall function.Marland Kitchen  REHAB POTENTIAL: Fair    CLINICAL DECISION MAKING: Unstable/unpredictable  EVALUATION COMPLEXITY: High   GOALS: Goals reviewed with patient? Yes  SHORT TERM GOALS:   Pt will be independent with HEP to improve strength and decrease knee pain to improve pain-free function at home. Baseline: 12/11/21: achieved  Goal status: ACHIEVED   LONG TERM GOALS: Target date: 03/13/2022   Pt will increase FOTO to at least 53 to demonstrate significant improvement in function at home and work related to knee pain  Baseline: 11/04/21: 43; 12/11/21: 47; 12/23/21: 53; 01/16/22: 43; Goal status: ONGOING  2.  Pt will decrease worst knee pain by at least 3 points on the NPRS in order to demonstrate clinically significant reduction in knee pain. Baseline: 11/04/21: worst: 4/10; 11/29: 5/10; 12/23/21: 1-2/10; 01/16/22: "0.5/10" Goal status: ACHIEVED  3.  Pt will report no further R knee buckling or falls in order to demonstrate improvement in strength and function and decrease risk for injury related to falls Baseline: 11/29: 1 episode in past week; 12/23/21: 1-2 episodes in the past week; 01/16/22: No episodes of buckling over the last week Goal status: ACHIEVED  PLAN: PT FREQUENCY: 1-2x/week  PT DURATION: 8 weeks  PLANNED INTERVENTIONS:  Therapeutic exercises, Therapeutic activity, Neuromuscular re-education, Balance training, Gait training, Patient/Family education, Joint manipulation, Joint mobilization, Canalith repositioning, Aquatic Therapy, Dry Needling, Electrical stimulation, Spinal manipulation, Spinal mobilization, Cryotherapy, Moist heat, Taping, Traction, Ultrasound, Ionotophoresis 4mg /ml Dexamethasone, and Manual therapy  PLAN FOR NEXT SESSION: Update outcome measures/goals/progress note, progress strengthening for RLE, review and modify HEP as necessary  Lyndel Safe [place] Culbreth PT, DPT, GCS  Alan Drummer 02/17/2022, 1:09 PM

## 2022-02-17 ENCOUNTER — Ambulatory Visit: Payer: Medicare Other | Attending: Physician Assistant

## 2022-02-17 DIAGNOSIS — M25561 Pain in right knee: Secondary | ICD-10-CM | POA: Insufficient documentation

## 2022-02-17 DIAGNOSIS — M6281 Muscle weakness (generalized): Secondary | ICD-10-CM | POA: Diagnosis present

## 2022-02-17 DIAGNOSIS — G8929 Other chronic pain: Secondary | ICD-10-CM | POA: Diagnosis present

## 2022-02-19 ENCOUNTER — Ambulatory Visit: Payer: Medicare Other

## 2022-02-19 DIAGNOSIS — M6281 Muscle weakness (generalized): Secondary | ICD-10-CM

## 2022-02-19 DIAGNOSIS — M25561 Pain in right knee: Secondary | ICD-10-CM | POA: Diagnosis not present

## 2022-02-19 DIAGNOSIS — G8929 Other chronic pain: Secondary | ICD-10-CM

## 2022-02-19 NOTE — Therapy (Signed)
OUTPATIENT PHYSICAL THERAPY KNEE TREATMENT/PROGRESS NOTE  Dates of reporting period  12/23/21   to   02/19/22   Patient Name: Marilyn Keith MRN: [id number] DOB:Mar 21, 1945, 77 y.o., female Today's Date: 02/19/2022   PT End of Session - 02/19/22 0927     Visit Number 20    Number of Visits 33    Date for PT Re-Evaluation 03/13/22    Authorization Type Medicare primary; med Pay    PT Start Time [phone]    PT Stop Time 1010    PT Time Calculation (min) 45 min    Activity Tolerance Patient tolerated treatment well;No increased pain    Behavior During Therapy WFL for tasks assessed/performed            Past Medical History:  Diagnosis Date   Chronic back pain    GERD (gastroesophageal reflux disease)    Hypertension    Past Surgical History:  Procedure Laterality Date   BACK SURGERY     EYE SURGERY     MASTECTOMY     REPLACEMENT TOTAL KNEE BILATERAL     SHOULDER SURGERY Right    Patient Active Problem List   Diagnosis Date Noted   UTI (urinary tract infection) 08/03/2020   Chronic back pain    Hypertension    GERD (gastroesophageal reflux disease)    PCP: Danae Orleans, MD  REFERRING PROVIDER: Rubbie Battiest, PA*  REFERRING DIAGNOSIS: Pain due to total right knee replacement, initial encounter (CMS-HCC) T84.84XA  Z96.651   THERAPY DIAG: Chronic pain of right knee  Muscle weakness (generalized)  RATIONALE FOR EVALUATION AND TREATMENT: Rehabilitation  ONSET DATE: 08/04/21 (acute on chronic, approximate)  FOLLOW UP APPT WITH PROVIDER: Yes, MD recommended follow-up after at least 4 weeks of PT.   FROM INITIAL EVALUATION SUBJECTIVE:        SUBJECTIVE: Still coughing without progression. Pain remains similar. Pt works on [employer] when she has time throughout the day.                                                                                                                                                              Chief Complaint: Rt knee  instability  Pertinent History Pt reports that she fell a few months ago and states "I think I tore the other ligament in my right knee." She fell down her driveway when taking her recycling can down to the street. She underwent a right total knee arthroplasty about 10-12 years ago at an outside facility in [facility]. Patient relates that surgeon had a difficult time with her surgery as result of the severe valgus osteoarthritis in addition to poor bone quality due to treatments for breast cancer (hx of bilateral mastectomy). 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  She complains of significant instability in her right knee for at least the past 5 years but symptoms have worsened recently. She has been experiencing repeated dislocations in her R knee recently and has had 3-4 falls in the last 6 months. 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 She arrives wearing a hinged R knee brace and ambulating with a single point cane in RUE. She is also wearing a lace up R ankle brace due to recent severe foot pain for which she saw podiatry. She is using a rollator at all times at home. Pt has a history of chronic neck, low back, and bilateral shoulder pain with significant R shoulder weakness (hx of 3 RTC surgeries RUE).  Precautions: None  Patient Goals: Improve RLE stability  OBJECTIVE:   Patient Surveys  FOTO: 47 (12/11/21)    TODAY'S TREATMENT    SUBJECTIVE: Pt reports she is feeling well today. No stumbles or falls since the last therapy session. She has had one buckle in her RLE over the last week. She returns to see the orthopedic PA on 2/12 and has an appointment that morning with therapy. No specific questions currently .   PAIN: Chronic bilateral knee ache, R foot pain, not asked to rate;    Ther-ex  AA/ROM  on NuStep L1-2 BLE only for strengthening x 10 minutes with therapist monitoring and adjusting difficulty as appropriate;  Forward 6" step-ups with BUE support and leading with RLE x 10; R lateral 6" step-ups with BUE support x 10;  Seated LAQ with 7.5# ankle weights (AW) 2 x 35 BLE; Standing heel raises x 20;  Standing exercises with 7.5# AW: Hip flexion marches x 35 BLE; Hip abduction x 35 BLE; Hip extension x 35 BLE; Hip abduction x 35 BLE;   Not performed: Supine RLE quad set 5s hold 2 x 10; Supine RLE SLR with 2# ankle weight (AW) 2 x 10 Supine RLE straight leg hip abduction 2 x 10, with manual resistance;  Supine RLE straight leg hip adduction 2 x 10, with manual resistance; Supine RLE heel slides with manually resisted extension 2 x 10; Hooklying bridges with arms at side 2 x 10; Hooklying adductor squeeze with manual resistance 2 x 10; Hooklying clams with manual resistance 2 x 10; Sit to stand from elevated chair with Airex pad on seat and no UE support x 10; Resisted side stepping with blue tband around knees x 6 lengths in // bars;   PATIENT EDUCATION:  Education details: Pt educated throughout session about proper posture and technique with exercises. Improved exercise technique, movement at target joints, use of target muscles after min to mod verbal, visual, tactile cues.  Person educated: Patient Education method: Explanation, verbal cues, tactile cues Education comprehension: verbalized understanding and returned demonstration   HOME EXERCISE PROGRAM: Access Code: [email or web address] URL: [email or web address] Date: 12/23/2021 Prepared by: Roxana Hires  Exercises - Supine Active Straight Leg Raise  - 1 x daily - 7 x weekly - 2 sets - 10 reps - 3s hold - Supine Quad Set (Mirrored)  - 1 x daily - 7 x weekly - 2 sets - 10 reps - 3s hold - Supine Bridge  - 1 x daily - 7 x weekly - 2 sets - 5 reps - 3s hold - Seated Hip Abduction with Resistance  -  1 x  daily - 7 x weekly - 2 sets - 10 reps - 3s hold - Seated Hip Adduction Isometrics with Ball  - 1 x daily - 7 x weekly - 2 sets - 10 reps - 3s hold - Seated Long Arc Quad with Ankle Weight  - 1 x daily - 7 x weekly - 2 sets - 10 reps - 3s hold - Seated Heel Raise  - 1 x daily - 7 x weekly - 2 sets - 10 reps - 3s hold   ASSESSMENT:  CLINICAL IMPRESSION: Updated outcome measures with patient during visit today. Overall she reports improvement in RLE stability and confidence since starting with therapy. She continues to have infrequent buckling of RLE as well as very mild pain. She has an appointment to follow-up with orthopedics next week. She demonstrates excellent motivation during session today. Continued exercises today with 7.5# ankle weights. Pt continues to require intermittent seated rest breaks and her cough persists. No HEP updates on this date. Plan is to continue progressive RLE strengthening at future visits as pt is able to tolerate. Patient will benefit from skilled PT to address above impairments and improve overall function.Marland Kitchen  REHAB POTENTIAL: Fair    CLINICAL DECISION MAKING: Unstable/unpredictable  EVALUATION COMPLEXITY: High   GOALS: Goals reviewed with patient? Yes  SHORT TERM GOALS:   Pt will be independent with HEP to improve strength and decrease knee pain to improve pain-free function at home. Baseline: 12/11/21: achieved  Goal status: ACHIEVED   LONG TERM GOALS: Target date: 03/13/2022   Pt will increase FOTO to at least 53 to demonstrate significant improvement in function at home and work related to knee pain  Baseline: 11/04/21: 43; 12/11/21: 47; 12/23/21: 53; 01/16/22: 43; 02/19/22: Deferred Goal status: ONGOING  2.  Pt will decrease worst knee pain by at least 3 points on the NPRS in order to demonstrate clinically significant reduction in knee pain. Baseline: 11/04/21: worst: 4/10; 11/29: 5/10; 12/23/21: 1-2/10; 01/16/22: "0.5/10"; 02/19/22: 2/10; Goal status:  PARTIALLY MET  3.  Pt will report no further R knee buckling or falls in order to demonstrate improvement in strength and function and decrease risk for injury related to falls Baseline: 11/29: 1 episode in past week; 12/23/21: 1-2 episodes in the past week; 01/16/22: No episodes of buckling over the last week; 02/19/22: 1 buckling episodes in the past week; Goal status: PARTIALLY MET  PLAN: PT FREQUENCY: 1-2x/week  PT DURATION: 8 weeks  PLANNED INTERVENTIONS: Therapeutic exercises, Therapeutic activity, Neuromuscular re-education, Balance training, Gait training, Patient/Family education, Joint manipulation, Joint mobilization, Canalith repositioning, Aquatic Therapy, Dry Needling, Electrical stimulation, Spinal manipulation, Spinal mobilization, Cryotherapy, Moist heat, Taping, Traction, Ultrasound, Ionotophoresis 4mg /ml Dexamethasone, and Manual therapy  PLAN FOR NEXT SESSION: update FOTO, progress strengthening for RLE, review and modify HEP as necessary  Lyndel Safe Samanvi Cuccia PT, DPT, GCS  Zachory Mangual 02/19/2022, 12:01 PM

## 2022-02-20 NOTE — Therapy (Signed)
OUTPATIENT PHYSICAL THERAPY KNEE TREATMENT  Patient Name: Marilyn Keith MRN: [id number] DOB:12-23-1945, 77 y.o., female Today's Date: 02/24/2022   PT End of Session - 02/24/22 0939     Visit Number 21    Number of Visits 33    Date for PT Re-Evaluation 03/13/22    Authorization Type Medicare primary; med Pay    PT Start Time [phone]    PT Stop Time 1015    PT Time Calculation (min) 41 min    Activity Tolerance Patient tolerated treatment well;No increased pain    Behavior During Therapy WFL for tasks assessed/performed            Past Medical History:  Diagnosis Date   Chronic back pain    GERD (gastroesophageal reflux disease)    Hypertension    Past Surgical History:  Procedure Laterality Date   BACK SURGERY     EYE SURGERY     MASTECTOMY     REPLACEMENT TOTAL KNEE BILATERAL     SHOULDER SURGERY Right    Patient Active Problem List   Diagnosis Date Noted   UTI (urinary tract infection) 08/03/2020   Chronic back pain    Hypertension    GERD (gastroesophageal reflux disease)    PCP: Danae Orleans, MD  REFERRING PROVIDER: Rubbie Battiest, PA*  REFERRING DIAGNOSIS: Pain due to total right knee replacement, initial encounter (CMS-HCC) T84.84XA  Z96.651   THERAPY DIAG: Chronic pain of right knee  Muscle weakness (generalized)  RATIONALE FOR EVALUATION AND TREATMENT: Rehabilitation  ONSET DATE: 08/04/21 (acute on chronic, approximate)  FOLLOW UP APPT WITH PROVIDER: Yes, MD recommended follow-up after at least 4 weeks of PT.   FROM INITIAL EVALUATION SUBJECTIVE:        SUBJECTIVE: Still coughing without progression. Pain remains similar. Pt works on [employer] when she has time throughout the day.                                                                                                                                                              Chief Complaint: Rt knee instability  Pertinent History Pt reports that she fell a few months ago and  states "I think I tore the other ligament in my right knee." She fell down her driveway when taking her recycling can down to the street. She underwent a right total knee arthroplasty about 10-12 years ago at an outside facility in [facility]. Patient relates that surgeon had a difficult time with her surgery as result of the severe valgus osteoarthritis in addition to poor bone quality due to treatments for breast cancer (hx of bilateral mastectomy). 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  She complains of  significant instability in her right knee for at least the past 5 years but symptoms have worsened recently. She has been experiencing repeated dislocations in her R knee recently and has had 3-4 falls in the last 6 months. 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 She arrives wearing a hinged R knee brace and ambulating with a single point cane in RUE. She is also wearing a lace up R ankle brace due to recent severe foot pain for which she saw podiatry. She is using a rollator at all times at home. Pt has a history of chronic neck, low back, and bilateral shoulder pain with significant R shoulder weakness (hx of 3 RTC surgeries RUE).  Precautions: None  Patient Goals: Improve RLE stability  OBJECTIVE:   Patient Surveys  FOTO: 47 (12/11/21)    TODAY'S TREATMENT    SUBJECTIVE: Pt reports she is feeling well today. No stumbles or falls since the last therapy session. She returns to see the orthopedic PA this afternoon. No specific questions currently.   PAIN: Chronic bilateral knee ache, R foot pain, not asked to rate;    Ther-ex  AA/ROM on NuStep L1-3 BLE only for strengthening x 10 minutes with therapist monitoring and adjusting difficulty as appropriate;  Sit to stand with RLE back x 10; Seated LAQ with  7.5# ankle weights (AW) 2 x 35 BLE; Standing heel raises x 20; Forward 6" step-ups with BUE support alternating leading LE x 10 on each side;  Standing exercises with 7.5# AW: Hip flexion marches x 35 BLE; Hip abduction x 35 BLE; Hip extension x 35 BLE; Hip abduction x 35 BLE;   Not performed: Supine RLE quad set 5s hold 2 x 10; Supine RLE SLR with 2# ankle weight (AW) 2 x 10 Supine RLE straight leg hip abduction 2 x 10, with manual resistance;  Supine RLE straight leg hip adduction 2 x 10, with manual resistance; Supine RLE heel slides with manually resisted extension 2 x 10; Hooklying bridges with arms at side 2 x 10; Hooklying adductor squeeze with manual resistance 2 x 10; Hooklying clams with manual resistance 2 x 10; Sit to stand from elevated chair with Airex pad on seat and no UE support x 10; Resisted side stepping with blue tband around knees x 6 lengths in // bars;   PATIENT EDUCATION:  Education details: Pt educated throughout session about proper posture and technique with exercises. Improved exercise technique, movement at target joints, use of target muscles after min to mod verbal, visual, tactile cues.  Person educated: Patient Education method: Explanation, verbal cues, tactile cues Education comprehension: verbalized understanding and returned demonstration   HOME EXERCISE PROGRAM: Access Code: [email or web address] URL: [email or web address] Date: 12/23/2021 Prepared by: Roxana Hires  Exercises - Supine Active Straight Leg Raise  - 1 x daily - 7 x weekly - 2 sets - 10 reps - 3s hold - Supine Quad Set (Mirrored)  - 1 x daily - 7 x weekly - 2 sets - 10 reps - 3s hold - Supine Bridge  - 1 x daily - 7 x weekly - 2 sets - 5 reps - 3s hold - Seated Hip Abduction with Resistance  - 1 x daily - 7 x weekly - 2 sets - 10 reps - 3s hold - Seated Hip Adduction Isometrics with Ball  - 1 x daily - 7 x weekly - 2 sets -  10 reps - 3s hold - Seated Long Arc Quad with  Ankle Weight  - 1 x daily - 7 x weekly - 2 sets - 10 reps - 3s hold - Seated Heel Raise  - 1 x daily - 7 x weekly - 2 sets - 10 reps - 3s hold   ASSESSMENT:  CLINICAL IMPRESSION: Pt demonstrates excellent motivation during session today. Continued exercises today with 7.5# ankle weights. Pt continues to require intermittent seated rest breaks. No HEP updates on this date. She has an appointment to see orthopedics this afternoon. Discussed the possibility of utilizing a locking knee brace and encouraged pt to discuss this with orthopedist. Plan is to continue progressive RLE strengthening at future visits as pt is able to tolerate pending input from the provider this afternoon. Patient will benefit from skilled PT to address above impairments and improve overall function.Marland Kitchen  REHAB POTENTIAL: Fair    CLINICAL DECISION MAKING: Unstable/unpredictable  EVALUATION COMPLEXITY: High   GOALS: Goals reviewed with patient? Yes  SHORT TERM GOALS:   Pt will be independent with HEP to improve strength and decrease knee pain to improve pain-free function at home. Baseline: 12/11/21: achieved  Goal status: ACHIEVED   LONG TERM GOALS: Target date: 03/13/2022   Pt will increase FOTO to at least 53 to demonstrate significant improvement in function at home and work related to knee pain  Baseline: 11/04/21: 43; 12/11/21: 47; 12/23/21: 53; 01/16/22: 43; 02/19/22: Deferred; 02/24/22:  Goal status: ONGOING  2.  Pt will decrease worst knee pain by at least 3 points on the NPRS in order to demonstrate clinically significant reduction in knee pain. Baseline: 11/04/21: worst: 4/10; 11/29: 5/10; 12/23/21: 1-2/10; 01/16/22: "0.5/10"; 02/19/22: 2/10; Goal status: PARTIALLY MET  3.  Pt will report no further R knee buckling or falls in order to demonstrate improvement in strength and function and decrease risk for injury related to falls Baseline: 11/29: 1 episode in past week; 12/23/21: 1-2 episodes in the past week;  01/16/22: No episodes of buckling over the last week; 02/19/22: 1 buckling episodes in the past week; Goal status: PARTIALLY MET  PLAN: PT FREQUENCY: 1-2x/week  PT DURATION: 8 weeks  PLANNED INTERVENTIONS: Therapeutic exercises, Therapeutic activity, Neuromuscular re-education, Balance training, Gait training, Patient/Family education, Joint manipulation, Joint mobilization, Canalith repositioning, Aquatic Therapy, Dry Needling, Electrical stimulation, Spinal manipulation, Spinal mobilization, Cryotherapy, Moist heat, Taping, Traction, Ultrasound, Ionotophoresis 52m/ml Dexamethasone, and Manual therapy  PLAN FOR NEXT SESSION: update FOTO, progress strengthening for RLE, review and modify HEP as necessary  JLyndel SafeHuprich PT, DPT, GCS  Naylene Foell 02/24/2022, 1:10 PM

## 2022-02-24 ENCOUNTER — Ambulatory Visit: Payer: Medicare Other

## 2022-02-24 DIAGNOSIS — G8929 Other chronic pain: Secondary | ICD-10-CM

## 2022-02-24 DIAGNOSIS — M6281 Muscle weakness (generalized): Secondary | ICD-10-CM

## 2022-02-24 DIAGNOSIS — M25561 Pain in right knee: Secondary | ICD-10-CM | POA: Diagnosis not present

## 2022-05-17 IMAGING — CR DG CHEST 2V
2 series · 2 of 2 positions shown · non-contrast
Comparison: None.
COMPARISON: None.

Addendum:
CLINICAL DATA: Cough and shortness of breath for the past 2 weeks.

EXAM:
CHEST - 2 VIEW

[chest pa]
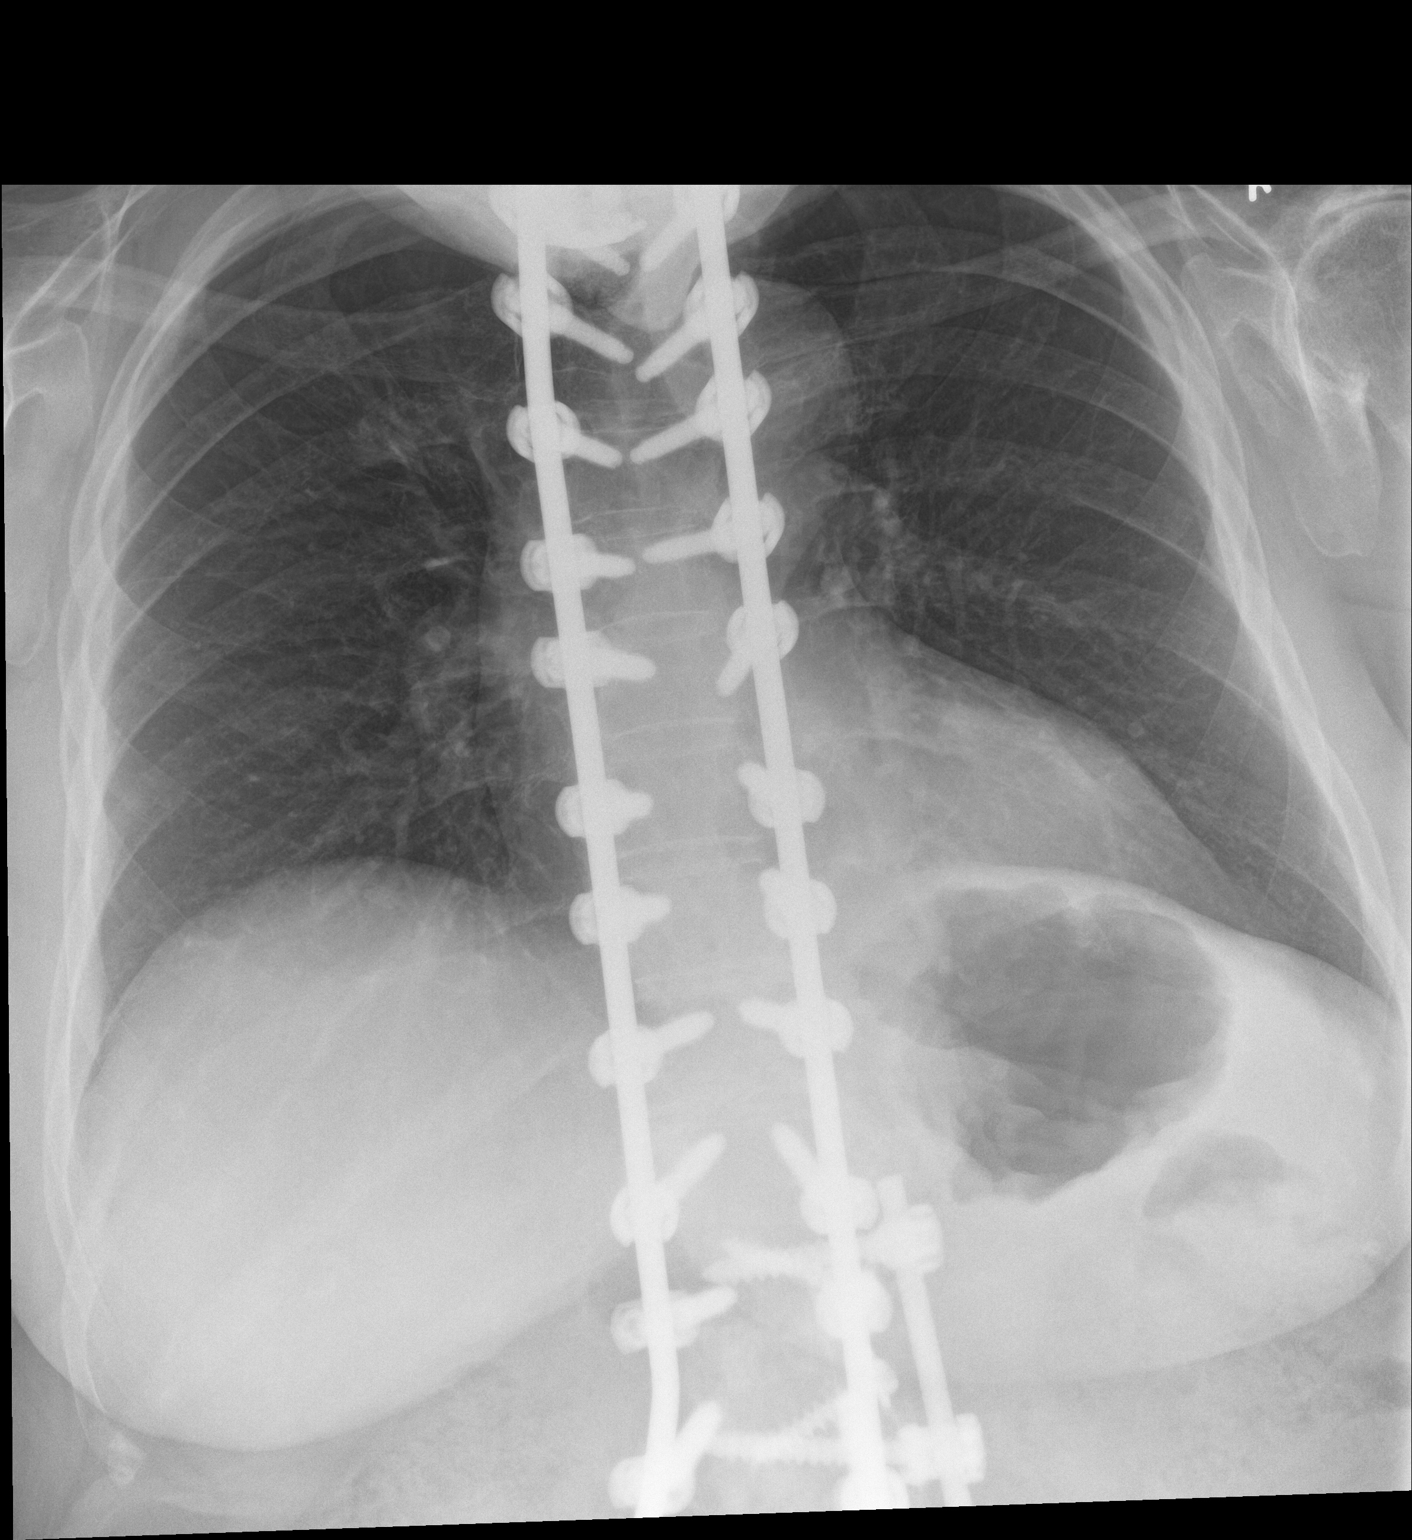

[chest lat]
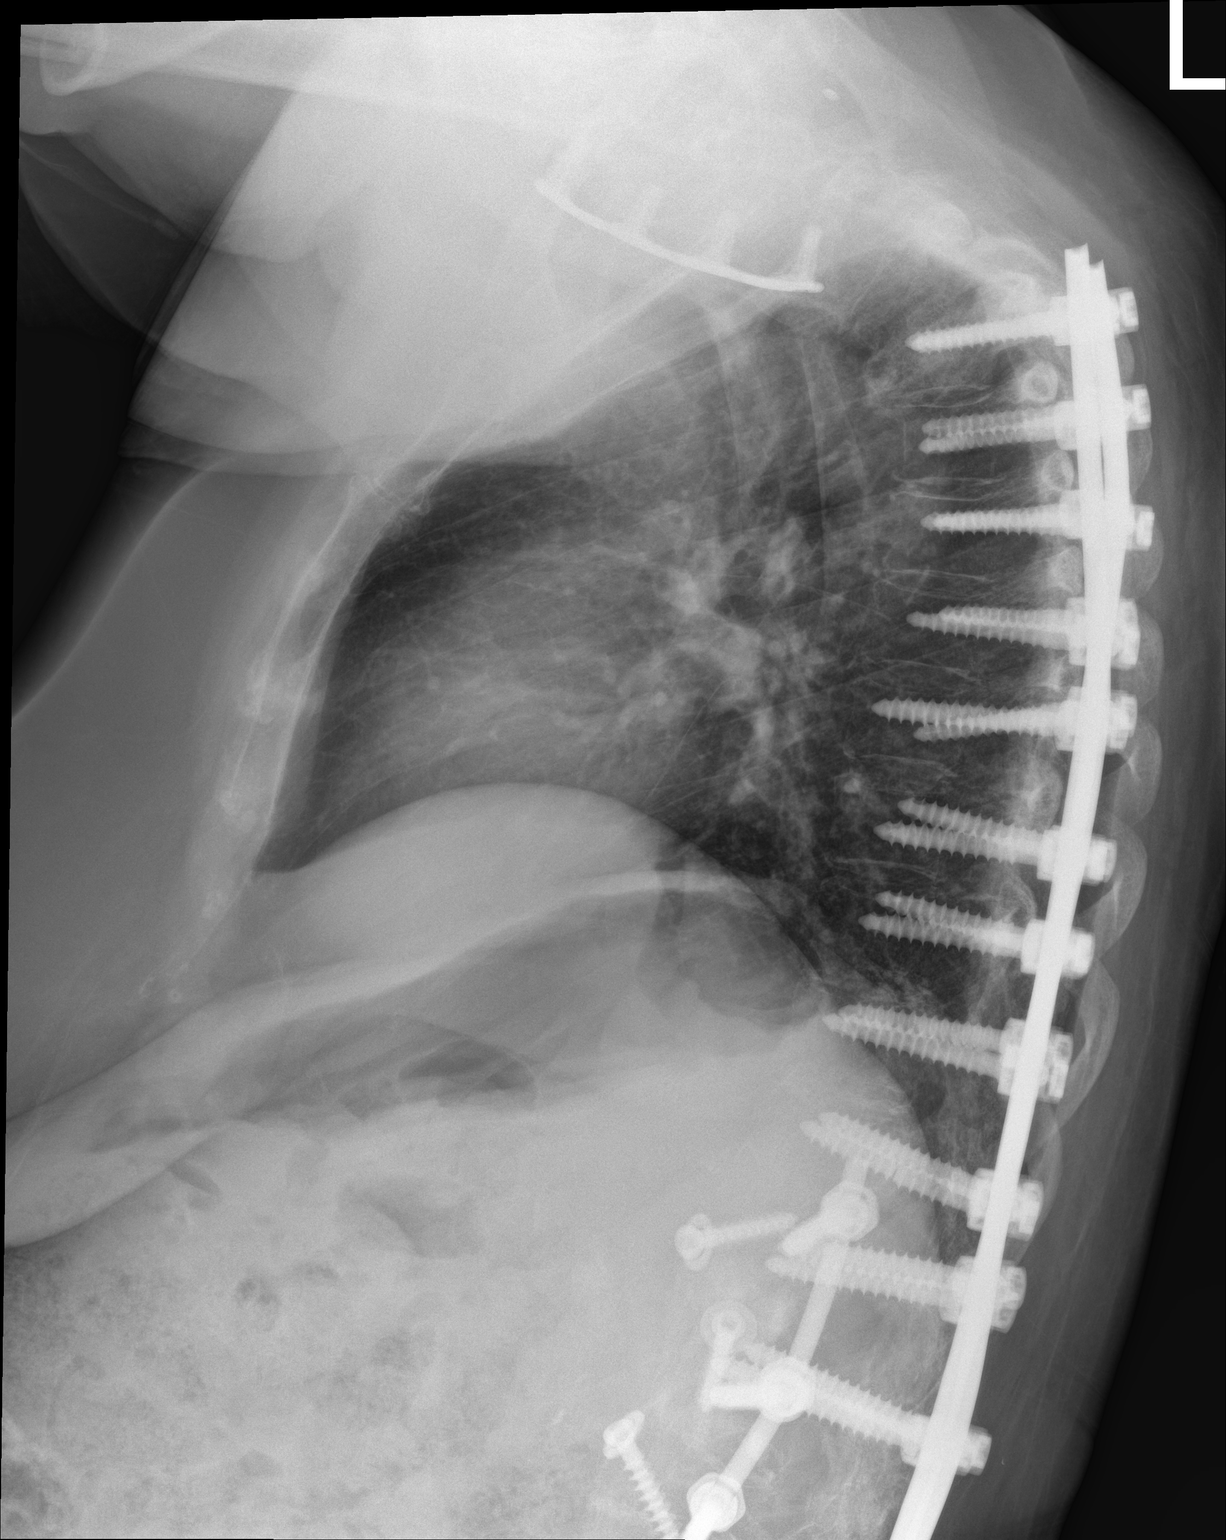

[2 of 2 positions shown; findings below may reference images not displayed]

FINDINGS: Borderline enlarged cardiac silhouette. Clear lungs. The patient's
chin is obscuring the medial lung apices. Extensive spinal fixation
hardware.
IMPRESSION: Acute abnormality.

ADDENDUM:
The impression should read as follows: No acute abnormality.

*** End of Addendum ***
FINDINGS: Borderline enlarged cardiac silhouette. Clear lungs. The patient's
chin is obscuring the medial lung apices. Extensive spinal fixation
hardware.
IMPRESSION: Acute abnormality.
